# Patient Record
Sex: Female | Born: 1995 | Race: White | Hispanic: No | Marital: Married | Smoking: Never smoker
Health system: Southern US, Community
[De-identification: ages and names within clinical notes are randomized; demographics above are authoritative.]

## PROBLEM LIST (undated history)

## (undated) DIAGNOSIS — N809 Endometriosis, unspecified: Secondary | ICD-10-CM

## (undated) DIAGNOSIS — M1990 Unspecified osteoarthritis, unspecified site: Secondary | ICD-10-CM

## (undated) DIAGNOSIS — F32A Depression, unspecified: Secondary | ICD-10-CM

## (undated) DIAGNOSIS — E039 Hypothyroidism, unspecified: Secondary | ICD-10-CM

## (undated) DIAGNOSIS — F419 Anxiety disorder, unspecified: Secondary | ICD-10-CM

## (undated) DIAGNOSIS — N390 Urinary tract infection, site not specified: Secondary | ICD-10-CM

## (undated) DIAGNOSIS — K449 Diaphragmatic hernia without obstruction or gangrene: Secondary | ICD-10-CM

## (undated) DIAGNOSIS — D649 Anemia, unspecified: Secondary | ICD-10-CM

## (undated) DIAGNOSIS — G932 Benign intracranial hypertension: Secondary | ICD-10-CM

## (undated) DIAGNOSIS — F4310 Post-traumatic stress disorder, unspecified: Secondary | ICD-10-CM

## (undated) DIAGNOSIS — K219 Gastro-esophageal reflux disease without esophagitis: Secondary | ICD-10-CM

## (undated) DIAGNOSIS — J45909 Unspecified asthma, uncomplicated: Secondary | ICD-10-CM

## (undated) DIAGNOSIS — I10 Essential (primary) hypertension: Secondary | ICD-10-CM

## (undated) DIAGNOSIS — F909 Attention-deficit hyperactivity disorder, unspecified type: Secondary | ICD-10-CM

## (undated) DIAGNOSIS — T7840XA Allergy, unspecified, initial encounter: Secondary | ICD-10-CM

## (undated) DIAGNOSIS — Z8659 Personal history of other mental and behavioral disorders: Secondary | ICD-10-CM

## (undated) DIAGNOSIS — O139 Gestational [pregnancy-induced] hypertension without significant proteinuria, unspecified trimester: Secondary | ICD-10-CM

## (undated) DIAGNOSIS — L983 Eosinophilic cellulitis [Wells]: Secondary | ICD-10-CM

## (undated) HISTORY — PX: ESOPHAGOGASTRODUODENOSCOPY: SHX1529

## (undated) HISTORY — PX: FINGER SURGERY: SHX640

## (undated) HISTORY — DX: Allergy, unspecified, initial encounter: T78.40XA

## (undated) HISTORY — DX: Depression, unspecified: F32.A

## (undated) HISTORY — DX: Gestational (pregnancy-induced) hypertension without significant proteinuria, unspecified trimester: O13.9

## (undated) HISTORY — PX: DILATION AND CURETTAGE OF UTERUS: SHX78

## (undated) HISTORY — DX: Unspecified osteoarthritis, unspecified site: M19.90

## (undated) HISTORY — PX: COLONOSCOPY: SHX174

## (undated) HISTORY — DX: Post-traumatic stress disorder, unspecified: F43.10

## (undated) HISTORY — DX: Attention-deficit hyperactivity disorder, unspecified type: F90.9

## (undated) HISTORY — DX: Anemia, unspecified: D64.9

## (undated) HISTORY — DX: Personal history of other mental and behavioral disorders: Z86.59

## (undated) HISTORY — DX: Endometriosis, unspecified: N80.9

## (undated) HISTORY — DX: Anxiety disorder, unspecified: F41.9

## (undated) HISTORY — DX: Gastro-esophageal reflux disease without esophagitis: K21.9

## (undated) HISTORY — PX: LUMBAR PUNCTURE: SHX1985

## (undated) HISTORY — PX: LAPAROTOMY: SHX154

## (undated) HISTORY — DX: Unspecified asthma, uncomplicated: J45.909

---
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
## 2019-10-27 DIAGNOSIS — Z1151 Encounter for screening for human papillomavirus (HPV): Secondary | ICD-10-CM | POA: Diagnosis not present

## 2019-10-27 DIAGNOSIS — Z309 Encounter for contraceptive management, unspecified: Secondary | ICD-10-CM | POA: Diagnosis not present

## 2019-10-27 DIAGNOSIS — N898 Other specified noninflammatory disorders of vagina: Secondary | ICD-10-CM | POA: Diagnosis not present

## 2019-10-27 DIAGNOSIS — Z01419 Encounter for gynecological examination (general) (routine) without abnormal findings: Secondary | ICD-10-CM | POA: Diagnosis not present

## 2019-10-30 DIAGNOSIS — N898 Other specified noninflammatory disorders of vagina: Secondary | ICD-10-CM | POA: Diagnosis not present

## 2019-12-13 DIAGNOSIS — J111 Influenza due to unidentified influenza virus with other respiratory manifestations: Secondary | ICD-10-CM | POA: Diagnosis not present

## 2019-12-13 DIAGNOSIS — R5383 Other fatigue: Secondary | ICD-10-CM | POA: Diagnosis not present

## 2019-12-13 DIAGNOSIS — J029 Acute pharyngitis, unspecified: Secondary | ICD-10-CM | POA: Diagnosis not present

## 2019-12-13 DIAGNOSIS — B349 Viral infection, unspecified: Secondary | ICD-10-CM | POA: Diagnosis not present

## 2019-12-13 DIAGNOSIS — Z20828 Contact with and (suspected) exposure to other viral communicable diseases: Secondary | ICD-10-CM | POA: Diagnosis not present

## 2019-12-23 DIAGNOSIS — J9811 Atelectasis: Secondary | ICD-10-CM | POA: Diagnosis not present

## 2019-12-23 DIAGNOSIS — R05 Cough: Secondary | ICD-10-CM | POA: Diagnosis not present

## 2019-12-23 DIAGNOSIS — R918 Other nonspecific abnormal finding of lung field: Secondary | ICD-10-CM | POA: Diagnosis not present

## 2019-12-23 DIAGNOSIS — R509 Fever, unspecified: Secondary | ICD-10-CM | POA: Diagnosis not present

## 2019-12-23 DIAGNOSIS — M5489 Other dorsalgia: Secondary | ICD-10-CM | POA: Diagnosis not present

## 2019-12-27 DIAGNOSIS — N949 Unspecified condition associated with female genital organs and menstrual cycle: Secondary | ICD-10-CM | POA: Diagnosis not present

## 2019-12-27 DIAGNOSIS — R8271 Bacteriuria: Secondary | ICD-10-CM | POA: Diagnosis not present

## 2019-12-27 DIAGNOSIS — R918 Other nonspecific abnormal finding of lung field: Secondary | ICD-10-CM | POA: Diagnosis not present

## 2020-01-30 DIAGNOSIS — R05 Cough: Secondary | ICD-10-CM | POA: Diagnosis not present

## 2020-01-30 DIAGNOSIS — M549 Dorsalgia, unspecified: Secondary | ICD-10-CM | POA: Diagnosis not present

## 2020-01-30 DIAGNOSIS — Z131 Encounter for screening for diabetes mellitus: Secondary | ICD-10-CM | POA: Diagnosis not present

## 2020-01-30 DIAGNOSIS — R002 Palpitations: Secondary | ICD-10-CM | POA: Diagnosis not present

## 2020-01-30 DIAGNOSIS — R0602 Shortness of breath: Secondary | ICD-10-CM | POA: Diagnosis not present

## 2020-02-16 DIAGNOSIS — N898 Other specified noninflammatory disorders of vagina: Secondary | ICD-10-CM | POA: Diagnosis not present

## 2020-02-16 DIAGNOSIS — R309 Painful micturition, unspecified: Secondary | ICD-10-CM | POA: Diagnosis not present

## 2020-08-01 DIAGNOSIS — R509 Fever, unspecified: Secondary | ICD-10-CM | POA: Diagnosis not present

## 2020-08-15 DIAGNOSIS — N925 Other specified irregular menstruation: Secondary | ICD-10-CM | POA: Diagnosis not present

## 2020-08-15 DIAGNOSIS — E039 Hypothyroidism, unspecified: Secondary | ICD-10-CM | POA: Diagnosis not present

## 2020-08-15 DIAGNOSIS — R110 Nausea: Secondary | ICD-10-CM | POA: Diagnosis not present

## 2020-08-15 DIAGNOSIS — Z331 Pregnant state, incidental: Secondary | ICD-10-CM | POA: Diagnosis not present

## 2020-09-03 DIAGNOSIS — O99211 Obesity complicating pregnancy, first trimester: Secondary | ICD-10-CM | POA: Diagnosis not present

## 2020-09-04 DIAGNOSIS — Z3401 Encounter for supervision of normal first pregnancy, first trimester: Secondary | ICD-10-CM | POA: Diagnosis not present

## 2020-09-04 DIAGNOSIS — Z3687 Encounter for antenatal screening for uncertain dates: Secondary | ICD-10-CM | POA: Diagnosis not present

## 2020-09-04 DIAGNOSIS — O99211 Obesity complicating pregnancy, first trimester: Secondary | ICD-10-CM | POA: Diagnosis not present

## 2020-09-04 DIAGNOSIS — Z3A01 Less than 8 weeks gestation of pregnancy: Secondary | ICD-10-CM | POA: Diagnosis not present

## 2020-09-04 DIAGNOSIS — O200 Threatened abortion: Secondary | ICD-10-CM | POA: Diagnosis not present

## 2020-09-06 DIAGNOSIS — O200 Threatened abortion: Secondary | ICD-10-CM | POA: Diagnosis not present

## 2020-09-11 DIAGNOSIS — O200 Threatened abortion: Secondary | ICD-10-CM | POA: Diagnosis not present

## 2020-09-11 DIAGNOSIS — Z3687 Encounter for antenatal screening for uncertain dates: Secondary | ICD-10-CM | POA: Diagnosis not present

## 2020-09-11 DIAGNOSIS — O020 Blighted ovum and nonhydatidiform mole: Secondary | ICD-10-CM | POA: Diagnosis not present

## 2020-09-11 DIAGNOSIS — O021 Missed abortion: Secondary | ICD-10-CM | POA: Diagnosis not present

## 2020-09-12 DIAGNOSIS — Z01818 Encounter for other preprocedural examination: Secondary | ICD-10-CM | POA: Diagnosis not present

## 2020-09-12 DIAGNOSIS — Z20822 Contact with and (suspected) exposure to covid-19: Secondary | ICD-10-CM | POA: Diagnosis not present

## 2020-09-12 DIAGNOSIS — O020 Blighted ovum and nonhydatidiform mole: Secondary | ICD-10-CM | POA: Diagnosis not present

## 2020-09-12 DIAGNOSIS — O021 Missed abortion: Secondary | ICD-10-CM | POA: Diagnosis not present

## 2020-09-12 DIAGNOSIS — Z3A01 Less than 8 weeks gestation of pregnancy: Secondary | ICD-10-CM | POA: Diagnosis not present

## 2020-09-13 DIAGNOSIS — Z01818 Encounter for other preprocedural examination: Secondary | ICD-10-CM | POA: Diagnosis not present

## 2020-09-13 DIAGNOSIS — Z20822 Contact with and (suspected) exposure to covid-19: Secondary | ICD-10-CM | POA: Diagnosis not present

## 2020-09-13 DIAGNOSIS — I10 Essential (primary) hypertension: Secondary | ICD-10-CM | POA: Diagnosis not present

## 2020-09-13 DIAGNOSIS — O029 Abnormal product of conception, unspecified: Secondary | ICD-10-CM | POA: Diagnosis not present

## 2020-09-13 DIAGNOSIS — E669 Obesity, unspecified: Secondary | ICD-10-CM | POA: Diagnosis not present

## 2020-09-13 DIAGNOSIS — O021 Missed abortion: Secondary | ICD-10-CM | POA: Diagnosis not present

## 2020-09-13 DIAGNOSIS — Z3A01 Less than 8 weeks gestation of pregnancy: Secondary | ICD-10-CM | POA: Diagnosis not present

## 2020-09-17 DIAGNOSIS — Y92410 Unspecified street and highway as the place of occurrence of the external cause: Secondary | ICD-10-CM | POA: Diagnosis not present

## 2020-09-17 DIAGNOSIS — R52 Pain, unspecified: Secondary | ICD-10-CM | POA: Diagnosis not present

## 2020-09-17 DIAGNOSIS — R1084 Generalized abdominal pain: Secondary | ICD-10-CM | POA: Diagnosis not present

## 2020-09-17 DIAGNOSIS — M25522 Pain in left elbow: Secondary | ICD-10-CM | POA: Diagnosis not present

## 2020-09-17 DIAGNOSIS — M25561 Pain in right knee: Secondary | ICD-10-CM | POA: Diagnosis not present

## 2020-09-19 DIAGNOSIS — R1030 Lower abdominal pain, unspecified: Secondary | ICD-10-CM | POA: Diagnosis not present

## 2020-10-07 DIAGNOSIS — M25562 Pain in left knee: Secondary | ICD-10-CM | POA: Diagnosis not present

## 2020-10-07 DIAGNOSIS — M545 Low back pain, unspecified: Secondary | ICD-10-CM | POA: Diagnosis not present

## 2020-10-07 DIAGNOSIS — S3992XD Unspecified injury of lower back, subsequent encounter: Secondary | ICD-10-CM | POA: Diagnosis not present

## 2020-10-23 DIAGNOSIS — N946 Dysmenorrhea, unspecified: Secondary | ICD-10-CM | POA: Diagnosis not present

## 2020-10-23 DIAGNOSIS — N939 Abnormal uterine and vaginal bleeding, unspecified: Secondary | ICD-10-CM | POA: Diagnosis not present

## 2020-10-27 DIAGNOSIS — T63301A Toxic effect of unspecified spider venom, accidental (unintentional), initial encounter: Secondary | ICD-10-CM | POA: Diagnosis not present

## 2020-10-27 DIAGNOSIS — Z9181 History of falling: Secondary | ICD-10-CM | POA: Diagnosis not present

## 2020-11-07 DIAGNOSIS — Z136 Encounter for screening for cardiovascular disorders: Secondary | ICD-10-CM | POA: Diagnosis not present

## 2020-11-07 DIAGNOSIS — M545 Low back pain, unspecified: Secondary | ICD-10-CM | POA: Diagnosis not present

## 2020-11-07 DIAGNOSIS — M542 Cervicalgia: Secondary | ICD-10-CM | POA: Diagnosis not present

## 2020-11-07 DIAGNOSIS — F418 Other specified anxiety disorders: Secondary | ICD-10-CM | POA: Diagnosis not present

## 2020-11-07 DIAGNOSIS — Z131 Encounter for screening for diabetes mellitus: Secondary | ICD-10-CM | POA: Diagnosis not present

## 2020-11-07 DIAGNOSIS — M546 Pain in thoracic spine: Secondary | ICD-10-CM | POA: Diagnosis not present

## 2020-12-17 DIAGNOSIS — O99211 Obesity complicating pregnancy, first trimester: Secondary | ICD-10-CM | POA: Diagnosis not present

## 2020-12-17 DIAGNOSIS — N912 Amenorrhea, unspecified: Secondary | ICD-10-CM | POA: Diagnosis not present

## 2020-12-28 ENCOUNTER — Other Ambulatory Visit: Payer: Self-pay

## 2020-12-28 ENCOUNTER — Inpatient Hospital Stay (HOSPITAL_COMMUNITY)
Admission: AD | Admit: 2020-12-28 | Discharge: 2020-12-28 | Disposition: A | Discharge status: Home / Self Care | Payer: BC Managed Care – PPO | Attending: Obstetrics and Gynecology | Admitting: Obstetrics and Gynecology

## 2020-12-28 ENCOUNTER — Encounter (HOSPITAL_COMMUNITY): Payer: Self-pay | Admitting: Obstetrics and Gynecology

## 2020-12-28 ENCOUNTER — Inpatient Hospital Stay (HOSPITAL_COMMUNITY): Payer: BC Managed Care – PPO

## 2020-12-28 DIAGNOSIS — O99891 Other specified diseases and conditions complicating pregnancy: Secondary | ICD-10-CM | POA: Diagnosis not present

## 2020-12-28 DIAGNOSIS — R109 Unspecified abdominal pain: Secondary | ICD-10-CM | POA: Insufficient documentation

## 2020-12-28 DIAGNOSIS — O99281 Endocrine, nutritional and metabolic diseases complicating pregnancy, first trimester: Secondary | ICD-10-CM | POA: Insufficient documentation

## 2020-12-28 DIAGNOSIS — O3680X0 Pregnancy with inconclusive fetal viability, not applicable or unspecified: Secondary | ICD-10-CM

## 2020-12-28 DIAGNOSIS — N854 Malposition of uterus: Secondary | ICD-10-CM | POA: Diagnosis not present

## 2020-12-28 DIAGNOSIS — O26851 Spotting complicating pregnancy, first trimester: Secondary | ICD-10-CM | POA: Insufficient documentation

## 2020-12-28 DIAGNOSIS — Z79899 Other long term (current) drug therapy: Secondary | ICD-10-CM | POA: Insufficient documentation

## 2020-12-28 DIAGNOSIS — Z3A01 Less than 8 weeks gestation of pregnancy: Secondary | ICD-10-CM | POA: Diagnosis not present

## 2020-12-28 DIAGNOSIS — O209 Hemorrhage in early pregnancy, unspecified: Secondary | ICD-10-CM | POA: Diagnosis not present

## 2020-12-28 DIAGNOSIS — E039 Hypothyroidism, unspecified: Secondary | ICD-10-CM | POA: Diagnosis not present

## 2020-12-28 HISTORY — DX: Eosinophilic cellulitis (wells): L98.3

## 2020-12-28 HISTORY — DX: Benign intracranial hypertension: G93.2

## 2020-12-28 HISTORY — DX: Hypothyroidism, unspecified: E03.9

## 2020-12-28 LAB — URINALYSIS, ROUTINE W REFLEX MICROSCOPIC
Bilirubin Urine: NEGATIVE
Glucose, UA: NEGATIVE mg/dL
Ketones, ur: NEGATIVE mg/dL
Leukocytes,Ua: NEGATIVE
Nitrite: NEGATIVE
Protein, ur: NEGATIVE mg/dL
Specific Gravity, Urine: >1.03 — ABNORMAL HIGH (ref 1.005–1.030)
pH: 5.5 (ref 5.0–8.0)

## 2020-12-28 LAB — CBC
HCT: 39.1 % (ref 36.0–46.0)
Hemoglobin: 13.2 g/dL (ref 12.0–15.0)
MCH: 30.1 pg (ref 26.0–34.0)
MCHC: 33.8 g/dL (ref 30.0–36.0)
MCV: 89.1 fL (ref 80.0–100.0)
Platelets: 346 10*3/uL (ref 150–400)
RBC: 4.39 MIL/uL (ref 3.87–5.11)
RDW: 12.8 % (ref 11.5–15.5)
WBC: 7.8 10*3/uL (ref 4.0–10.5)
nRBC: 0 % (ref 0.0–0.2)

## 2020-12-28 LAB — WET PREP, GENITAL
Clue Cells Wet Prep HPF POC: NONE SEEN
Sperm: NONE SEEN
Trich, Wet Prep: NONE SEEN
Yeast Wet Prep HPF POC: NONE SEEN

## 2020-12-28 LAB — URINALYSIS, MICROSCOPIC (REFLEX)

## 2020-12-28 LAB — POCT PREGNANCY, URINE: Preg Test, Ur: POSITIVE — AB

## 2020-12-28 LAB — HCG, QUANTITATIVE, PREGNANCY: hCG, Beta Chain, Quant, S: 13453 m[IU]/mL — ABNORMAL HIGH (ref <5)

## 2020-12-28 MED ORDER — ACETAMINOPHEN 500 MG PO TABS
1000.0000 mg | ORAL_TABLET | Freq: Once | ORAL | Status: AC
  Administered 2020-12-28: 1000 mg via ORAL
  Filled 2020-12-28: qty 2

## 2020-12-28 NOTE — MAU Provider Note (Signed)
History     CSN: [id number]  Arrival date and time: 12/28/20 0200   Event Date/Time   First Provider Initiated Contact with Patient 12/28/20 0249      Chief Complaint  Patient presents with   Vaginal Bleeding   Abdominal Pain   Marcia Gonzalez is a 25 y.o. G2P0010 at [redacted]w[redacted]d by Unsure LMP of April 27th, 2022 who receives care at [facility] in [place].  She presents today for Vaginal Bleeding and Abdominal Pain.  She states she has been cramping for about one week and states the pain is intermittent and located in the lower abdominal area-bilaterally.  Patient rates the pain at 3 out of 10.  She states she started having bleeding yesterday initially was spotting and only noted with wiping, but has since increased and is now her underwear.  She denies sexual activity in the past 3 days.  She denies issues with urination or vaginal discharge prior to the bleeding.  She does endorse diarrhea in the form of loose stools, however she has not had one since Thursday.   OB History     Gravida  2   Para      Term      Preterm      AB  1   Living         SAB  1   IAB      Ectopic      Multiple      Live Births              Past Medical History:  Diagnosis Date   Hypothyroidism    IIH (idiopathic intracranial hypertension)    Wells' syndrome     Past Surgical History:  Procedure Laterality Date   DILATION AND CURETTAGE OF UTERUS     FINGER SURGERY      No family history on file.     Allergies:  Allergies  Allergen Reactions   Codeine Nausea Only   Augmentin [Amoxicillin-Pot Clavulanate] Other (See Comments)    Hallucinations    Medications Prior to Admission  Medication Sig Dispense Refill Last Dose   buPROPion (WELLBUTRIN) 75 MG tablet Take 75 mg by mouth daily.      escitalopram (LEXAPRO) 10 MG tablet Take 10 mg by mouth daily.      ferrous sulfate 325 (65 FE) MG tablet Take 325 mg by mouth daily with breakfast.      folic acid (FOLVITE) 1 MG  tablet Take 1 mg by mouth daily.      levothyroxine (SYNTHROID) 88 MCG tablet Take 88 mcg by mouth daily before breakfast.   12/27/2020   liothyronine (CYTOMEL) 5 MCG tablet Take 5 mcg by mouth daily.      Prenatal Vit-Fe Fumarate-FA (PRENATAL MULTIVITAMIN) TABS tablet Take 1 tablet by mouth daily at 12 noon.       Review of Systems  Gastrointestinal:  Positive for abdominal pain (Cramping) and diarrhea (Loose-None currently). Negative for constipation, nausea and vomiting.  Genitourinary:  Positive for vaginal bleeding (Spotting). Negative for difficulty urinating, dysuria and vaginal discharge.  Musculoskeletal:  Positive for back pain.  Neurological:  Negative for dizziness, light-headedness and headaches.  Physical Exam   Blood pressure 116/68, pulse 92, temperature 97.6 F (36.4 C), resp. rate 18, height 5\' 6"  (1.676 m), weight 106.6 kg, last menstrual period 11/13/2020.  Physical Exam Vitals reviewed.  Constitutional:      General: She is not in acute distress.    Appearance: Normal appearance.  She is well-developed. She is obese. She is not ill-appearing.  HENT:     Head: Normocephalic and atraumatic.  Eyes:     Conjunctiva/sclera: Conjunctivae normal.  Cardiovascular:     Rate and Rhythm: Normal rate.  Pulmonary:     Effort: Pulmonary effort is normal. No respiratory distress.  Genitourinary:    Comments: Patient self swab for collection of cultures.  Musculoskeletal:     Cervical back: Normal range of motion.  Skin:    General: Skin is warm and dry.  Neurological:     Mental Status: She is alert and oriented to person, place, and time.  Psychiatric:        Mood and Affect: Mood normal.        Behavior: Behavior normal.        Thought Content: Thought content normal.    MAU Course  Procedures Results for orders placed or performed during the hospital encounter of 12/28/20 (from the past 24 hour(s))  Urinalysis, Routine w reflex microscopic Urine, Clean Catch      Status: Abnormal   Collection Time: 12/28/20  2:22 AM  Result Value Ref Range   Color, Urine YELLOW YELLOW   APPearance HAZY (A) CLEAR   Specific Gravity, Urine >1.030 (H) 1.005 - 1.030   pH 5.5 5.0 - 8.0   Glucose, UA NEGATIVE NEGATIVE mg/dL   Hgb urine dipstick MODERATE (A) NEGATIVE   Bilirubin Urine NEGATIVE NEGATIVE   Ketones, ur NEGATIVE NEGATIVE mg/dL   Protein, ur NEGATIVE NEGATIVE mg/dL   Nitrite NEGATIVE NEGATIVE   Leukocytes,Ua NEGATIVE NEGATIVE  Urinalysis, Microscopic (reflex)     Status: Abnormal   Collection Time: 12/28/20  2:22 AM  Result Value Ref Range   RBC / HPF 0-5 0 - 5 RBC/hpf   WBC, UA 0-5 0 - 5 WBC/hpf   Bacteria, UA FEW (A) NONE SEEN   Squamous Epithelial / LPF 6-10 0 - 5   Mucus PRESENT    Ca Oxalate Crys, UA PRESENT   Pregnancy, urine POC     Status: Abnormal   Collection Time: 12/28/20  2:29 AM  Result Value Ref Range   Preg Test, Ur POSITIVE (A) NEGATIVE  CBC     Status: None   Collection Time: 12/28/20  3:14 AM  Result Value Ref Range   WBC 7.8 4.0 - 10.5 K/uL   RBC 4.39 3.87 - 5.11 MIL/uL   Hemoglobin 13.2 12.0 - 15.0 g/dL   HCT 27.7 82.4 - 23.5 %   MCV 89.1 80.0 - 100.0 fL   MCH 30.1 26.0 - 34.0 pg   MCHC 33.8 30.0 - 36.0 g/dL   RDW 36.1 44.3 - 15.4 %   Platelets 346 150 - 400 K/uL   nRBC 0.0 0.0 - 0.2 %  hCG, quantitative, pregnancy     Status: Abnormal   Collection Time: 12/28/20  3:14 AM  Result Value Ref Range   hCG, Beta Chain, Quant, S 13,453 (H) <5 mIU/mL  ABO/Rh     Status: None   Collection Time: 12/28/20  3:14 AM  Result Value Ref Range   ABO/RH(D)      O POS Performed at [facility], [address] [place], [place] [address]   Wet prep, genital     Status: Abnormal   Collection Time: 12/28/20  3:32 AM   Specimen: PATH Cytology Cervicovaginal Ancillary Only  Result Value Ref Range   Yeast Wet Prep HPF POC NONE SEEN NONE SEEN   Trich,  Wet Prep NONE SEEN NONE SEEN   Clue Cells Wet Prep HPF POC NONE SEEN NONE SEEN    WBC, Wet Prep HPF POC MANY (A) NONE SEEN   Sperm NONE SEEN    US OB LESS THAN 14 WEEKS WITH OB TRANSVAGINAL  Result Date: 12/28/2020 CLINICAL DATA:  Cramps for 1 week, spotting EXAM: OBSTETRIC <14 WK [place] AND TRANSVAGINAL OB US TECHNIQUE: Both transabdominal and transvaginal ultrasound examinations were performed for complete evaluation of the gestation as well as the maternal uterus, adnexal regions, and pelvic cul-de-sac. Transvaginal technique was performed to assess early pregnancy. COMPARISON:  None. FINDINGS: Intrauterine gestational sac: Single Yolk sac:  Not Visualized. Embryo:  Not Visualized. Cardiac Activity: Not Visualized. MSD: 13 mm   6 w   1 d Subchorionic hemorrhage:  None visualized. Maternal uterus/adnexae: Focal fluid collection within the endometrium likely reflecting early gestational sac. Retroflexed uterus. Challenging visualization of the pelvic contents due to extensive bowel gas with nonvisualization of the ovaries. No discernible pelvic free fluid. IMPRESSION: Probable early intrauterine gestational sac, but no yolk sac, fetal pole, or cardiac activity yet visualized. Recommend follow-up quantitative B-HCG levels and follow-up US in 14 days to assess viability. This recommendation follows SRU consensus guidelines: Diagnostic Criteria for Nonviable Pregnancy Early in the First Trimester. Malva Limes Med 2013; 643:3295-18. Nonvisualization of the ovaries. Retroflexed uterus. Electronically Signed   By: Kreg Shropshire M.D.   On: 12/28/2020 03:45    MDM Pelvic Exam; Wet Prep and GC/CT Labs: UA, UPT, CBC, hCG, ABO Ultrasound Analgesic Assessment and Plan  25 year old G2P0010 at 6.3 weeks Spotting Abdominal Pain  -Reviewed POC with patient. -Patient to self swab for cultures. -Patient offered and declined pain medication. -Labs ordered and collected. -We will send for ultrasound results.   Cherre Robins 12/28/2020, 2:49 AM   Reassessment (4:47 AM) IUGS    -Ultrasound  findings as above. -Quant [place]. -Provider to bedside to discuss results. -Patient informed of need for follow-up appointment in 48 hours. -Patient expressed desire to follow-up with primary OB at [facility]. -Patient and significant other questions and concerns addressed. -Bleeding precautions given -Encouraged to call or return to MAU if symptoms worsen or with the onset of new symptoms. -Discharged to home in stable condition.  Cherre Robins .cre

## 2020-12-28 NOTE — MAU Note (Signed)
I had a miscarriage in the past with D&C. Have had back pain and abd cramping for a wk. Diarrhea for 3wks since finding out was pregnant. Spotting off and on this wk which has gotten alittle heavier. Occ n/v. Just want to be sure everything is ok.

## 2020-12-30 DIAGNOSIS — O200 Threatened abortion: Secondary | ICD-10-CM | POA: Diagnosis not present

## 2020-12-30 DIAGNOSIS — R112 Nausea with vomiting, unspecified: Secondary | ICD-10-CM | POA: Diagnosis not present

## 2020-12-30 LAB — GC/CHLAMYDIA PROBE AMP (~~LOC~~) NOT AT ARMC
Chlamydia: NEGATIVE
Comment: NEGATIVE
Comment: NORMAL
Neisseria Gonorrhea: NEGATIVE

## 2020-12-30 LAB — ABO/RH: ABO/RH(D): O POS

## 2021-01-01 DIAGNOSIS — O200 Threatened abortion: Secondary | ICD-10-CM | POA: Diagnosis not present

## 2021-01-05 DIAGNOSIS — Z8759 Personal history of other complications of pregnancy, childbirth and the puerperium: Secondary | ICD-10-CM | POA: Diagnosis not present

## 2021-01-05 DIAGNOSIS — N939 Abnormal uterine and vaginal bleeding, unspecified: Secondary | ICD-10-CM | POA: Diagnosis not present

## 2021-01-05 DIAGNOSIS — Z3A01 Less than 8 weeks gestation of pregnancy: Secondary | ICD-10-CM | POA: Diagnosis not present

## 2021-01-05 DIAGNOSIS — N938 Other specified abnormal uterine and vaginal bleeding: Secondary | ICD-10-CM | POA: Diagnosis not present

## 2021-01-05 DIAGNOSIS — R1030 Lower abdominal pain, unspecified: Secondary | ICD-10-CM | POA: Diagnosis not present

## 2021-01-05 DIAGNOSIS — O034 Incomplete spontaneous abortion without complication: Secondary | ICD-10-CM | POA: Diagnosis not present

## 2021-01-05 DIAGNOSIS — O209 Hemorrhage in early pregnancy, unspecified: Secondary | ICD-10-CM | POA: Diagnosis not present

## 2021-01-05 DIAGNOSIS — R102 Pelvic and perineal pain: Secondary | ICD-10-CM | POA: Diagnosis not present

## 2021-01-05 DIAGNOSIS — O26891 Other specified pregnancy related conditions, first trimester: Secondary | ICD-10-CM | POA: Diagnosis not present

## 2021-01-05 DIAGNOSIS — O99281 Endocrine, nutritional and metabolic diseases complicating pregnancy, first trimester: Secondary | ICD-10-CM | POA: Diagnosis not present

## 2021-01-05 DIAGNOSIS — R7989 Other specified abnormal findings of blood chemistry: Secondary | ICD-10-CM | POA: Diagnosis not present

## 2021-01-05 DIAGNOSIS — R55 Syncope and collapse: Secondary | ICD-10-CM | POA: Diagnosis not present

## 2021-01-05 DIAGNOSIS — R457 State of emotional shock and stress, unspecified: Secondary | ICD-10-CM | POA: Diagnosis not present

## 2021-01-05 DIAGNOSIS — M5459 Other low back pain: Secondary | ICD-10-CM | POA: Diagnosis not present

## 2021-01-05 DIAGNOSIS — O99891 Other specified diseases and conditions complicating pregnancy: Secondary | ICD-10-CM | POA: Diagnosis not present

## 2021-01-05 DIAGNOSIS — Z20822 Contact with and (suspected) exposure to covid-19: Secondary | ICD-10-CM | POA: Diagnosis not present

## 2021-01-05 DIAGNOSIS — O3680X0 Pregnancy with inconclusive fetal viability, not applicable or unspecified: Secondary | ICD-10-CM | POA: Diagnosis not present

## 2021-01-05 DIAGNOSIS — O039 Complete or unspecified spontaneous abortion without complication: Secondary | ICD-10-CM | POA: Diagnosis not present

## 2021-01-05 DIAGNOSIS — E876 Hypokalemia: Secondary | ICD-10-CM | POA: Diagnosis not present

## 2021-01-05 DIAGNOSIS — R0689 Other abnormalities of breathing: Secondary | ICD-10-CM | POA: Diagnosis not present

## 2021-01-05 DIAGNOSIS — Z3A00 Weeks of gestation of pregnancy not specified: Secondary | ICD-10-CM | POA: Diagnosis not present

## 2021-01-05 DIAGNOSIS — Z3201 Encounter for pregnancy test, result positive: Secondary | ICD-10-CM | POA: Diagnosis not present

## 2021-01-05 DIAGNOSIS — R000 Tachycardia, unspecified: Secondary | ICD-10-CM | POA: Diagnosis not present

## 2021-01-06 DIAGNOSIS — R0689 Other abnormalities of breathing: Secondary | ICD-10-CM | POA: Diagnosis not present

## 2021-01-06 DIAGNOSIS — O029 Abnormal product of conception, unspecified: Secondary | ICD-10-CM | POA: Diagnosis not present

## 2021-01-06 DIAGNOSIS — O034 Incomplete spontaneous abortion without complication: Secondary | ICD-10-CM | POA: Diagnosis not present

## 2021-01-06 DIAGNOSIS — O039 Complete or unspecified spontaneous abortion without complication: Secondary | ICD-10-CM | POA: Diagnosis not present

## 2021-01-06 DIAGNOSIS — F32A Depression, unspecified: Secondary | ICD-10-CM | POA: Diagnosis not present

## 2021-01-06 DIAGNOSIS — R102 Pelvic and perineal pain: Secondary | ICD-10-CM | POA: Diagnosis not present

## 2021-01-06 DIAGNOSIS — R000 Tachycardia, unspecified: Secondary | ICD-10-CM | POA: Diagnosis not present

## 2021-01-06 DIAGNOSIS — R109 Unspecified abdominal pain: Secondary | ICD-10-CM | POA: Diagnosis not present

## 2021-01-07 DIAGNOSIS — R102 Pelvic and perineal pain: Secondary | ICD-10-CM | POA: Diagnosis not present

## 2021-01-07 DIAGNOSIS — O034 Incomplete spontaneous abortion without complication: Secondary | ICD-10-CM | POA: Diagnosis not present

## 2021-01-17 DIAGNOSIS — R102 Pelvic and perineal pain: Secondary | ICD-10-CM | POA: Diagnosis not present

## 2021-01-17 DIAGNOSIS — N946 Dysmenorrhea, unspecified: Secondary | ICD-10-CM | POA: Diagnosis not present

## 2021-01-31 DIAGNOSIS — N96 Recurrent pregnancy loss: Secondary | ICD-10-CM | POA: Diagnosis not present

## 2021-01-31 DIAGNOSIS — R102 Pelvic and perineal pain: Secondary | ICD-10-CM | POA: Diagnosis not present

## 2021-01-31 DIAGNOSIS — N946 Dysmenorrhea, unspecified: Secondary | ICD-10-CM | POA: Diagnosis not present

## 2021-03-14 DIAGNOSIS — Z01818 Encounter for other preprocedural examination: Secondary | ICD-10-CM | POA: Diagnosis not present

## 2021-03-14 DIAGNOSIS — N96 Recurrent pregnancy loss: Secondary | ICD-10-CM | POA: Diagnosis not present

## 2021-03-14 DIAGNOSIS — R102 Pelvic and perineal pain: Secondary | ICD-10-CM | POA: Diagnosis not present

## 2021-03-14 DIAGNOSIS — N946 Dysmenorrhea, unspecified: Secondary | ICD-10-CM | POA: Diagnosis not present

## 2021-03-17 DIAGNOSIS — R102 Pelvic and perineal pain: Secondary | ICD-10-CM | POA: Diagnosis not present

## 2021-03-17 DIAGNOSIS — Z01812 Encounter for preprocedural laboratory examination: Secondary | ICD-10-CM | POA: Diagnosis not present

## 2021-03-18 DIAGNOSIS — R102 Pelvic and perineal pain: Secondary | ICD-10-CM | POA: Diagnosis not present

## 2021-03-18 DIAGNOSIS — Z8616 Personal history of COVID-19: Secondary | ICD-10-CM | POA: Diagnosis not present

## 2021-03-18 DIAGNOSIS — N736 Female pelvic peritoneal adhesions (postinfective): Secondary | ICD-10-CM | POA: Diagnosis not present

## 2021-03-18 DIAGNOSIS — I10 Essential (primary) hypertension: Secondary | ICD-10-CM | POA: Diagnosis not present

## 2021-03-18 DIAGNOSIS — G8929 Other chronic pain: Secondary | ICD-10-CM | POA: Diagnosis not present

## 2021-03-18 DIAGNOSIS — N946 Dysmenorrhea, unspecified: Secondary | ICD-10-CM | POA: Diagnosis not present

## 2021-03-18 DIAGNOSIS — K219 Gastro-esophageal reflux disease without esophagitis: Secondary | ICD-10-CM | POA: Diagnosis not present

## 2021-03-18 DIAGNOSIS — E039 Hypothyroidism, unspecified: Secondary | ICD-10-CM | POA: Diagnosis not present

## 2021-03-18 DIAGNOSIS — N803 Endometriosis of pelvic peritoneum: Secondary | ICD-10-CM | POA: Diagnosis not present

## 2021-03-18 DIAGNOSIS — F418 Other specified anxiety disorders: Secondary | ICD-10-CM | POA: Diagnosis not present

## 2021-03-18 DIAGNOSIS — N809 Endometriosis, unspecified: Secondary | ICD-10-CM | POA: Diagnosis not present

## 2021-04-19 HISTORY — PX: ABDOMINAL EXPLORATION SURGERY: SHX538

## 2021-04-23 DIAGNOSIS — N96 Recurrent pregnancy loss: Secondary | ICD-10-CM | POA: Diagnosis not present

## 2021-04-23 DIAGNOSIS — N809 Endometriosis, unspecified: Secondary | ICD-10-CM | POA: Diagnosis not present

## 2021-04-23 DIAGNOSIS — Z1371 Encounter for nonprocreative screening for genetic disease carrier status: Secondary | ICD-10-CM | POA: Diagnosis not present

## 2021-04-23 DIAGNOSIS — Z3169 Encounter for other general counseling and advice on procreation: Secondary | ICD-10-CM | POA: Diagnosis not present

## 2021-04-28 DIAGNOSIS — Z3169 Encounter for other general counseling and advice on procreation: Secondary | ICD-10-CM | POA: Diagnosis not present

## 2021-04-28 DIAGNOSIS — O3680X0 Pregnancy with inconclusive fetal viability, not applicable or unspecified: Secondary | ICD-10-CM | POA: Diagnosis not present

## 2021-04-28 DIAGNOSIS — Z31430 Encounter of female for testing for genetic disease carrier status for procreative management: Secondary | ICD-10-CM | POA: Diagnosis not present

## 2021-04-28 DIAGNOSIS — Z1371 Encounter for nonprocreative screening for genetic disease carrier status: Secondary | ICD-10-CM | POA: Diagnosis not present

## 2021-04-28 DIAGNOSIS — N96 Recurrent pregnancy loss: Secondary | ICD-10-CM | POA: Diagnosis not present

## 2021-05-02 DIAGNOSIS — R399 Unspecified symptoms and signs involving the genitourinary system: Secondary | ICD-10-CM | POA: Diagnosis not present

## 2021-05-02 DIAGNOSIS — E039 Hypothyroidism, unspecified: Secondary | ICD-10-CM | POA: Diagnosis not present

## 2021-05-02 DIAGNOSIS — N390 Urinary tract infection, site not specified: Secondary | ICD-10-CM | POA: Diagnosis not present

## 2021-05-02 DIAGNOSIS — Z23 Encounter for immunization: Secondary | ICD-10-CM | POA: Diagnosis not present

## 2021-05-27 DIAGNOSIS — E039 Hypothyroidism, unspecified: Secondary | ICD-10-CM | POA: Diagnosis not present

## 2021-06-04 ENCOUNTER — Ambulatory Visit: Payer: BC Managed Care – PPO | Admitting: Family Medicine

## 2021-06-18 ENCOUNTER — Ambulatory Visit: Payer: BC Managed Care – PPO | Admitting: Family Medicine

## 2021-06-30 ENCOUNTER — Other Ambulatory Visit: Payer: Self-pay

## 2021-06-30 ENCOUNTER — Ambulatory Visit (INDEPENDENT_AMBULATORY_CARE_PROVIDER_SITE_OTHER): Payer: BC Managed Care – PPO | Admitting: Family Medicine

## 2021-06-30 ENCOUNTER — Encounter: Payer: Self-pay | Admitting: Family Medicine

## 2021-06-30 VITALS — BP 109/69 | Temp 97.9°F | Ht 65.0 in | Wt 237.4 lb

## 2021-06-30 DIAGNOSIS — Z6839 Body mass index (BMI) 39.0-39.9, adult: Secondary | ICD-10-CM | POA: Insufficient documentation

## 2021-06-30 DIAGNOSIS — F331 Major depressive disorder, recurrent, moderate: Secondary | ICD-10-CM | POA: Diagnosis not present

## 2021-06-30 DIAGNOSIS — F411 Generalized anxiety disorder: Secondary | ICD-10-CM | POA: Insufficient documentation

## 2021-06-30 DIAGNOSIS — R6889 Other general symptoms and signs: Secondary | ICD-10-CM | POA: Diagnosis not present

## 2021-06-30 DIAGNOSIS — E039 Hypothyroidism, unspecified: Secondary | ICD-10-CM | POA: Diagnosis not present

## 2021-06-30 DIAGNOSIS — D649 Anemia, unspecified: Secondary | ICD-10-CM | POA: Diagnosis not present

## 2021-06-30 DIAGNOSIS — Z7689 Persons encountering health services in other specified circumstances: Secondary | ICD-10-CM

## 2021-06-30 DIAGNOSIS — F410 Panic disorder [episodic paroxysmal anxiety] without agoraphobia: Secondary | ICD-10-CM | POA: Insufficient documentation

## 2021-06-30 MED ORDER — ESCITALOPRAM OXALATE 20 MG PO TABS: 20.0000 mg | ORAL_TABLET | Freq: Every day | ORAL | 1 refills | Status: DC

## 2021-06-30 NOTE — Patient Instructions (Signed)

## 2021-06-30 NOTE — Progress Notes (Signed)
Established Patient Office Visit  Subjective:  Patient ID: Marcia Gonzalez, female    DOB: June 18, 1996  Age: 25 y.o. MRN: [id number]  CC:  Chief Complaint  Patient presents with   New Patient (Initial Visit)    HPI Marcia Gonzalez presents to establish care. She recently moved here from a few hours away. She has a history of anemia. She takes a daily iron supplement for this. She also take levothyroxine for hypothyroidism. She reports that her dosage was adjusted about 4 weeks ago. She takes wellbutrin and lexapro. She does feel like this is helpful but continues to have symptoms of anxiety and depression. She denies SI.   Depression screen PHQ 2/9 06/30/2021  Decreased Interest 1  Down, Depressed, Hopeless 2  PHQ - 2 Score 3  Altered sleeping 2  Tired, decreased energy 2  Change in appetite 1  Feeling bad or failure about yourself  1  Trouble concentrating 0  Moving slowly or fidgety/restless 1  Suicidal thoughts 0  PHQ-9 Score 10  Difficult doing work/chores Very difficult   GAD 7 : Generalized Anxiety Score 06/30/2021  Nervous, Anxious, on Edge 1  Control/stop worrying 1  Worry too much - different things 1  Trouble relaxing 1  Restless 1  Easily annoyed or irritable 1  Afraid - awful might happen 1  Total GAD 7 Score 7  Anxiety Difficulty Not difficult at all     Past Medical History:  Diagnosis Date   Allergy    Anemia    Anxiety    Arthritis    Depression    Endometriosis    GERD (gastroesophageal reflux disease)    Hypothyroidism    IIH (idiopathic intracranial hypertension)    Wells' syndrome     Past Surgical History:  Procedure Laterality Date   ABDOMINAL EXPLORATION SURGERY  04/2021   DILATION AND CURETTAGE OF UTERUS     FINGER SURGERY      Family History  Problem Relation Age of Onset   Kidney disease Mother    Hypertension Mother    Hyperlipidemia Mother    Depression Mother    Cancer Mother        cervical   Arthritis Mother     Anxiety disorder Mother    Depression Father    Anxiety disorder Father    Asthma Brother    COPD Maternal Grandmother    Arthritis Maternal Grandmother     Social History   Socioeconomic History   Marital status: Married    Spouse name: Marcia Gonzalez   Number of children: 0   Years of education: 12   Highest education level: High school graduate  Occupational History   Not on file  Tobacco Use   Smoking status: Never    Passive exposure: Never   Smokeless tobacco: Never  Vaping Use   Vaping Use: Never used  Substance and Sexual Activity   Alcohol use: Not Currently   Drug use: Never   Sexual activity: Yes    Birth control/protection: None  Other Topics Concern   Not on file  Social History Narrative   Not on file   Social Determinants of Health   Financial Resource Strain: Not on file  Food Insecurity: Not on file  Transportation Needs: Not on file  Physical Activity: Not on file  Stress: Not on file  Social Connections: Not on file  Intimate Partner Violence: Not on file    Outpatient Medications Prior to Visit  Medication Sig Dispense Refill  buPROPion (WELLBUTRIN XL) 150 MG 24 hr tablet Take 150 mg by mouth daily.     escitalopram (LEXAPRO) 10 MG tablet Take 10 mg by mouth daily.     ferrous sulfate 325 (65 FE) MG tablet Take 325 mg by mouth daily with breakfast.     folic acid (FOLVITE) 1 MG tablet Take 1 mg by mouth daily.     liothyronine (CYTOMEL) 5 MCG tablet Take 5 mcg by mouth daily.     Prenatal Vit-Fe Fumarate-FA (PRENATAL MULTIVITAMIN) TABS tablet Take 1 tablet by mouth daily at 12 noon.     pyridOXINE (VITAMIN B-6) 100 MG tablet Take 100 mg by mouth daily.     SYNTHROID 100 MCG tablet Take 100 mcg by mouth daily before breakfast.     buPROPion (WELLBUTRIN) 75 MG tablet Take 75 mg by mouth daily.     levothyroxine (SYNTHROID) 88 MCG tablet Take 88 mcg by mouth daily before breakfast.     No facility-administered medications prior to visit.     Allergies  Allergen Reactions   Codeine Nausea Only   Augmentin [Amoxicillin-Pot Clavulanate] Other (See Comments)    Hallucinations    ROS Review of Systems As per HPI.    Objective:    Physical Exam Vitals and nursing note reviewed.  Constitutional:      General: She is not in acute distress.    Appearance: She is not ill-appearing, toxic-appearing or diaphoretic.  Neck:     Thyroid: No thyroid mass, thyromegaly or thyroid tenderness.  Cardiovascular:     Rate and Rhythm: Normal rate and regular rhythm.     Heart sounds: Normal heart sounds. No murmur heard. Pulmonary:     Effort: Pulmonary effort is normal. No respiratory distress.     Breath sounds: Normal breath sounds.  Abdominal:     General: Bowel sounds are normal. There is no distension.     Palpations: Abdomen is soft.     Tenderness: There is no abdominal tenderness. There is no guarding or rebound.  Musculoskeletal:     Right lower leg: No edema.     Left lower leg: No edema.  Skin:    General: Skin is warm and dry.  Neurological:     General: No focal deficit present.     Mental Status: She is alert and oriented to person, place, and time.  Psychiatric:        Mood and Affect: Mood normal.        Behavior: Behavior normal.    Temp 97.9 F (36.6 C) (Temporal)   Ht _0  (1.651 m)   Wt 237 lb 6 oz (107.7 kg)   LMP 11/13/2020   BMI 39.50 kg/m  Wt Readings from Last 3 Encounters:  06/30/21 237 lb 6 oz (107.7 kg)  12/28/20 235 lb (106.6 kg)     There are no preventive care reminders to display for this patient.  There are no preventive care reminders to display for this patient.  No results found for: TSH Lab Results  Component Value Date   WBC 7.8 12/28/2020   HGB 13.2 12/28/2020   HCT 39.1 12/28/2020   MCV 89.1 12/28/2020   PLT 346 12/28/2020   No results found for: NA, K, CHLORIDE, CO2, GLUCOSE, BUN, CREATININE, BILITOT, ALKPHOS, AST, ALT, PROT, ALBUMIN, CALCIUM, ANIONGAP, EGFR,  GFR No results found for: CHOL No results found for: HDL No results found for: LDLCALC No results found for: TRIG No results found for: CHOLHDL No results  found for: HGBA1C    Assessment & Plan:   [name] was seen today for new patient (initial visit).  Diagnoses and all orders for this visit:  Hypothyroidism, unspecified type On synthroid. Labs pending.  -     TSH  Anemia, unspecified type On iron supplement. Labs pending.  -     Anemia Profile B -     CMP14+EGFR  Class 2 severe obesity due to excess calories with serious comorbidity and body mass index (BMI) of 39.0 to 39.9 in adult [facility]) Labs pending. Diet and exercise.  -     Anemia Profile B -     CMP14+EGFR -     TSH -     Lipid panel  Depression, major, recurrent, moderate (HCC) Not well controlled. Denies SI. Increase lexapro to 20 mg.  -     escitalopram (LEXAPRO) 20 MG tablet; Take 1 tablet (20 mg total) by mouth daily.  Generalized anxiety disorder Not well controlled. Increase lexapro.  -     escitalopram (LEXAPRO) 20 MG tablet; Take 1 tablet (20 mg total) by mouth daily.  Encounter to establish care Awaiting records.    Follow-up: Return in about 6 weeks (around 08/11/2021) for medication follow up.  The patient indicates understanding of these issues and agrees with the plan.    Gwenlyn Perking, FNP

## 2021-07-01 LAB — TSH: TSH: 0.304 u[IU]/mL — ABNORMAL LOW (ref 0.450–4.500)

## 2021-07-01 LAB — CMP14+EGFR
ALT: 17 IU/L (ref 0–32)
AST: 18 IU/L (ref 0–40)
Albumin/Globulin Ratio: 2.1 (ref 1.2–2.2)
Albumin: 4.7 g/dL (ref 3.9–5.0)
Alkaline Phosphatase: 60 IU/L (ref 44–121)
BUN/Creatinine Ratio: 13 (ref 9–23)
BUN: 10 mg/dL (ref 6–20)
Bilirubin Total: 0.3 mg/dL (ref 0.0–1.2)
CO2: 23 mmol/L (ref 20–29)
Calcium: 9.8 mg/dL (ref 8.7–10.2)
Chloride: 99 mmol/L (ref 96–106)
Creatinine, Ser: 0.77 mg/dL (ref 0.57–1.00)
Globulin, Total: 2.2 g/dL (ref 1.5–4.5)
Glucose: 82 mg/dL (ref 70–99)
Potassium: 4.6 mmol/L (ref 3.5–5.2)
Sodium: 136 mmol/L (ref 134–144)
Total Protein: 6.9 g/dL (ref 6.0–8.5)
eGFR: 111 mL/min/{1.73_m2} (ref >59)

## 2021-07-01 LAB — ANEMIA PROFILE B
Basophils Absolute: 0 10*3/uL (ref 0.0–0.2)
Basos: 1 %
EOS (ABSOLUTE): 0.2 10*3/uL (ref 0.0–0.4)
Eos: 3 %
Ferritin: 54 ng/mL (ref 15–150)
Folate: >20 ng/mL (ref >3.0)
Hematocrit: 41.9 % (ref 34.0–46.6)
Hemoglobin: 14 g/dL (ref 11.1–15.9)
Immature Grans (Abs): 0 10*3/uL (ref 0.0–0.1)
Immature Granulocytes: 0 %
Iron Saturation: 28 % (ref 15–55)
Iron: 98 ug/dL (ref 27–159)
Lymphocytes Absolute: 2.2 10*3/uL (ref 0.7–3.1)
Lymphs: 40 %
MCH: 30 pg (ref 26.6–33.0)
MCHC: 33.4 g/dL (ref 31.5–35.7)
MCV: 90 fL (ref 79–97)
Monocytes Absolute: 0.5 10*3/uL (ref 0.1–0.9)
Monocytes: 9 %
Neutrophils Absolute: 2.6 10*3/uL (ref 1.4–7.0)
Neutrophils: 47 %
Platelets: 337 10*3/uL (ref 150–450)
RBC: 4.66 x10E6/uL (ref 3.77–5.28)
RDW: 12.2 % (ref 11.7–15.4)
Retic Ct Pct: 1.7 % (ref 0.6–2.6)
Total Iron Binding Capacity: 348 ug/dL (ref 250–450)
UIBC: 250 ug/dL (ref 131–425)
Vitamin B-12: 320 pg/mL (ref 232–1245)
WBC: 5.6 10*3/uL (ref 3.4–10.8)

## 2021-07-01 LAB — LIPID PANEL
Chol/HDL Ratio: 5 ratio — ABNORMAL HIGH (ref 0.0–4.4)
Cholesterol, Total: 220 mg/dL — ABNORMAL HIGH (ref 100–199)
HDL: 44 mg/dL (ref >39)
LDL Chol Calc (NIH): 147 mg/dL — ABNORMAL HIGH (ref 0–99)
Triglycerides: 160 mg/dL — ABNORMAL HIGH (ref 0–149)
VLDL Cholesterol Cal: 29 mg/dL (ref 5–40)

## 2021-07-02 ENCOUNTER — Other Ambulatory Visit: Payer: Self-pay | Admitting: Family Medicine

## 2021-07-02 DIAGNOSIS — E039 Hypothyroidism, unspecified: Secondary | ICD-10-CM

## 2021-07-02 MED ORDER — LEVOTHYROXINE SODIUM 88 MCG PO TABS: 88.0000 ug | ORAL_TABLET | Freq: Every day | ORAL | 3 refills | Status: DC

## 2021-07-20 NOTE — L&D Delivery Note (Cosign Needed Addendum)
OB/GYN Faculty Practice Delivery Note  Marcia Gonzalez is a 26 y.o. G3P0020 s/p VD at [redacted]w[redacted]d. She was admitted for IOL 2/2 elevated doppler ratio.   ROM: 16h 42m with Clear fluid GBS Status: neg Maximum Maternal Temperature: 98.2  Labor Progress: Progressed from 9.5, 100,-1 to complete on pitocin.  Delivery Date/Time: 9/16/230@1055  Delivery: Called to room and patient was complete and pushing. Head delivered ROA. No nuchal cord present. Shoulder and body delivered in usual fashion doing hand on had delivery with FOB. Infant with no spontaneous cry, placed on mother's abdomen, dried and stimulated. Cord clamped x 2 after 1-minute delay, and cut by FOB. Infant transferred to neonatal resuscitation. Cord blood drawn. Placenta delivered spontaneously, intact, with 3-vessel cord. Fundus firm with massage and Pitocin. Labia, perineum, vagina, and cervix inspected, first degree stellate laceration found and repaired with 3-0 vicryl.   Placenta: Intact Complications: PPH due to laceration, TXA and pitocin given Lacerations: first degree stellate perineal laceration, right periurethral laceration  EBL: 182 Analgesia: Epidural  Infant: boy  APGARs 9  pending weight  Gifford Shave, MD  11:43 AM 04/04/2022

## 2021-07-20 NOTE — L&D Delivery Note (Deleted)
OB/GYN Faculty Practice Delivery Note  Marcia Gonzalez is a 26 y.o. G3P0020 s/p SVD at [redacted]w[redacted]d. She was admitted for IOL for cHTN and abnormal UA doplers (98.5%).   ROM: 16h 1m with clear fluid GBS Status:  Negative/-- (09/07 1424) Maximum Maternal Temperature:  Temp (24hrs), Avg:97.5 F (36.4 C), Min:96.4 F (35.8 C), Max:98.3 F (36.8 C)    Labor Progress: Patient arrived at 1 cm dilation and was induced with foley balloon, cytotec, pitocin, AROM .   Delivery Date/Time: 04/04/2022 at 1055 Delivery: Called to room and patient was complete and pushing. Head delivered in ROA position. No nuchal cord present. Hands on hand delivery with FOB. Shoulder and body delivered in usual fashion. Infant with spontaneous cry, placed on mother's abdomen, dried and stimulated. Cord clamped x 2 after 1-minute delay, and cut by FOB. Cord blood drawn. Placenta delivered spontaneously with gentle cord traction. Fundus firm with massage and Pitocin. Labia, perineum, vagina, and cervix inspected with 1st degree stellate perineal laceration, right periurethral laceration.   Placenta: spontaneous, intact, 3 vessel cord  Complications: none Lacerations: 1st degree perineal laceration  EBL: 182 Analgesia: epidural    Infant: APGAR (1 MIN):   APGAR (5 MINS):   APGAR (10 MINS):  9  Weight: pending   Gifford Shave, MD  04/04/2022 11:40 AM

## 2021-07-25 DIAGNOSIS — J069 Acute upper respiratory infection, unspecified: Secondary | ICD-10-CM | POA: Diagnosis not present

## 2021-07-25 DIAGNOSIS — J029 Acute pharyngitis, unspecified: Secondary | ICD-10-CM | POA: Diagnosis not present

## 2021-07-25 DIAGNOSIS — Z03818 Encounter for observation for suspected exposure to other biological agents ruled out: Secondary | ICD-10-CM | POA: Diagnosis not present

## 2021-07-29 ENCOUNTER — Ambulatory Visit: Payer: BC Managed Care – PPO | Admitting: Nurse Practitioner

## 2021-07-29 ENCOUNTER — Encounter: Payer: Self-pay | Admitting: Nurse Practitioner

## 2021-07-29 DIAGNOSIS — R110 Nausea: Secondary | ICD-10-CM

## 2021-07-29 DIAGNOSIS — R051 Acute cough: Secondary | ICD-10-CM

## 2021-07-29 MED ORDER — ONDANSETRON HCL 4 MG PO TABS: 4.0000 mg | ORAL_TABLET | Freq: Three times a day (TID) | ORAL | 0 refills | Status: DC | PRN

## 2021-07-29 MED ORDER — PREDNISONE 10 MG (21) PO TBPK: ORAL_TABLET | ORAL | 0 refills | Status: DC

## 2021-07-29 MED ORDER — GUAIFENESIN ER 600 MG PO TB12: 600.0000 mg | ORAL_TABLET | Freq: Two times a day (BID) | ORAL | 0 refills | Status: DC

## 2021-07-29 NOTE — Progress Notes (Signed)
° °  Virtual Visit  Note Due to COVID-19 pandemic this visit was conducted virtually. This visit type was conducted due to national recommendations for restrictions regarding the COVID-19 Pandemic (e.g. social distancing, sheltering in place) in an effort to limit this patient's exposure and mitigate transmission in our community. All issues noted in this document were discussed and addressed.  A physical exam was not performed with this format.  I connected with Marcia Gonzalez on 07/29/21 at 9:30 am  by telephone and verified that I am speaking with the correct person using two identifiers. Marcia Gonzalez is currently located at home during visit. The provider, Ivy Lynn, NP is located in their office at time of visit.  I discussed the limitations, risks, security and privacy concerns of performing an evaluation and management service by telephone and the availability of in person appointments. I also discussed with the patient that there may be a patient responsible charge related to this service. The patient expressed understanding and agreed to proceed.   History and Present Illness:   Cough This is a new problem. The current episode started in the past 7 days. The problem has been unchanged. The problem occurs constantly. The cough is Productive of sputum. Associated symptoms include headaches, nasal congestion, postnasal drip and a sore throat. Pertinent negatives include no chest pain or chills. Nothing aggravates the symptoms. Treatments tried: Antibiotics. The treatment provided mild relief.    Patient complains of nausea in the past 24 hours. No fever or chills/headache associated with symptoms. No changes to diet or sick contacts.    Observations/Objective: Tele visit patient not in distress  Assessment and Plan: Strep swab/COVID-19 swab negative per patient  Take meds as prescribed - Use a cool mist humidifier  -Use saline nose sprays frequently -Force fluids -For fever  or aches or pains- take Tylenol or ibuprofen. -Mucinex for cough -Prednisone taper congestion and sinus pressure. -Zofran 4 mg tablet by mouth as needed for nausea. -If symptoms do not improve, she may need to be COVID PCR tested to rule this out Follow up with worsening unresolved symptoms   Follow Up Instructions: Follow-up with unresolved or worsening symptoms    I discussed the assessment and treatment plan with the patient. The patient was provided an opportunity to ask questions and all were answered. The patient agreed with the plan and demonstrated an understanding of the instructions.   The patient was advised to call back or seek an in-person evaluation if the symptoms worsen or if the condition fails to improve as anticipated.  The above assessment and management plan was discussed with the patient. The patient verbalized understanding of and has agreed to the management plan. Patient is aware to call the clinic if symptoms persist or worsen. Patient is aware when to return to the clinic for a follow-up visit. Patient educated on when it is appropriate to go to the emergency department.   Time call ended: 9:41 AM  I provided 11 minutes of  non face-to-face time during this encounter.    Ivy Lynn, NP

## 2021-08-04 ENCOUNTER — Encounter: Payer: Self-pay | Admitting: Nurse Practitioner

## 2021-08-04 ENCOUNTER — Ambulatory Visit: Payer: BC Managed Care – PPO | Admitting: Nurse Practitioner

## 2021-08-04 DIAGNOSIS — R051 Acute cough: Secondary | ICD-10-CM | POA: Diagnosis not present

## 2021-08-04 DIAGNOSIS — J069 Acute upper respiratory infection, unspecified: Secondary | ICD-10-CM | POA: Insufficient documentation

## 2021-08-04 MED ORDER — GUAIFENESIN ER 600 MG PO TB12: 600.0000 mg | ORAL_TABLET | Freq: Two times a day (BID) | ORAL | 0 refills | Status: DC

## 2021-08-04 NOTE — Progress Notes (Signed)
° °  Virtual Visit  Note Due to COVID-19 pandemic this visit was conducted virtually. This visit type was conducted due to national recommendations for restrictions regarding the COVID-19 Pandemic (e.g. social distancing, sheltering in place) in an effort to limit this patient's exposure and mitigate transmission in our community. All issues noted in this document were discussed and addressed.  A physical exam was not performed with this format.  I connected with Marcia Gonzalez on 08/04/21 at 11:40 AM by telephone and verified that I am speaking with the correct person using two identifiers. Marcia Gonzalez is currently located at home during visit. The provider, Daryll Drown, NP is located in their office at time of visit.  I discussed the limitations, risks, security and privacy concerns of performing an evaluation and management service by telephone and the availability of in person appointments. I also discussed with the patient that there may be a patient responsible charge related to this service. The patient expressed understanding and agreed to proceed.   History and Present Illness:  Cough The current episode started in the past 7 days. The problem has been unchanged. The problem occurs constantly. The cough is Productive of sputum. Associated symptoms include nasal congestion. Pertinent negatives include no chest pain, chills, ear congestion, ear pain or headaches. She has tried oral steroids for the symptoms. The treatment provided moderate relief.     Review of Systems  Constitutional:  Negative for chills.  HENT:  Negative for ear pain.   Respiratory:  Positive for cough.   Cardiovascular:  Negative for chest pain.  Neurological:  Negative for headaches.    Observations/Objective: Televisit patient not in distress  Assessment and Plan: Patient currently on amoxicillin.  Advised patient to complete medication as prescribed - Use a cool mist humidifier  -Use saline nose  sprays frequently -Force fluids -For fever or aches or pains- take Tylenol or ibuprofen. -Guaifenesin for cough -If symptoms do not improve, she may need to be COVID tested to rule this out   Follow Up Instructions:     I discussed the assessment and treatment plan with the patient. The patient was provided an opportunity to ask questions and all were answered. The patient agreed with the plan and demonstrated an understanding of the instructions.   The patient was advised to call back or seek an in-person evaluation if the symptoms worsen or if the condition fails to improve as anticipated.  The above assessment and management plan was discussed with the patient. The patient verbalized understanding of and has agreed to the management plan. Patient is aware to call the clinic if symptoms persist or worsen. Patient is aware when to return to the clinic for a follow-up visit. Patient educated on when it is appropriate to go to the emergency department.   Time call ended: 11:48 AM.  IV fluid  I provided 8 minutes of  non face-to-face time during this encounter.    Daryll Drown, NP

## 2021-08-12 DIAGNOSIS — E038 Other specified hypothyroidism: Secondary | ICD-10-CM | POA: Diagnosis not present

## 2021-08-12 DIAGNOSIS — E063 Autoimmune thyroiditis: Secondary | ICD-10-CM | POA: Diagnosis not present

## 2021-08-13 ENCOUNTER — Encounter: Payer: Self-pay | Admitting: Family Medicine

## 2021-08-13 ENCOUNTER — Ambulatory Visit: Payer: BC Managed Care – PPO | Admitting: Family Medicine

## 2021-08-13 VITALS — BP 127/85 | HR 76 | Temp 98.6°F | Ht 65.0 in | Wt 240.0 lb

## 2021-08-13 DIAGNOSIS — K648 Other hemorrhoids: Secondary | ICD-10-CM | POA: Diagnosis not present

## 2021-08-13 DIAGNOSIS — F331 Major depressive disorder, recurrent, moderate: Secondary | ICD-10-CM | POA: Diagnosis not present

## 2021-08-13 DIAGNOSIS — F411 Generalized anxiety disorder: Secondary | ICD-10-CM

## 2021-08-13 MED ORDER — ESCITALOPRAM OXALATE 20 MG PO TABS: 20.0000 mg | ORAL_TABLET | Freq: Every day | ORAL | 1 refills | Status: DC

## 2021-08-13 MED ORDER — HYDROCORTISONE ACETATE 25 MG RE SUPP: 25.0000 mg | Freq: Two times a day (BID) | RECTAL | 0 refills | Status: AC

## 2021-08-13 NOTE — Progress Notes (Signed)
Subjective:  Patient ID: Marcia Gonzalez, female    DOB: 03-21-1996, 26 y.o.   MRN: [id number]  Patient Care Team: Gwenlyn Perking, FNP as PCP - General (Family Medicine)   Chief Complaint:  Rectal Bleeding (X 1 day, profuse,  heartburn)   HPI: Marcia Gonzalez is a 26 y.o. female presenting on 08/13/2021 for Rectal Bleeding (X 1 day, profuse,  heartburn)   Pt presents today for anxiety and depression follow up. Her Lexapro was increased to 20 mg by PCP 6 weeks ago. Pt states she has tolerated this dose adjustment well and feels her depression and anxiety has improved. No SI or HI.  She reports last night she went to the restroom and had bright red rectal bleeding. States this only occurred once and has since resolved. No other associated symptoms.   Rectal Bleeding  The current episode started yesterday. The onset was sudden. Episode frequency: Once. The problem has been resolved. The patient is experiencing no pain. The stool is described as soft. Pertinent negatives include no anorexia, no fever, no abdominal pain, no diarrhea, no hematemesis, no hemorrhoids, no nausea, no rectal pain, no vomiting, no hematuria, no vaginal bleeding, no vaginal discharge, no chest pain, no headaches, no coughing, no difficulty breathing and no rash.   GAD 7 : Generalized Anxiety Score 08/13/2021 06/30/2021  Nervous, Anxious, on Edge 1 1  Control/stop worrying 0 1  Worry too much - different things 0 1  Trouble relaxing 1 1  Restless 0 1  Easily annoyed or irritable 1 1  Afraid - awful might happen 1 1  Total GAD 7 Score 4 7  Anxiety Difficulty Somewhat difficult Not difficult at all    Depression screen [facility] 2/9 08/13/2021 06/30/2021  Decreased Interest 1 1  Down, Depressed, Hopeless 1 2  PHQ - 2 Score 2 3  Altered sleeping 1 2  Tired, decreased energy 1 2  Change in appetite 1 1  Feeling bad or failure about yourself  1 1  Trouble concentrating 1 0  Moving slowly or fidgety/restless 1 1   Suicidal thoughts 0 0  PHQ-9 Score 8 10  Difficult doing work/chores Somewhat difficult Very difficult     Relevant past medical, surgical, family, and social history reviewed and updated as indicated.  Allergies and medications reviewed and updated. Data reviewed: Chart in Epic.   Past Medical History:  Diagnosis Date   Allergy    Anemia    Anxiety    Arthritis    Depression    Endometriosis    GERD (gastroesophageal reflux disease)    Hypothyroidism    IIH (idiopathic intracranial hypertension)    Wells' syndrome     Past Surgical History:  Procedure Laterality Date   ABDOMINAL EXPLORATION SURGERY  04/2021   DILATION AND CURETTAGE OF UTERUS     FINGER SURGERY      Social History   Socioeconomic History   Marital status: Married    Spouse name: Darlyn Chamber   Number of children: 0   Years of education: 12   Highest education level: High school graduate  Occupational History   Not on file  Tobacco Use   Smoking status: Never    Passive exposure: Never   Smokeless tobacco: Never  Vaping Use   Vaping Use: Never used  Substance and Sexual Activity   Alcohol use: Not Currently   Drug use: Never   Sexual activity: Yes    Birth control/protection: None  Other Topics  Not on file  °Social History Narrative  ° Not on file  ° °Social Determinants of Health  ° °Financial Resource Strain: Not on file  °Food Insecurity: Not on file  °Transportation Needs: Not on file  °Physical Activity: Not on file  °Stress: Not on file  °Social Connections: Not on file  °Intimate Partner Violence: Not on file  ° ° °Outpatient Encounter Medications as of 08/13/2021  °Medication Sig  ° buPROPion (WELLBUTRIN XL) 150 MG 24 hr tablet Take 150 mg by mouth daily.  ° ferrous sulfate 325 (65 FE) MG tablet Take 325 mg by mouth daily with breakfast.  ° folic acid (FOLVITE) 1 MG tablet Take 1 mg by mouth daily.  ° guaiFENesin (MUCINEX) 600 MG 12 hr tablet Take 1 tablet (600 mg total) by mouth 2  (two) times daily.  ° hydrocortisone (ANUSOL-HC) 25 MG suppository Place 1 suppository (25 mg total) rectally 2 (two) times daily for 5 days.  ° levothyroxine (SYNTHROID) 88 MCG tablet Take 1 tablet (88 mcg total) by mouth daily before breakfast.  ° liothyronine (CYTOMEL) 5 MCG tablet Take 5 mcg by mouth daily.  ° ondansetron (ZOFRAN) 4 MG tablet Take 1 tablet (4 mg total) by mouth every 8 (eight) hours as needed for nausea or vomiting.  ° Prenatal Vit-Fe Fumarate-FA (PRENATAL MULTIVITAMIN) TABS tablet Take 1 tablet by mouth daily at 12 noon.  ° pyridOXINE (VITAMIN B-6) 100 MG tablet Take 100 mg by mouth daily.  ° [DISCONTINUED] escitalopram (LEXAPRO) 20 MG tablet Take 1 tablet (20 mg total) by mouth daily.  ° [DISCONTINUED] predniSONE (STERAPRED UNI-PAK 21 TAB) 10 MG (21) TBPK tablet 6 tablet day 1, 5 tablet day 2, 4 tablet day 3, 3 tablet day 4, 2 tablet day 5, 1 tablet day 6  ° escitalopram (LEXAPRO) 20 MG tablet Take 1 tablet (20 mg total) by mouth daily.  ° °No facility-administered encounter medications on file as of 08/13/2021.  ° ° °Allergies  °Allergen Reactions  ° Codeine Nausea Only  ° Augmentin [Amoxicillin-Pot Clavulanate] Other (See Comments)  °  Hallucinations  ° ° °Review of Systems  °Constitutional:  Negative for activity change, appetite change, chills, fatigue and fever.  °HENT: Negative.    °Eyes: Negative.   °Respiratory:  Negative for cough, chest tightness and shortness of breath.   °Cardiovascular:  Negative for chest pain, palpitations and leg swelling.  °Gastrointestinal:  Positive for anal bleeding and hematochezia. Negative for abdominal distention, abdominal pain, anorexia, blood in stool, constipation, diarrhea, hematemesis, hemorrhoids, nausea, rectal pain and vomiting.  °Endocrine: Negative.   °Genitourinary:  Negative for dysuria, frequency, hematuria, urgency, vaginal bleeding and vaginal discharge.  °Musculoskeletal:  Negative for arthralgias and myalgias.  °Skin: Negative.   Negative for rash.  °Allergic/Immunologic: Positive for immunocompromised state.  °Neurological:  Negative for dizziness and headaches.  °Hematological: Negative.   °Psychiatric/Behavioral:  Negative for confusion, hallucinations, sleep disturbance and suicidal ideas.   °All other systems reviewed and are negative. ° °   ° °Objective:  °BP 127/85    Pulse 76    Temp 98.6 °F (37 °C)    Ht 5' 5" (1.651 m)    Wt 240 lb (108.9 kg)    LMP 07/22/2021 (Exact Date)    SpO2 97%    Breastfeeding Unknown    BMI 39.94 kg/m²   ° °Wt Readings from Last 3 Encounters:  °08/13/21 240 lb (108.9 kg)  °06/30/21 237 lb 6 oz (107.7 kg)  °12/28/20 235 lb (106.6 kg)  ° ° °  lb (106.6 kg)    Physical Exam Vitals and nursing note reviewed. Exam conducted with a chaperone present.  Constitutional:      General: She is not in acute distress.    Appearance: Normal appearance. She is well-developed and well-groomed. She is obese. She is not ill-appearing, toxic-appearing or diaphoretic.  HENT:     Head: Normocephalic and atraumatic.     Jaw: There is normal jaw occlusion.     Right Ear: Hearing normal.     Left Ear: Hearing normal.     Nose: Nose normal.     Mouth/Throat:     Lips: Pink.     Mouth: Mucous membranes are moist.     Pharynx: Oropharynx is clear. Uvula midline.  Eyes:     General: Lids are normal.     Extraocular Movements: Extraocular movements intact.     Conjunctiva/sclera: Conjunctivae normal.     Pupils: Pupils are equal, round, and reactive to light.  Neck:     Thyroid: No thyroid mass, thyromegaly or thyroid tenderness.     Vascular: No carotid bruit or JVD.     Trachea: Trachea and phonation normal.  Cardiovascular:     Rate and Rhythm: Normal rate and regular rhythm.     Chest Wall: PMI is not displaced.     Pulses: Normal pulses.     Heart sounds: Normal heart sounds. No murmur heard.   No friction rub. No gallop.  Pulmonary:     Effort: Pulmonary effort is normal. No respiratory distress.     Breath sounds:  Normal breath sounds. No wheezing.  Abdominal:     General: Bowel sounds are normal. There is no distension or abdominal bruit.     Palpations: Abdomen is soft. There is no hepatomegaly or splenomegaly.     Tenderness: There is no abdominal tenderness. There is no right CVA tenderness or left CVA tenderness.     Hernia: No hernia is present.  Genitourinary:    Exam position: Knee-chest position.     Rectum: Guaiac result negative. Internal hemorrhoid (at 6 o'clock) present. No mass, tenderness, anal fissure or external hemorrhoid. Normal anal tone.  Musculoskeletal:        General: Normal range of motion.     Cervical back: Normal range of motion and neck supple.     Right lower leg: No edema.     Left lower leg: No edema.  Lymphadenopathy:     Cervical: No cervical adenopathy.  Skin:    General: Skin is warm and dry.     Capillary Refill: Capillary refill takes less than 2 seconds.     Coloration: Skin is not cyanotic, jaundiced or pale.     Findings: No rash.  Neurological:     General: No focal deficit present.     Mental Status: She is alert and oriented to person, place, and time.     Sensory: Sensation is intact.     Motor: Motor function is intact.     Coordination: Coordination is intact.     Gait: Gait is intact.     Deep Tendon Reflexes: Reflexes are normal and symmetric.  Psychiatric:        Attention and Perception: Attention and perception normal.        Mood and Affect: Mood and affect normal.        Speech: Speech normal.        Behavior: Behavior normal. Behavior is cooperative.  Cognition and Memory: Cognition and memory normal.     °   Judgment: Judgment normal.  ° ° °Results for orders placed or performed in visit on 06/30/21  °Anemia Profile B  °Result Value Ref Range  ° Total Iron Binding Capacity 348 250 - 450 ug/dL  ° UIBC 250 131 - 425 ug/dL  ° Iron 98 27 - 159 ug/dL  ° Iron Saturation 28 15 - 55 %  °  Ferritin 54 15 - 150 ng/mL  ° Vitamin B-12 320 232 - 1,245 pg/mL  ° Folate >20.0 >3.0 ng/mL  ° WBC 5.6 3.4 - 10.8 x10E3/uL  ° RBC 4.66 3.77 - 5.28 x10E6/uL  ° Hemoglobin 14.0 11.1 - 15.9 g/dL  ° Hematocrit 41.9 34.0 - 46.6 %  ° MCV 90 79 - 97 fL  ° MCH 30.0 26.6 - 33.0 pg  ° MCHC 33.4 31.5 - 35.7 g/dL  ° RDW 12.2 11.7 - 15.4 %  ° Platelets 337 150 - 450 x10E3/uL  ° Neutrophils 47 Not Estab. %  ° Lymphs 40 Not Estab. %  ° Monocytes 9 Not Estab. %  ° Eos 3 Not Estab. %  ° Basos 1 Not Estab. %  ° Neutrophils Absolute 2.6 1.4 - 7.0 x10E3/uL  ° Lymphocytes Absolute 2.2 0.7 - 3.1 x10E3/uL  ° Monocytes Absolute 0.5 0.1 - 0.9 x10E3/uL  ° EOS (ABSOLUTE) 0.2 0.0 - 0.4 x10E3/uL  ° Basophils Absolute 0.0 0.0 - 0.2 x10E3/uL  ° Immature Granulocytes 0 Not Estab. %  ° Immature Grans (Abs) 0.0 0.0 - 0.1 x10E3/uL  ° Retic Ct Pct 1.7 0.6 - 2.6 %  °CMP14+EGFR  °Result Value Ref Range  ° Glucose 82 70 - 99 mg/dL  ° BUN 10 6 - 20 mg/dL  ° Creatinine, Ser 0.77 0.57 - 1.00 mg/dL  ° eGFR 111 >59 mL/min/1.73  ° BUN/Creatinine Ratio 13 9 - 23  ° Sodium 136 134 - 144 mmol/L  ° Potassium 4.6 3.5 - 5.2 mmol/L  ° Chloride 99 96 - 106 mmol/L  ° CO2 23 20 - 29 mmol/L  ° Calcium 9.8 8.7 - 10.2 mg/dL  ° Total Protein 6.9 6.0 - 8.5 g/dL  ° Albumin 4.7 3.9 - 5.0 g/dL  ° Globulin, Total 2.2 1.5 - 4.5 g/dL  ° Albumin/Globulin Ratio 2.1 1.2 - 2.2  ° Bilirubin Total 0.3 0.0 - 1.2 mg/dL  ° Alkaline Phosphatase 60 44 - 121 IU/L  ° AST 18 0 - 40 IU/L  ° ALT 17 0 - 32 IU/L  °TSH  °Result Value Ref Range  ° TSH 0.304 (L) 0.450 - 4.500 uIU/mL  °Lipid panel  °Result Value Ref Range  ° Cholesterol, Total 220 (H) 100 - 199 mg/dL  ° Triglycerides 160 (H) 0 - 149 mg/dL  ° HDL 44 >39 mg/dL  ° VLDL Cholesterol Cal 29 5 - 40 mg/dL  ° LDL Chol Calc (NIH) 147 (H) 0 - 99 mg/dL  ° Chol/HDL Ratio 5.0 (H) 0.0 - 4.4 ratio  ° °   ° °Pertinent labs & imaging results that were available during my care of the patient were reviewed by me and considered in my medical decision  making. ° °Assessment & Plan:  °Marcia Gonzalez was seen today for rectal bleeding. ° °Diagnoses and all orders for this visit: ° °Internal hemorrhoid °Symptomatic care discussed in detail. Anusol for 5 days as prescribed. Avoid straining and heavy lifting. Report any new, recurrent, or worsening symptoms. If symptoms continue will refer to GI.  °-       recurrent, or worsening symptoms. If symptoms continue will refer to GI.  -     hydrocortisone (ANUSOL-HC) 25 MG suppository; Place 1 suppository (25 mg total) rectally 2 (two) times daily for 5 days.  Depression, major, recurrent, moderate (HCC) Generalized anxiety disorder Has tolerated dose increase well and feels symptoms are better controlled. Will continue.  -     escitalopram (LEXAPRO) 20 MG tablet; Take 1 tablet (20 mg total) by mouth daily.     Continue all other maintenance medications.  Follow up plan: Return in 3 months (on 11/11/2021), or if symptoms worsen or fail to improve, for PCP depression.   Continue healthy lifestyle choices, including diet (rich in fruits, vegetables, and lean proteins, and low in salt and simple carbohydrates) and exercise (at least 30 minutes of moderate physical activity daily).  Educational handout given for hemorrhoids  The above assessment and management plan was discussed with the patient. The patient verbalized understanding of and has agreed to the management plan. Patient is aware to call the clinic if they develop any new symptoms or if symptoms persist or worsen. Patient is aware when to return to the clinic for a follow-up visit. Patient educated on when it is appropriate to go to the emergency department.   Monia Pouch, FNP-C [facility] [phone]

## 2021-08-21 ENCOUNTER — Telehealth: Payer: Self-pay | Admitting: Family Medicine

## 2021-08-21 NOTE — Telephone Encounter (Signed)
Pt scheduled with Marcelino Duster tomorrow for referral to [facility] and also c/o nausea. Pt states she was able to get in touch with her old OB and they sent in a month supply of progesterone for her to get her through till she can get in with OB here.

## 2021-08-22 ENCOUNTER — Other Ambulatory Visit: Payer: Self-pay | Admitting: Family Medicine

## 2021-08-22 ENCOUNTER — Encounter: Payer: Self-pay | Admitting: Family Medicine

## 2021-08-22 ENCOUNTER — Ambulatory Visit: Payer: BC Managed Care – PPO | Admitting: Family Medicine

## 2021-08-22 VITALS — BP 117/67 | HR 93 | Temp 98.3°F | Ht 65.0 in | Wt 241.4 lb

## 2021-08-22 DIAGNOSIS — Z3201 Encounter for pregnancy test, result positive: Secondary | ICD-10-CM | POA: Diagnosis not present

## 2021-08-22 DIAGNOSIS — O219 Vomiting of pregnancy, unspecified: Secondary | ICD-10-CM

## 2021-08-22 DIAGNOSIS — R7989 Other specified abnormal findings of blood chemistry: Secondary | ICD-10-CM | POA: Diagnosis not present

## 2021-08-22 DIAGNOSIS — R110 Nausea: Secondary | ICD-10-CM

## 2021-08-22 DIAGNOSIS — Z3A01 Less than 8 weeks gestation of pregnancy: Secondary | ICD-10-CM

## 2021-08-22 DIAGNOSIS — O09291 Supervision of pregnancy with other poor reproductive or obstetric history, first trimester: Secondary | ICD-10-CM

## 2021-08-22 NOTE — Progress Notes (Signed)
Subjective:  Patient ID: Marcia Gonzalez, female    DOB: 1996/04/21, 26 y.o.   MRN: [id number]  Patient Care Team: Gwenlyn Perking, FNP as PCP - General (Family Medicine)   Chief Complaint:  Nausea and positive urine pregnancy test   HPI: Marcia Gonzalez is a 26 y.o. female presenting on 08/22/2021 for Nausea and positive urine pregnancy test   Patient presents today for referral to OB as she recently tested positive for pregnancy. She took 2 home pregnancy tests which were positive. She has had 2 failed pregnancies in the past, 4 D&C procedures.  She is on progesterone oral repletion therapy. LMP 07/20/2021, EDD 41w5dby LMP. She states she has nausea, fatigue, and breast tenderness.     Relevant past medical, surgical, family, and social history reviewed and updated as indicated.  Allergies and medications reviewed and updated. Data reviewed: Chart in Epic.   Past Medical History:  Diagnosis Date   Allergy    Anemia    Anxiety    Arthritis    Depression    Endometriosis    GERD (gastroesophageal reflux disease)    Hypothyroidism    IIH (idiopathic intracranial hypertension)    Wells' syndrome     Past Surgical History:  Procedure Laterality Date   ABDOMINAL EXPLORATION SURGERY  04/2021   DILATION AND CURETTAGE OF UTERUS     FINGER SURGERY      Social History   Socioeconomic History   Marital status: Married    Spouse name: JDarlyn Chamber  Number of children: 0   Years of education: 12   Highest education level: High school graduate  Occupational History   Not on file  Tobacco Use   Smoking status: Never    Passive exposure: Never   Smokeless tobacco: Never  Vaping Use   Vaping Use: Never used  Substance and Sexual Activity   Alcohol use: Not Currently   Drug use: Never   Sexual activity: Yes    Birth control/protection: None  Other Topics Concern   Not on file  Social History Narrative   Not on file   Social Determinants of Health   Financial  Resource Strain: Not on file  Food Insecurity: Not on file  Transportation Needs: Not on file  Physical Activity: Not on file  Stress: Not on file  Social Connections: Not on file  Intimate Partner Violence: Not on file    Outpatient Encounter Medications as of 08/22/2021  Medication Sig   buPROPion (WELLBUTRIN XL) 150 MG 24 hr tablet Take 150 mg by mouth daily.   escitalopram (LEXAPRO) 20 MG tablet Take 1 tablet (20 mg total) by mouth daily.   ferrous sulfate 325 (65 FE) MG tablet Take 325 mg by mouth daily with breakfast.   folic acid (FOLVITE) 1 MG tablet Take 1 mg by mouth daily.   levothyroxine (SYNTHROID) 125 MCG tablet Take 125 mcg by mouth daily before breakfast.   ondansetron (ZOFRAN) 4 MG tablet Take 1 tablet (4 mg total) by mouth every 8 (eight) hours as needed for nausea or vomiting.   Prenatal Vit-Fe Fumarate-FA (PRENATAL MULTIVITAMIN) TABS tablet Take 1 tablet by mouth daily at 12 noon.   progesterone (PROMETRIUM) 200 MG capsule Take 400 mg by mouth daily.   pyridOXINE (VITAMIN B-6) 100 MG tablet Take 100 mg by mouth daily.   [DISCONTINUED] guaiFENesin (MUCINEX) 600 MG 12 hr tablet Take 1 tablet (600 mg total) by mouth 2 (two) times daily.   [DISCONTINUED] levothyroxine (  SYNTHROID) 88 MCG tablet Take 1 tablet (88 mcg total) by mouth daily before breakfast.   [DISCONTINUED] liothyronine (CYTOMEL) 5 MCG tablet Take 5 mcg by mouth daily.   No facility-administered encounter medications on file as of 08/22/2021.    Allergies  Allergen Reactions   Codeine Nausea Only   Augmentin [Amoxicillin-Pot Clavulanate] Other (See Comments)    Hallucinations    Review of Systems  Constitutional:  Positive for activity change, appetite change and fatigue. Negative for chills, diaphoresis, fever and unexpected weight change.  Respiratory:  Negative for cough and shortness of breath.   Cardiovascular:  Negative for chest pain, palpitations and leg swelling.  Gastrointestinal:  Positive  for nausea. Negative for abdominal pain and vomiting.  Genitourinary:  Positive for menstrual problem. Negative for decreased urine volume, difficulty urinating, dyspareunia, dysuria, enuresis, flank pain, frequency, genital sores, hematuria, pelvic pain, urgency, vaginal bleeding, vaginal discharge and vaginal pain.  Neurological:  Negative for dizziness, weakness and headaches.  Psychiatric/Behavioral:  Negative for confusion.   All other systems reviewed and are negative.      Objective:  BP 117/67    Pulse 93    Temp 98.3 F (36.8 C) (Temporal)    Ht '5\' 5"'  (0.017 m)    Wt 241 lb 6 oz (109.5 kg)    LMP 11/13/2020    BMI 40.17 kg/m    Wt Readings from Last 3 Encounters:  08/22/21 241 lb 6 oz (109.5 kg)  08/13/21 240 lb (108.9 kg)  06/30/21 237 lb 6 oz (107.7 kg)    Physical Exam Vitals and nursing note reviewed.  Constitutional:      General: She is not in acute distress.    Appearance: Normal appearance. She is obese. She is not ill-appearing, toxic-appearing or diaphoretic.  HENT:     Head: Normocephalic and atraumatic.     Mouth/Throat:     Mouth: Mucous membranes are moist.  Eyes:     Conjunctiva/sclera: Conjunctivae normal.     Pupils: Pupils are equal, round, and reactive to light.  Cardiovascular:     Rate and Rhythm: Normal rate and regular rhythm.     Heart sounds: Normal heart sounds. No murmur heard.   No gallop.  Pulmonary:     Effort: Pulmonary effort is normal.     Breath sounds: Normal breath sounds.  Abdominal:     General: Bowel sounds are normal.     Palpations: Abdomen is soft.     Tenderness: There is no abdominal tenderness.  Musculoskeletal:     Right lower leg: No edema.     Left lower leg: No edema.  Skin:    General: Skin is warm and dry.     Capillary Refill: Capillary refill takes less than 2 seconds.  Neurological:     General: No focal deficit present.     Mental Status: She is alert and oriented to person, place, and time.   Psychiatric:        Mood and Affect: Mood normal.        Behavior: Behavior normal.        Thought Content: Thought content normal.        Judgment: Judgment normal.    Results for orders placed or performed in visit on 06/30/21  Anemia Profile B  Result Value Ref Range   Total Iron Binding Capacity 348 250 - 450 ug/dL   UIBC 250 131 - 425 ug/dL   Iron 98 27 - 159 ug/dL   Iron  Saturation 28 15 - 55 %   Ferritin 54 15 - 150 ng/mL   Vitamin B-12 320 232 - 1,245 pg/mL   Folate >20.0 >3.0 ng/mL   WBC 5.6 3.4 - 10.8 x10E3/uL   RBC 4.66 3.77 - 5.28 x10E6/uL   Hemoglobin 14.0 11.1 - 15.9 g/dL   Hematocrit 41.9 34.0 - 46.6 %   MCV 90 79 - 97 fL   MCH 30.0 26.6 - 33.0 pg   MCHC 33.4 31.5 - 35.7 g/dL   RDW 12.2 11.7 - 15.4 %   Platelets 337 150 - 450 x10E3/uL   Neutrophils 47 Not Estab. %   Lymphs 40 Not Estab. %   Monocytes 9 Not Estab. %   Eos 3 Not Estab. %   Basos 1 Not Estab. %   Neutrophils Absolute 2.6 1.4 - 7.0 x10E3/uL   Lymphocytes Absolute 2.2 0.7 - 3.1 x10E3/uL   Monocytes Absolute 0.5 0.1 - 0.9 x10E3/uL   EOS (ABSOLUTE) 0.2 0.0 - 0.4 x10E3/uL   Basophils Absolute 0.0 0.0 - 0.2 x10E3/uL   Immature Granulocytes 0 Not Estab. %   Immature Grans (Abs) 0.0 0.0 - 0.1 x10E3/uL   Retic Ct Pct 1.7 0.6 - 2.6 %  CMP14+EGFR  Result Value Ref Range   Glucose 82 70 - 99 mg/dL   BUN 10 6 - 20 mg/dL   Creatinine, Ser 0.77 0.57 - 1.00 mg/dL   eGFR 111 >59 mL/min/1.73   BUN/Creatinine Ratio 13 9 - 23   Sodium 136 134 - 144 mmol/L   Potassium 4.6 3.5 - 5.2 mmol/L   Chloride 99 96 - 106 mmol/L   CO2 23 20 - 29 mmol/L   Calcium 9.8 8.7 - 10.2 mg/dL   Total Protein 6.9 6.0 - 8.5 g/dL   Albumin 4.7 3.9 - 5.0 g/dL   Globulin, Total 2.2 1.5 - 4.5 g/dL   Albumin/Globulin Ratio 2.1 1.2 - 2.2   Bilirubin Total 0.3 0.0 - 1.2 mg/dL   Alkaline Phosphatase 60 44 - 121 IU/L   AST 18 0 - 40 IU/L   ALT 17 0 - 32 IU/L  TSH  Result Value Ref Range   TSH 0.304 (L) 0.450 - 4.500 uIU/mL   Lipid panel  Result Value Ref Range   Cholesterol, Total 220 (H) 100 - 199 mg/dL   Triglycerides 160 (H) 0 - 149 mg/dL   HDL 44 >39 mg/dL   VLDL Cholesterol Cal 29 5 - 40 mg/dL   LDL Chol Calc (NIH) 147 (H) 0 - 99 mg/dL   Chol/HDL Ratio 5.0 (H) 0.0 - 4.4 ratio       Pertinent labs & imaging results that were available during my care of the patient were reviewed by me and considered in my medical decision making.  Assessment & Plan:  Arnecia was seen today for nausea and positive urine pregnancy test.  Diagnoses and all orders for this visit:  Less than [redacted] weeks gestation of pregnancy History of miscarriage, currently pregnant, first trimester Low serum progesterone Urgent referral to OB as pt is high risk due to prior history of miscarriages. Will obtain baseline labs along with progesterone level and hCG quant.  -     Ambulatory referral to Obstetrics / Gynecology -     CBC with Differential/Platelet -     BMP8+EGFR -     Human Chorionic Gonadotropin (hCG),Quantitative (Serial Monitor) -     Progesterone  Nausea and vomiting in pregnancy Pt has zofran at home which was  recently prescribed. Symptomatic care discussed in detail.     Continue all other maintenance medications.  Follow up plan: Return if symptoms worsen or fail to improve.   Continue healthy lifestyle choices, including diet (rich in fruits, vegetables, and lean proteins, and low in salt and simple carbohydrates) and exercise (at least 30 minutes of moderate physical activity daily).  Educational handout given for first trimester pregnancy  The above assessment and management plan was discussed with the patient. The patient verbalized understanding of and has agreed to the management plan. Patient is aware to call the clinic if they develop any new symptoms or if symptoms persist or worsen. Patient is aware when to return to the clinic for a follow-up visit. Patient educated on when it is appropriate to go to  the emergency department.   Monia Pouch, FNP-C [facility] [phone]

## 2021-08-23 LAB — CBC WITH DIFFERENTIAL/PLATELET
Basophils Absolute: 0 10*3/uL (ref 0.0–0.2)
Basos: 1 %
EOS (ABSOLUTE): 0.1 10*3/uL (ref 0.0–0.4)
Eos: 2 %
Hematocrit: 36 % (ref 34.0–46.6)
Hemoglobin: 12.3 g/dL (ref 11.1–15.9)
Immature Grans (Abs): 0 10*3/uL (ref 0.0–0.1)
Immature Granulocytes: 0 %
Lymphocytes Absolute: 1.7 10*3/uL (ref 0.7–3.1)
Lymphs: 32 %
MCH: 30.4 pg (ref 26.6–33.0)
MCHC: 34.2 g/dL (ref 31.5–35.7)
MCV: 89 fL (ref 79–97)
Monocytes Absolute: 0.5 10*3/uL (ref 0.1–0.9)
Monocytes: 9 %
Neutrophils Absolute: 3 10*3/uL (ref 1.4–7.0)
Neutrophils: 56 %
Platelets: 348 10*3/uL (ref 150–450)
RBC: 4.05 x10E6/uL (ref 3.77–5.28)
RDW: 11.8 % (ref 11.7–15.4)
WBC: 5.3 10*3/uL (ref 3.4–10.8)

## 2021-08-23 LAB — PROGESTERONE: Progesterone: 33.7 ng/mL

## 2021-08-23 LAB — BMP8+EGFR
BUN/Creatinine Ratio: 7 — ABNORMAL LOW (ref 9–23)
BUN: 5 mg/dL — ABNORMAL LOW (ref 6–20)
CO2: 21 mmol/L (ref 20–29)
Calcium: 9.1 mg/dL (ref 8.7–10.2)
Chloride: 102 mmol/L (ref 96–106)
Creatinine, Ser: 0.69 mg/dL (ref 0.57–1.00)
Glucose: 94 mg/dL (ref 70–99)
Potassium: 4.1 mmol/L (ref 3.5–5.2)
Sodium: 136 mmol/L (ref 134–144)
eGFR: 125 mL/min/{1.73_m2} (ref >59)

## 2021-08-23 LAB — HUMAN CHORIONIC GONADOTROPIN(HCG),B-SUBUNIT,QUANTITATIVE): HCG, Beta Chain, Quant, S: 3768 m[IU]/mL

## 2021-08-25 ENCOUNTER — Telehealth: Payer: Self-pay | Admitting: Family Medicine

## 2021-08-29 ENCOUNTER — Ambulatory Visit (INDEPENDENT_AMBULATORY_CARE_PROVIDER_SITE_OTHER): Payer: BC Managed Care – PPO | Admitting: Family Medicine

## 2021-08-29 ENCOUNTER — Encounter: Payer: Self-pay | Admitting: Family Medicine

## 2021-08-29 DIAGNOSIS — O219 Vomiting of pregnancy, unspecified: Secondary | ICD-10-CM | POA: Diagnosis not present

## 2021-08-29 DIAGNOSIS — K219 Gastro-esophageal reflux disease without esophagitis: Secondary | ICD-10-CM | POA: Diagnosis not present

## 2021-08-29 MED ORDER — ONDANSETRON 4 MG PO TBDP: 4.0000 mg | ORAL_TABLET | Freq: Three times a day (TID) | ORAL | 2 refills | Status: DC | PRN

## 2021-08-29 MED ORDER — FAMOTIDINE 20 MG PO TABS: 20.0000 mg | ORAL_TABLET | Freq: Two times a day (BID) | ORAL | 2 refills | Status: DC

## 2021-08-29 NOTE — Progress Notes (Signed)
Virtual Visit via telephone Note  I connected with Marcia Gonzalez on 08/29/21 at 1552 by telephone and verified that I am speaking with the correct person using two identifiers. Marcia Gonzalez is currently located at home and patient are currently with her during visit. The provider, Fransisca Kaufmann Tytianna Greenley, MD is located in their office at time of visit.  Call ended at 1601  I discussed the limitations, risks, security and privacy concerns of performing an evaluation and management service by telephone and the availability of in person appointments. I also discussed with the patient that there may be a patient responsible charge related to this service. The patient expressed understanding and agreed to proceed.   History and Present Illness: Patient is pregnant about 1 week ago found out.  Her last period was Jan 1st-6th which makes her about [redacted] weeks pregnant.  This is 3rd pregnancy and she has 2 miscarriages.  She saw OB before but just moved here. She had nausea with other pregnancies but not this bad.  She has heartburn.  She is using tums. She has some zofran.   1. Nausea and vomiting in pregnancy   2. Gastroesophageal reflux disease without esophagitis     Outpatient Encounter Medications as of 08/29/2021  Medication Sig   famotidine (PEPCID) 20 MG tablet Take 1 tablet (20 mg total) by mouth 2 (two) times daily.   ondansetron (ZOFRAN-ODT) 4 MG disintegrating tablet Take 1 tablet (4 mg total) by mouth every 8 (eight) hours as needed for nausea or vomiting.   buPROPion (WELLBUTRIN XL) 150 MG 24 hr tablet Take 150 mg by mouth daily.   escitalopram (LEXAPRO) 20 MG tablet Take 1 tablet (20 mg total) by mouth daily.   ferrous sulfate 325 (65 FE) MG tablet Take 325 mg by mouth daily with breakfast.   folic acid (FOLVITE) 1 MG tablet Take 1 mg by mouth daily.   levothyroxine (SYNTHROID) 125 MCG tablet Take 125 mcg by mouth daily before breakfast.   Prenatal Vit-Fe Fumarate-FA (PRENATAL  MULTIVITAMIN) TABS tablet Take 1 tablet by mouth daily at 12 noon.   progesterone (PROMETRIUM) 200 MG capsule Take 400 mg by mouth daily.   pyridOXINE (VITAMIN B-6) 100 MG tablet Take 100 mg by mouth daily.   [DISCONTINUED] ondansetron (ZOFRAN) 4 MG tablet Take 1 tablet (4 mg total) by mouth every 8 (eight) hours as needed for nausea or vomiting.   No facility-administered encounter medications on file as of 08/29/2021.    Review of Systems  Constitutional:  Negative for chills and fever.  Eyes:  Negative for visual disturbance.  Respiratory:  Negative for chest tightness and shortness of breath.   Cardiovascular:  Negative for chest pain and leg swelling.  Musculoskeletal:  Negative for back pain and gait problem.  Skin:  Negative for rash.  Neurological:  Negative for dizziness, light-headedness and headaches.  Psychiatric/Behavioral:  Negative for agitation and behavioral problems.   All other systems reviewed and are negative.  Observations/Objective: Patient sounds comfortable and in no acute distress  Assessment and Plan: Problem List Items Addressed This Visit   None Visit Diagnoses     Nausea and vomiting in pregnancy    -  Primary   Relevant Medications   famotidine (PEPCID) 20 MG tablet   ondansetron (ZOFRAN-ODT) 4 MG disintegrating tablet   Other Relevant Orders   Ambulatory referral to Obstetrics / Gynecology   Gastroesophageal reflux disease without esophagitis       Relevant Medications   famotidine (  PEPCID) 20 MG tablet   ondansetron (ZOFRAN-ODT) 4 MG disintegrating tablet   Other Relevant Orders   Ambulatory referral to Obstetrics / Gynecology       Sent dissolvable zofran and pepcid and  Follow up plan: Return if symptoms worsen or fail to improve.     I discussed the assessment and treatment plan with the patient. The patient was provided an opportunity to ask questions and all were answered. The patient agreed with the plan and demonstrated an  understanding of the instructions.   The patient was advised to call back or seek an in-person evaluation if the symptoms worsen or if the condition fails to improve as anticipated.  The above assessment and management plan was discussed with the patient. The patient verbalized understanding of and has agreed to the management plan. Patient is aware to call the clinic if symptoms persist or worsen. Patient is aware when to return to the clinic for a follow-up visit. Patient educated on when it is appropriate to go to the emergency department.    I provided 9 minutes of non-face-to-face time during this encounter.    Worthy Rancher, MD

## 2021-08-31 ENCOUNTER — Inpatient Hospital Stay (HOSPITAL_COMMUNITY): Payer: BC Managed Care – PPO

## 2021-08-31 ENCOUNTER — Encounter (HOSPITAL_COMMUNITY): Payer: Self-pay | Admitting: Obstetrics and Gynecology

## 2021-08-31 ENCOUNTER — Other Ambulatory Visit: Payer: Self-pay

## 2021-08-31 ENCOUNTER — Inpatient Hospital Stay (HOSPITAL_COMMUNITY)
Admission: AD | Admit: 2021-08-31 | Discharge: 2021-08-31 | Disposition: A | Discharge status: Home / Self Care | Payer: BC Managed Care – PPO | Attending: Obstetrics and Gynecology | Admitting: Obstetrics and Gynecology

## 2021-08-31 DIAGNOSIS — O418X10 Other specified disorders of amniotic fluid and membranes, first trimester, not applicable or unspecified: Secondary | ICD-10-CM | POA: Diagnosis not present

## 2021-08-31 DIAGNOSIS — O208 Other hemorrhage in early pregnancy: Secondary | ICD-10-CM | POA: Diagnosis not present

## 2021-08-31 DIAGNOSIS — Z3A01 Less than 8 weeks gestation of pregnancy: Secondary | ICD-10-CM | POA: Diagnosis not present

## 2021-08-31 DIAGNOSIS — O26891 Other specified pregnancy related conditions, first trimester: Secondary | ICD-10-CM | POA: Diagnosis not present

## 2021-08-31 DIAGNOSIS — Z3491 Encounter for supervision of normal pregnancy, unspecified, first trimester: Secondary | ICD-10-CM | POA: Diagnosis not present

## 2021-08-31 DIAGNOSIS — N939 Abnormal uterine and vaginal bleeding, unspecified: Secondary | ICD-10-CM | POA: Diagnosis not present

## 2021-08-31 DIAGNOSIS — O468X1 Other antepartum hemorrhage, first trimester: Secondary | ICD-10-CM

## 2021-08-31 DIAGNOSIS — O209 Hemorrhage in early pregnancy, unspecified: Secondary | ICD-10-CM | POA: Diagnosis not present

## 2021-08-31 LAB — COMPREHENSIVE METABOLIC PANEL
ALT: 16 U/L (ref 0–44)
AST: 35 U/L (ref 15–41)
Albumin: 3.8 g/dL (ref 3.5–5.0)
Alkaline Phosphatase: 45 U/L (ref 38–126)
Anion gap: 9 (ref 5–15)
BUN: 7 mg/dL (ref 6–20)
CO2: 20 mmol/L — ABNORMAL LOW (ref 22–32)
Calcium: 9 mg/dL (ref 8.9–10.3)
Chloride: 103 mmol/L (ref 98–111)
Creatinine, Ser: 0.75 mg/dL (ref 0.44–1.00)
GFR, Estimated: >60 mL/min (ref >60)
Glucose, Bld: 86 mg/dL (ref 70–99)
Potassium: 4.3 mmol/L (ref 3.5–5.1)
Sodium: 132 mmol/L — ABNORMAL LOW (ref 135–145)
Total Bilirubin: 1.1 mg/dL (ref 0.3–1.2)
Total Protein: 6.9 g/dL (ref 6.5–8.1)

## 2021-08-31 LAB — CBC
HCT: 38.5 % (ref 36.0–46.0)
Hemoglobin: 12.9 g/dL (ref 12.0–15.0)
MCH: 30.6 pg (ref 26.0–34.0)
MCHC: 33.5 g/dL (ref 30.0–36.0)
MCV: 91.4 fL (ref 80.0–100.0)
Platelets: 394 10*3/uL (ref 150–400)
RBC: 4.21 MIL/uL (ref 3.87–5.11)
RDW: 12 % (ref 11.5–15.5)
WBC: 9.2 10*3/uL (ref 4.0–10.5)
nRBC: 0 % (ref 0.0–0.2)

## 2021-08-31 LAB — TYPE AND SCREEN
ABO/RH(D): O POS
Antibody Screen: NEGATIVE

## 2021-08-31 LAB — HCG, QUANTITATIVE, PREGNANCY: hCG, Beta Chain, Quant, S: 63111 m[IU]/mL — ABNORMAL HIGH (ref <5)

## 2021-08-31 MED ORDER — LACTATED RINGERS IV BOLUS
1000.0000 mL | Freq: Once | INTRAVENOUS | Status: AC
  Administered 2021-08-31: 1000 mL via INTRAVENOUS

## 2021-08-31 MED ORDER — HYDROMORPHONE HCL 1 MG/ML IJ SOLN
1.0000 mg | INTRAMUSCULAR | Status: DC | PRN
  Administered 2021-08-31: 1 mg via INTRAMUSCULAR
  Filled 2021-08-31: qty 1

## 2021-08-31 NOTE — MAU Provider Note (Signed)
History     CSN: [id number]  Arrival date and time: 08/31/21 2059   Event Date/Time   First Provider Initiated Contact with Patient 08/31/21 2122      Chief Complaint  Patient presents with   Pelvic Pain   HPI Marcia Gonzalez is a 26 y.o. G3P0020 at 31w0dwho presents to MAU with chief complaints of abdominal pressure, vaginal bleeding and concern for miscarriage in progress. Patient reports new onset vaginal spotting earlier this week. Her bleeding became heavier with multiple small clots around 8pm tonight. She also experienced new onset "stabbing" abdominal pain and urge to push at that time. Pain score on arrival to MAU is 9/10. She denies aggravating or alleviating factors. She has not taken medication. She denies abdominal tenderness, dysuria, dizziness, weakness, fever. She is remote from sexual intercourse.  OB hx significant for miscarriage x 2.   OB History     Gravida  3   Para  0   Term  0   Preterm  0   AB  2   Living  0      SAB  2   IAB  0   Ectopic  0   Multiple  0   Live Births  0           Past Medical History:  Diagnosis Date   Allergy    Anemia    Anxiety    Arthritis    Depression    Endometriosis    GERD (gastroesophageal reflux disease)    Hypothyroidism    IIH (idiopathic intracranial hypertension)    Wells' syndrome     Past Surgical History:  Procedure Laterality Date   ABDOMINAL EXPLORATION SURGERY  04/2021   DILATION AND CURETTAGE OF UTERUS     FINGER SURGERY      Family History  Problem Relation Age of Onset   Kidney disease Mother    Hypertension Mother    Hyperlipidemia Mother    Depression Mother    Cancer Mother        cervical   Arthritis Mother    Anxiety disorder Mother    Depression Father    Anxiety disorder Father    Asthma Brother    COPD Maternal Grandmother    Arthritis Maternal Grandmother     Social History   Tobacco Use   Smoking status: Never    Passive exposure: Never   Smokeless  tobacco: Never  Vaping Use   Vaping Use: Never used  Substance Use Topics   Alcohol use: Not Currently   Drug use: Never    Allergies:  Allergies  Allergen Reactions   Codeine Nausea Only   Augmentin [Amoxicillin-Pot Clavulanate] Other (See Comments)    Hallucinations   Other Rash    Latex tape, causes reaction and blisters.    Medications Prior to Admission  Medication Sig Dispense Refill Last Dose   buPROPion (WELLBUTRIN XL) 150 MG 24 hr tablet Take 150 mg by mouth daily.   08/31/2021   escitalopram (LEXAPRO) 20 MG tablet Take 1 tablet (20 mg total) by mouth daily. 90 tablet 1 08/31/2021   famotidine (PEPCID) 20 MG tablet Take 1 tablet (20 mg total) by mouth 2 (two) times daily. 60 tablet 2 08/31/2021   ferrous sulfate 325 (65 FE) MG tablet Take 325 mg by mouth daily with breakfast.   20/01/6225  folic acid (FOLVITE) 1 MG tablet Take 1 mg by mouth daily.   08/31/2021   levothyroxine (SYNTHROID) 125 MCG  tablet Take 125 mcg by mouth daily before breakfast.   08/31/2021   ondansetron (ZOFRAN-ODT) 4 MG disintegrating tablet Take 1 tablet (4 mg total) by mouth every 8 (eight) hours as needed for nausea or vomiting. 30 tablet 2 08/31/2021   Prenatal Vit-Fe Fumarate-FA (PRENATAL MULTIVITAMIN) TABS tablet Take 1 tablet by mouth daily at 12 noon.   08/31/2021   progesterone (PROMETRIUM) 200 MG capsule Take 400 mg by mouth daily.   08/31/2021   pyridOXINE (VITAMIN B-6) 100 MG tablet Take 100 mg by mouth daily.   08/31/2021    Review of Systems  Gastrointestinal:  Positive for abdominal pain.  Genitourinary:  Positive for vaginal bleeding.  All other systems reviewed and are negative. Physical Exam   Blood pressure (!) 155/87, pulse 99, temperature 98.3 F (36.8 C), temperature source Oral, resp. rate 19, last menstrual period 07/22/2021, SpO2 99 %, unknown if currently breastfeeding.  Physical Exam Vitals and nursing note reviewed. Exam conducted with a chaperone present.  Constitutional:       General: She is not in acute distress.    Appearance: Normal appearance. She is obese. She is not ill-appearing or diaphoretic.  Cardiovascular:     Rate and Rhythm: Normal rate and regular rhythm.     Pulses: Normal pulses.     Heart sounds: Normal heart sounds.  Pulmonary:     Effort: Pulmonary effort is normal.     Breath sounds: Normal breath sounds.  Abdominal:     Tenderness: There is no abdominal tenderness.  Genitourinary:    Comments: Pelvic exam: External genitalia normal, vaginal walls pink and well rugated, cervix appears slightly open.  Scant dark red bleeding noted on exam. No large clot of over heavy bleeding on arrival to MAU   Skin:    Capillary Refill: Capillary refill takes less than 2 seconds.  Neurological:     Mental Status: She is alert and oriented to person, place, and time.  Psychiatric:        Mood and Affect: Mood normal.        Behavior: Behavior normal.        Thought Content: Thought content normal.        Judgment: Judgment normal.    MAU Course/MDM  Procedures: sterile speculum exam, TVUS  --Patient met by CNM shortly after arrival due to ob history, report of urge to push.  Orders Placed This Encounter  Procedures   US OB LESS THAN 14 WEEKS WITH OB TRANSVAGINAL   CBC   Comprehensive metabolic panel   hCG, quantitative, pregnancy   Type and screen [place] peripheral IV   Patient Vitals for the past 24 hrs:  BP Temp Temp src Pulse Resp SpO2  08/31/21 2246 120/72 -- -- 65 -- 99 %  08/31/21 2243 136/74 -- -- 68 -- --  08/31/21 2150 131/71 -- -- 78 -- --  08/31/21 2116 (!) 155/87 98.3 F (36.8 C) Oral 99 19 99 %   Results for orders placed or performed during the hospital encounter of 08/31/21 (from the past 24 hour(s))  CBC     Status: None   Collection Time: 08/31/21  9:50 PM  Result Value Ref Range   WBC 9.2 4.0 - 10.5 K/uL   RBC 4.21 3.87 - 5.11 MIL/uL   Hemoglobin 12.9 12.0 - 15.0 g/dL   HCT  38.5 36.0 - 46.0 %   MCV 91.4 80.0 - 100.0 fL   MCH 30.6 26.0 -  34.0 pg   MCHC 33.5 30.0 - 36.0 g/dL   RDW 12.0 11.5 - 15.5 %   Platelets 394 150 - 400 K/uL   nRBC 0.0 0.0 - 0.2 %  Comprehensive metabolic panel     Status: Abnormal   Collection Time: 08/31/21  9:50 PM  Result Value Ref Range   Sodium 132 (L) 135 - 145 mmol/L   Potassium 4.3 3.5 - 5.1 mmol/L   Chloride 103 98 - 111 mmol/L   CO2 20 (L) 22 - 32 mmol/L   Glucose, Bld 86 70 - 99 mg/dL   BUN 7 6 - 20 mg/dL   Creatinine, Ser 0.75 0.44 - 1.00 mg/dL   Calcium 9.0 8.9 - 10.3 mg/dL   Total Protein 6.9 6.5 - 8.1 g/dL   Albumin 3.8 3.5 - 5.0 g/dL   AST 35 15 - 41 U/L   ALT 16 0 - 44 U/L   Alkaline Phosphatase 45 38 - 126 U/L   Total Bilirubin 1.1 0.3 - 1.2 mg/dL   GFR, Estimated >60 >60 mL/min   Anion gap 9 5 - 15  hCG, quantitative, pregnancy     Status: Abnormal   Collection Time: 08/31/21  9:50 PM  Result Value Ref Range   hCG, Beta Chain, Quant, S 63,111 (H) <5 mIU/mL  Type and screen [place]     Status: None   Collection Time: 08/31/21  9:58 PM  Result Value Ref Range   ABO/RH(D) O POS    Antibody Screen NEG    Sample Expiration      09/03/2021,2359 Performed at [facility], [address], [place], [place] [address]    US OB LESS THAN 14 WEEKS WITH OB TRANSVAGINAL  Result Date: 08/31/2021 CLINICAL DATA:  Heavy vaginal bleeding EXAM: OBSTETRIC <14 WK [place] AND TRANSVAGINAL OB US TECHNIQUE: Both transabdominal and transvaginal ultrasound examinations were performed for complete evaluation of the gestation as well as the maternal uterus, adnexal regions, and pelvic cul-de-sac. Transvaginal technique was performed to assess early pregnancy. COMPARISON:  None. FINDINGS: Intrauterine gestational sac: Single Yolk sac:  Visualized. Embryo:  Visualized. Cardiac Activity: Visualized. Heart Rate: 113 bpm MSD:   mm    w     d CRL:  7.3 mm   6 w   4 d                  [place] EDC: 04/22/2022 Subchorionic  hemorrhage:  Large subchorionic hemorrhage. Maternal uterus/adnexae: No adnexal mass or free fluid. IMPRESSION: Six week 4 day intrauterine pregnancy. Fetal heart rate 113 beats per minute. Large subchorionic hemorrhage. Electronically Signed   By: Rolm Baptise M.D.   On: 08/31/2021 22:47    Assessment and Plan  --26 y.o. G3P0020 with live IUP at [redacted]w[redacted]d --Large subchorionic hematoma, pelvic rest advised --Hgb 12.9 --Blood type O POS --Bleeding precautions reviewed with patient and husband JDarlyn Chamber--Discharge home in stable condition  SDarlina Rumpf MSN, [facility] [place] CNM 08/31/2021, 11:55 PM

## 2021-08-31 NOTE — MAU Note (Incomplete)
Pt and husband very excited with the news of US-pt crying-no longer feels vaginal pressure

## 2021-08-31 NOTE — Discharge Instructions (Signed)
Prenatal Care Providers           [facility]  [address] [phone]  [facility]   [address]  [phone]  [facility] @ [place]       [address] [phone]            [facility] @ [place]     [address] [phone]          [facility] @ [place]   [address] [phone]  [facility]  [address] [phone]     [facility] ()  [address]   [phone]     [facility]  Phone: [phone]  [facility]  Phone: [phone]  [facility] Phone: [phone]  [facility] Phone: [phone]  [facility] Phone: [phone]  [facility] Phone: [phone]  [facility]  Phone: [phone]  

## 2021-08-31 NOTE — MAU Note (Addendum)
Marcia Gonzalez is a 26 y.o. at [redacted]w[redacted]d here in MAU reporting stabbing lower abdominal and vaginal  bleeding and clots that began at 8pm this evening. Pt reports she is 5-[redacted] weeks pregnant Pt is currently rating her pain at 8-9 and feels urge to push. Pain score: 8-9 Vitals:   08/31/21 2116  BP: (!) 155/87  Pulse: 99  Resp: 19  Temp: 98.3 F (36.8 C)  SpO2: 99%

## 2021-09-09 ENCOUNTER — Inpatient Hospital Stay (HOSPITAL_COMMUNITY)
Admission: AD | Admit: 2021-09-09 | Discharge: 2021-09-09 | Disposition: A | Discharge status: Home / Self Care | Payer: BC Managed Care – PPO | Attending: Obstetrics & Gynecology | Admitting: Obstetrics & Gynecology

## 2021-09-09 ENCOUNTER — Other Ambulatory Visit: Payer: Self-pay

## 2021-09-09 DIAGNOSIS — Z5321 Procedure and treatment not carried out due to patient leaving prior to being seen by health care provider: Secondary | ICD-10-CM | POA: Diagnosis not present

## 2021-09-09 LAB — URINALYSIS, ROUTINE W REFLEX MICROSCOPIC
Bilirubin Urine: NEGATIVE
Glucose, UA: NEGATIVE mg/dL
Hgb urine dipstick: NEGATIVE
Ketones, ur: NEGATIVE mg/dL
Leukocytes,Ua: NEGATIVE
Nitrite: NEGATIVE
Protein, ur: NEGATIVE mg/dL
Specific Gravity, Urine: 1.023 (ref 1.005–1.030)
pH: 6 (ref 5.0–8.0)

## 2021-09-09 MED ORDER — ONDANSETRON 4 MG PO TBDP: 8.0000 mg | ORAL_TABLET | Freq: Once | ORAL | Status: DC

## 2021-09-09 NOTE — MAU Note (Signed)
Went to call pt in lobby and offer Zofran while she waited for a room and pt had left per admission personnel

## 2021-09-09 NOTE — MAU Note (Signed)
Presents with c/o N/V, reports unable to keep anything down.  States hasn't eaten in 2-3 days. Denies VB, has brown discharge.  Has subchorionic hemorrhage.

## 2021-09-12 ENCOUNTER — Encounter: Payer: Self-pay | Admitting: *Deleted

## 2021-09-16 ENCOUNTER — Telehealth: Payer: Self-pay | Admitting: Family Medicine

## 2021-09-16 ENCOUNTER — Other Ambulatory Visit: Payer: BC Managed Care – PPO

## 2021-09-16 ENCOUNTER — Telehealth: Payer: Self-pay | Admitting: General Practice

## 2021-09-16 DIAGNOSIS — Z3490 Encounter for supervision of normal pregnancy, unspecified, unspecified trimester: Secondary | ICD-10-CM | POA: Diagnosis not present

## 2021-09-16 DIAGNOSIS — E038 Other specified hypothyroidism: Secondary | ICD-10-CM | POA: Diagnosis not present

## 2021-09-16 DIAGNOSIS — E063 Autoimmune thyroiditis: Secondary | ICD-10-CM | POA: Diagnosis not present

## 2021-09-16 NOTE — Telephone Encounter (Signed)
Patient called and left message on nurse voicemail line stating she hasn't started care with [place] yet but has her first appt with [place] in March. She states her last Ob/Gyn told her to start progesterone as soon as she got pregnant next. Patient states she has some progesterone 200mg  capsules now but doesn't have enough to make it until her appt with [place] and is requesting a refill. Discussed with Dr Dione Plover who recommends patient seek refill from previous provider.   Called patient back and discussed with her since she is not established with [place] yet and we usually do not prescribe progesterone for early pregnancy loss prevention, to seek refill from previous OB. Patient verbalized understanding & asked about intake appt next week. Discussed appt with patient. Patient verbalized understanding & had no other questions at this time.

## 2021-09-16 NOTE — Telephone Encounter (Signed)
Patient aware we do not do that here.

## 2021-09-19 ENCOUNTER — Telehealth: Payer: BC Managed Care – PPO | Admitting: Nurse Practitioner

## 2021-09-19 ENCOUNTER — Ambulatory Visit (INDEPENDENT_AMBULATORY_CARE_PROVIDER_SITE_OTHER): Payer: BC Managed Care – PPO | Admitting: Nurse Practitioner

## 2021-09-19 ENCOUNTER — Telehealth: Payer: BC Managed Care – PPO | Admitting: Family Medicine

## 2021-09-19 ENCOUNTER — Encounter: Payer: Self-pay | Admitting: Nurse Practitioner

## 2021-09-19 VITALS — BP 128/80 | HR 81 | Ht 65.0 in | Wt 237.0 lb

## 2021-09-19 DIAGNOSIS — J069 Acute upper respiratory infection, unspecified: Secondary | ICD-10-CM | POA: Diagnosis not present

## 2021-09-19 NOTE — Progress Notes (Signed)
? ? ? ?Acute Office Visit ? ?Subjective:  ? ? Patient ID: Marcia Gonzalez, female    DOB: 04-Feb-1996, 25 y.o.   MRN: [id number] ? ?Chief Complaint  ?Patient presents with  ? cold/flu like symptoms  ? flu/cold like symptoms  ?  Sinus X 2 days ?Fever started today ? ?Patient is pregnant (9 weeks)  ? ? ?URI  ?This is a new problem. The current episode started yesterday. The problem has been unchanged. There has been no fever. Associated symptoms include congestion, coughing and nausea. Pertinent negatives include no abdominal pain, headaches, neck pain or rash. She has tried nothing for the symptoms.  ?Patient is [redacted] weeks pregnant.  Also has concerns for the health of her baby.  Education provided to patient that at 8 to 9 weeks it is a little bit too early to hear baby's with Doppler. ? ?Past Medical History:  ?Diagnosis Date  ? Allergy   ? Anemia   ? Anxiety   ? Arthritis   ? Depression   ? Endometriosis   ? GERD (gastroesophageal reflux disease)   ? Hypothyroidism   ? IIH (idiopathic intracranial hypertension)   ? Wells' syndrome   ? ? ?Past Surgical History:  ?Procedure Laterality Date  ? ABDOMINAL EXPLORATION SURGERY  04/2021  ? DILATION AND CURETTAGE OF UTERUS    ? FINGER SURGERY    ? ? ?Family History  ?Problem Relation Age of Onset  ? Kidney disease Mother   ? Hypertension Mother   ? Hyperlipidemia Mother   ? Depression Mother   ? Cancer Mother   ?     cervical  ? Arthritis Mother   ? Anxiety disorder Mother   ? Depression Father   ? Anxiety disorder Father   ? Asthma Brother   ? COPD Maternal Grandmother   ? Arthritis Maternal Grandmother   ? ? ?Social History  ? ?Socioeconomic History  ? Marital status: Married  ?  Spouse name: Darlyn Chamber  ? Number of children: 0  ? Years of education: 60  ? Highest education level: High school graduate  ?Occupational History  ? Not on file  ?Tobacco Use  ? Smoking status: Never  ?  Passive exposure: Never  ? Smokeless tobacco: Never  ?Vaping Use  ? Vaping Use: Never used   ?Substance and Sexual Activity  ? Alcohol use: Not Currently  ? Drug use: Never  ? Sexual activity: Yes  ?  Birth control/protection: None  ?Other Topics Concern  ? Not on file  ?Social History Narrative  ? Not on file  ? ?Social Determinants of Health  ? ?Financial Resource Strain: Not on file  ?Food Insecurity: Not on file  ?Transportation Needs: Not on file  ?Physical Activity: Not on file  ?Stress: Not on file  ?Social Connections: Not on file  ?Intimate Partner Violence: Not on file  ? ? ?Outpatient Medications Prior to Visit  ?Medication Sig Dispense Refill  ? buPROPion (WELLBUTRIN XL) 150 MG 24 hr tablet Take 150 mg by mouth daily.    ? escitalopram (LEXAPRO) 20 MG tablet Take 1 tablet (20 mg total) by mouth daily. 90 tablet 1  ? famotidine (PEPCID) 20 MG tablet Take 1 tablet (20 mg total) by mouth 2 (two) times daily. 60 tablet 2  ? ferrous sulfate 325 (65 FE) MG tablet Take 325 mg by mouth daily with breakfast.    ? folic acid (FOLVITE) 1 MG tablet Take 1 mg by mouth daily.    ? levothyroxine (  SYNTHROID) 125 MCG tablet Take 125 mcg by mouth daily before breakfast.    ? ondansetron (ZOFRAN-ODT) 4 MG disintegrating tablet Take 1 tablet (4 mg total) by mouth every 8 (eight) hours as needed for nausea or vomiting. 30 tablet 2  ? Prenatal Vit-Fe Fumarate-FA (PRENATAL MULTIVITAMIN) TABS tablet Take 1 tablet by mouth daily at 12 noon.    ? progesterone (PROMETRIUM) 200 MG capsule Take 400 mg by mouth daily.    ? pyridOXINE (VITAMIN B-6) 100 MG tablet Take 100 mg by mouth daily.    ? ?No facility-administered medications prior to visit.  ? ? ?Allergies  ?Allergen Reactions  ? Codeine Nausea Only  ? Oxycodone-Acetaminophen Hives and Itching  ? Augmentin [Amoxicillin-Pot Clavulanate] Other (See Comments)  ?  Hallucinations  ? Other Rash  ?  Latex tape, causes reaction and blisters.  ? ? ?Review of Systems  ?HENT:  Positive for congestion.   ?Eyes: Negative.   ?Respiratory:  Positive for cough.   ?Gastrointestinal:   Positive for nausea. Negative for abdominal pain.  ?Musculoskeletal:  Negative for neck pain.  ?Skin:  Negative for rash.  ?Neurological:  Negative for headaches.  ?All other systems reviewed and are negative. ? ?   ?Objective:  ?  ?Physical Exam ?Vitals and nursing note reviewed.  ?Constitutional:   ?   Appearance: She is obese.  ?HENT:  ?   Head: Normocephalic.  ?   Nose: Congestion present.  ?   Mouth/Throat:  ?   Mouth: Mucous membranes are moist.  ?   Pharynx: Oropharynx is clear.  ?Eyes:  ?   Conjunctiva/sclera: Conjunctivae normal.  ?Cardiovascular:  ?   Rate and Rhythm: Normal rate and regular rhythm.  ?   Pulses: Normal pulses.  ?   Heart sounds: Normal heart sounds.  ?Pulmonary:  ?   Effort: Pulmonary effort is normal.  ?   Breath sounds: Normal breath sounds.  ?Abdominal:  ?   General: Bowel sounds are normal.  ?Musculoskeletal:     ?   General: Normal range of motion.  ?Skin: ?   General: Skin is warm.  ?   Findings: No rash.  ?Neurological:  ?   General: No focal deficit present.  ?   Mental Status: She is alert and oriented to person, place, and time.  ? ? ?BP 128/80   Pulse 81   Ht _0  (1.651 m)   Wt 237 lb (107.5 kg)   LMP 07/22/2021 (Exact Date)   SpO2 94%   BMI 39.44 kg/m?  ?Wt Readings from Last 3 Encounters:  ?09/19/21 237 lb (107.5 kg)  ?09/09/21 238 lb 6.4 oz (108.1 kg)  ?08/22/21 241 lb 6 oz (109.5 kg)  ? ? ?There are no preventive care reminders to display for this patient. ? ?There are no preventive care reminders to display for this patient. ? ? ?Lab Results  ?Component Value Date  ? TSH 0.304 (L) 06/30/2021  ? ?Lab Results  ?Component Value Date  ? WBC 9.2 08/31/2021  ? HGB 12.9 08/31/2021  ? HCT 38.5 08/31/2021  ? MCV 91.4 08/31/2021  ? PLT 394 08/31/2021  ? ?Lab Results  ?Component Value Date  ? NA 132 (L) 08/31/2021  ? K 4.3 08/31/2021  ? CO2 20 (L) 08/31/2021  ? GLUCOSE 86 08/31/2021  ? BUN 7 08/31/2021  ? CREATININE 0.75 08/31/2021  ? BILITOT 1.1 08/31/2021  ? ALKPHOS 45  08/31/2021  ? AST 35 08/31/2021  ? ALT 16 08/31/2021  ?  PROT 6.9 08/31/2021  ? ALBUMIN 3.8 08/31/2021  ? CALCIUM 9.0 08/31/2021  ? ANIONGAP 9 08/31/2021  ? EGFR 125 08/22/2021  ? ?Lab Results  ?Component Value Date  ? CHOL 220 (H) 06/30/2021  ? ?Lab Results  ?Component Value Date  ? HDL 44 06/30/2021  ? ?Lab Results  ?Component Value Date  ? LDLCALC 147 (H) 06/30/2021  ? ?Lab Results  ?Component Value Date  ? TRIG 160 (H) 06/30/2021  ? ?Lab Results  ?Component Value Date  ? CHOLHDL 5.0 (H) 06/30/2021  ? ?No results found for: HGBA1C ? ?   ?Assessment & Plan:  ?Patient presents with viral URI ?- Use a cool mist humidifier  ?-Use saline nose sprays frequently ?-Force fluids ?-For fever or aches or pains- take Tylenol  ?-Patient did not want a COVID test today. ?-If symptoms do not improve, she may need to be COVID tested to rule this out ?Follow up with worsening unresolved symptoms  ?Problem List Items Addressed This Visit   ?None ?Visit Diagnoses   ? ? Upper respiratory tract infection, unspecified type    -  Primary  ? ?  ? ? ? ?No orders of the defined types were placed in this encounter. ? ? ? ?Ivy Lynn, NP ? ?

## 2021-09-19 NOTE — Patient Instructions (Signed)

## 2021-09-23 ENCOUNTER — Telehealth (INDEPENDENT_AMBULATORY_CARE_PROVIDER_SITE_OTHER): Payer: Medicaid Other

## 2021-09-23 DIAGNOSIS — Z3490 Encounter for supervision of normal pregnancy, unspecified, unspecified trimester: Secondary | ICD-10-CM

## 2021-09-23 DIAGNOSIS — O99280 Endocrine, nutritional and metabolic diseases complicating pregnancy, unspecified trimester: Secondary | ICD-10-CM

## 2021-09-23 DIAGNOSIS — O09299 Supervision of pregnancy with other poor reproductive or obstetric history, unspecified trimester: Secondary | ICD-10-CM

## 2021-09-23 DIAGNOSIS — O99281 Endocrine, nutritional and metabolic diseases complicating pregnancy, first trimester: Secondary | ICD-10-CM

## 2021-09-23 DIAGNOSIS — Z3A00 Weeks of gestation of pregnancy not specified: Secondary | ICD-10-CM

## 2021-09-23 DIAGNOSIS — E039 Hypothyroidism, unspecified: Secondary | ICD-10-CM

## 2021-09-23 NOTE — Progress Notes (Signed)
New OB Intake ? ?I connected with  Marcia Gonzalez on 09/23/21 at  2:15 PM EST by MyChart Video Visit and verified that I am speaking with the correct person using two identifiers. Nurse is located at [facility] and pt is located at home. ? ?I discussed the limitations, risks, security and privacy concerns of performing an evaluation and management service by telephone and the availability of in person appointments. I also discussed with the patient that there may be a patient responsible charge related to this service. The patient expressed understanding and agreed to proceed. ? ?I explained I am completing New OB Intake today. We discussed her EDD of 04/22/2022 that is based on her first trimester ultrasound on 08/31/21. Pt is G3/P0. I reviewed her allergies, medications, Medical/Surgical/OB history, and appropriate screenings. I informed her of [facility] services. Based on history, this is a/an  pregnancy complicated by hypothyroidism  .  ? ?Patient Active Problem List  ? Diagnosis Date Noted  ? Supervision of low-risk pregnancy 09/23/2021  ? Hypothyroidism 06/30/2021  ? Anemia 06/30/2021  ? Depression, major, recurrent, moderate (HCC) 06/30/2021  ? Generalized anxiety disorder 06/30/2021  ? Class 2 severe obesity due to excess calories with serious comorbidity and body mass index (BMI) of 39.0 to 39.9 in adult [facility]) 06/30/2021  ? ? ?Concerns addressed today ?US OB Transvaginal scheduled for 10/01/21 @ 1530 arrival for history of miscarriage x 2.  Patient will begin prenatal care at [facility] as her travel distance is shorter.  Appt scheduled with [facility] on 10/21/21.   ? ?Delivery Plans:  ?Plans to deliver at [facility].  ? ?MyChart/Babyscripts ?MyChart access verified. I explained pt will have some visits in office and some virtually. Babyscripts instructions given and order placed. Patient verifies receipt of registration text/e-mail. Account successfully created and app downloaded. ? ?Blood Pressure Cuff  ?Patient  has private insurance; instructed to purchase blood pressure cuff and bring to first prenatal appt. Explained after first prenatal appt pt will check weekly and document in Babyscripts. ? ?Weight scale: Patient does have weight scale. Weight scale ordered for patient to pick up from [employer].  ? ?Anatomy US ?Explained first scheduled [place] will be around 19 weeks. Anatomy US scheduled for 11/26/21 at 1415.  ? ?Labs ?Discussed Avelina Laine genetic screening with patient. Would like both Panorama and Horizon drawn at new OB visit.Also if interested in genetic testing, tell patient she will need AFP 15-21 weeks to complete genetic testing .Routine prenatal labs needed. ? ?Covid Vaccine ?Patient has not covid vaccine.  ? ?Is patient a CenteringPregnancy candidate?  Patient will start care at [facility].     ? ?Is patient a Mom+Baby Combined Care candidate?  Patient will be starting care at [facility].      ? ?Informed patient of [facility] website  and placed link in her AVS.  ? ?Social Determinants of Health ?Food Insecurity: Patient denies food insecurity. ?WIC Referral: Patient is interested in referral to [facility].  ?Transportation: Patient denies transportation needs. ?Childcare: Discussed no children allowed at ultrasound appointments. Offered childcare services; patient declines childcare services at this time. ? ? ?Placed OB Box on problem list and updated ? ?First visit review ?I reviewed new OB appt with pt. I explained she will have a pelvic exam, ob bloodwork with genetic screening, and PAP smear. Explained pt will be seen by at [facility] at first visit; encounter routed to appropriate provider. Explained that patient will be seen by pregnancy  navigator following visit with provider. [facility] information placed in AVS.  ? ?Ralene Bathe, RN ?09/23/2021  2:53 PM  ?

## 2021-10-01 ENCOUNTER — Ambulatory Visit (INDEPENDENT_AMBULATORY_CARE_PROVIDER_SITE_OTHER): Payer: Medicaid Other | Admitting: General Practice

## 2021-10-01 ENCOUNTER — Ambulatory Visit
Admission: RE | Admit: 2021-10-01 | Discharge: 2021-10-01 | Disposition: A | Discharge status: Home / Self Care | Payer: BC Managed Care – PPO | Source: Ambulatory Visit | Attending: Family Medicine | Admitting: Family Medicine

## 2021-10-01 ENCOUNTER — Other Ambulatory Visit: Payer: Self-pay

## 2021-10-01 VITALS — BP 118/71 | HR 83

## 2021-10-01 DIAGNOSIS — O09299 Supervision of pregnancy with other poor reproductive or obstetric history, unspecified trimester: Secondary | ICD-10-CM | POA: Diagnosis not present

## 2021-10-01 DIAGNOSIS — R309 Painful micturition, unspecified: Secondary | ICD-10-CM

## 2021-10-01 DIAGNOSIS — Z3491 Encounter for supervision of normal pregnancy, unspecified, first trimester: Secondary | ICD-10-CM

## 2021-10-01 LAB — POCT URINALYSIS DIP (DEVICE)
Bilirubin Urine: NEGATIVE
Glucose, UA: NEGATIVE mg/dL
Ketones, ur: NEGATIVE mg/dL
Leukocytes,Ua: NEGATIVE
Nitrite: NEGATIVE
Protein, ur: NEGATIVE mg/dL
Specific Gravity, Urine: >=1.03 (ref 1.005–1.030)
Urobilinogen, UA: 0.2 mg/dL (ref 0.0–1.0)
pH: 5.5 (ref 5.0–8.0)

## 2021-10-01 NOTE — Progress Notes (Signed)
Patient presents to office today for follow up ultrasound results. Reviewed results with Dr Crissie Reese who finds single living IUP with persistent large [facility]. Reviewed results with patient at length including dating. Patient has concerns about increased risk of pre-e or other dangers to the baby. Discussed with patient the biggest risk is miscarriage but it is reassuring she has made it to 11 weeks. She denies bleeding in the last 2 weeks. Patient reports lower abdominal/pelvic pain with urination and bowel movements. She does report constipation as well. Recommended increasing hydration, walking, high fiber foods, miralax prn, or smooth move tea. UA is unremarkable for UTI. Patient will follow up at [facility] office on 4/4 and is aware of bleeding precautions. ? ?Chase Caller RN BSN ?10/01/21 ? ?

## 2021-10-02 ENCOUNTER — Encounter (HOSPITAL_COMMUNITY): Payer: Self-pay | Admitting: Obstetrics and Gynecology

## 2021-10-02 ENCOUNTER — Inpatient Hospital Stay (HOSPITAL_COMMUNITY)
Admission: AD | Admit: 2021-10-02 | Discharge: 2021-10-03 | Disposition: A | Discharge status: Home / Self Care | Payer: BC Managed Care – PPO | Attending: Obstetrics and Gynecology | Admitting: Obstetrics and Gynecology

## 2021-10-02 ENCOUNTER — Other Ambulatory Visit: Payer: Self-pay

## 2021-10-02 DIAGNOSIS — K5903 Drug induced constipation: Secondary | ICD-10-CM | POA: Insufficient documentation

## 2021-10-02 DIAGNOSIS — K5900 Constipation, unspecified: Secondary | ICD-10-CM

## 2021-10-02 DIAGNOSIS — T50905A Adverse effect of unspecified drugs, medicaments and biological substances, initial encounter: Secondary | ICD-10-CM | POA: Insufficient documentation

## 2021-10-02 DIAGNOSIS — O26891 Other specified pregnancy related conditions, first trimester: Secondary | ICD-10-CM | POA: Insufficient documentation

## 2021-10-02 DIAGNOSIS — Z3A11 11 weeks gestation of pregnancy: Secondary | ICD-10-CM | POA: Insufficient documentation

## 2021-10-02 LAB — URINALYSIS, ROUTINE W REFLEX MICROSCOPIC
Bilirubin Urine: NEGATIVE
Glucose, UA: 50 mg/dL — AB
Ketones, ur: NEGATIVE mg/dL
Leukocytes,Ua: NEGATIVE
Nitrite: NEGATIVE
Protein, ur: NEGATIVE mg/dL
Specific Gravity, Urine: 1.029 (ref 1.005–1.030)
pH: 5 (ref 5.0–8.0)

## 2021-10-02 NOTE — MAU Note (Signed)
I am having some lower abd pain and having trouble having a bowel movement. My stomach is hard and feels like "I am carrying a rock around". I only go once or twice a wk. Last BM was today but was only a small amt after a fleets enema. Denies VB. Some yellow vag d/c ?

## 2021-10-02 NOTE — MAU Provider Note (Addendum)
?History  ?  ? ?CSN: [id number] ? ?Arrival date and time: 10/02/21 2304 ? ? Event Date/Time  ? First Provider Initiated Contact with Patient 10/02/21 2347   ?  ? ?Chief Complaint  ?Patient presents with  ? Abdominal Pain  ? Constipation  ? ?HPI ?Marcia Gonzalez is a 26 y.o. G3P0020 at 747w2d who presents with constipation and abdominal pain. She reports she has been struggling with bowel movements and only getting small amounts out. She reports she is taking her prenatal vitamins, iron pills and zofran frequently. She reports some lower abdominal cramping that she rates a 4/10. She denies any bleeding or discharge. ? ?OB History   ? ? Gravida  ?3  ? Para  ?0  ? Term  ?0  ? Preterm  ?0  ? AB  ?2  ? Living  ?0  ?  ? ? SAB  ?2  ? IAB  ?0  ? Ectopic  ?0  ? Multiple  ?0  ? Live Births  ?0  ?   ?  ?  ? ? ?Past Medical History:  ?Diagnosis Date  ? Allergy   ? Anemia   ? Anxiety   ? Arthritis   ? Depression   ? Endometriosis   ? GERD (gastroesophageal reflux disease)   ? Hypothyroidism   ? IIH (idiopathic intracranial hypertension)   ? Wells' syndrome   ? ? ?Past Surgical History:  ?Procedure Laterality Date  ? ABDOMINAL EXPLORATION SURGERY  04/2021  ? DILATION AND CURETTAGE OF UTERUS    ? FINGER SURGERY    ? ? ?Family History  ?Problem Relation Age of Onset  ? Kidney disease Mother   ? Hypertension Mother   ? Hyperlipidemia Mother   ? Depression Mother   ? Cancer Mother   ?     cervical  ? Arthritis Mother   ? Anxiety disorder Mother   ? Depression Father   ? Anxiety disorder Father   ? Asthma Brother   ? COPD Maternal Grandmother   ? Arthritis Maternal Grandmother   ? ? ?Social History  ? ?Tobacco Use  ? Smoking status: Never  ?  Passive exposure: Never  ? Smokeless tobacco: Never  ?Vaping Use  ? Vaping Use: Never used  ?Substance Use Topics  ? Alcohol use: Not Currently  ? Drug use: Never  ? ? ?Allergies:  ?Allergies  ?Allergen Reactions  ? Codeine Nausea Only  ? Oxycodone-Acetaminophen Hives and Itching  ? Augmentin  [Amoxicillin-Pot Clavulanate] Other (See Comments)  ?  Hallucinations  ? Other Rash  ?  Latex tape, causes reaction and blisters.  ? ? ?Medications Prior to Admission  ?Medication Sig Dispense Refill Last Dose  ? buPROPion (WELLBUTRIN XL) 150 MG 24 hr tablet Take 150 mg by mouth daily.   10/02/2021  ? escitalopram (LEXAPRO) 20 MG tablet Take 1 tablet (20 mg total) by mouth daily. 90 tablet 1 10/02/2021  ? famotidine (PEPCID) 20 MG tablet Take 1 tablet (20 mg total) by mouth 2 (two) times daily. 60 tablet 2 10/02/2021  ? ferrous sulfate 325 (65 FE) MG tablet Take 325 mg by mouth daily with breakfast.   10/02/2021  ? levothyroxine (SYNTHROID) 125 MCG tablet Take 112 mcg by mouth daily before breakfast.   10/02/2021  ? ondansetron (ZOFRAN-ODT) 4 MG disintegrating tablet Take 1 tablet (4 mg total) by mouth every 8 (eight) hours as needed for nausea or vomiting. 30 tablet 2 10/02/2021  ? Prenatal Vit-Fe Fumarate-FA (PRENATAL MULTIVITAMIN) TABS tablet  Take 1 tablet by mouth daily at 12 noon.   10/02/2021  ? progesterone (PROMETRIUM) 200 MG capsule Take 400 mg by mouth daily.   10/01/2021  ? ? ?Review of Systems  ?Constitutional: Negative.  Negative for fatigue and fever.  ?HENT: Negative.    ?Respiratory: Negative.  Negative for shortness of breath.   ?Cardiovascular: Negative.  Negative for chest pain.  ?Gastrointestinal:  Positive for abdominal pain and constipation. Negative for diarrhea, nausea and vomiting.  ?Genitourinary: Negative.  Negative for dysuria, vaginal bleeding and vaginal discharge.  ?Neurological: Negative.  Negative for dizziness and headaches.  ?Physical Exam  ? ?Blood pressure 131/80, pulse 90, temperature (!) 97.3 ?F (36.3 ?C), resp. rate 17, height 5\' 5"  (1.651 m), weight 107 kg, last menstrual period 07/22/2021, SpO2 100 %, unknown if currently breastfeeding. ? ?Physical Exam ?Vitals and nursing note reviewed.  ?Constitutional:   ?   General: She is not in acute distress. ?   Appearance: She is  well-developed.  ?HENT:  ?   Head: Normocephalic.  ?Eyes:  ?   Pupils: Pupils are equal, round, and reactive to light.  ?Cardiovascular:  ?   Rate and Rhythm: Normal rate and regular rhythm.  ?   Heart sounds: Normal heart sounds.  ?Pulmonary:  ?   Effort: Pulmonary effort is normal. No respiratory distress.  ?   Breath sounds: Normal breath sounds.  ?Abdominal:  ?   General: Bowel sounds are normal. There is no distension.  ?   Palpations: Abdomen is soft.  ?   Tenderness: There is no abdominal tenderness.  ?Skin: ?   General: Skin is warm and dry.  ?Neurological:  ?   Mental Status: She is alert and oriented to person, place, and time.  ?Psychiatric:     ?   Mood and Affect: Mood normal.     ?   Behavior: Behavior normal.     ?   Thought Content: Thought content normal.     ?   Judgment: Judgment normal.  ? ? ?MAU Course  ?Procedures ?Results for orders placed or performed during the hospital encounter of 10/02/21 (from the past 24 hour(s))  ?Urinalysis, Routine w reflex microscopic Urine, Clean Catch     Status: Abnormal  ? Collection Time: 10/02/21 11:30 PM  ?Result Value Ref Range  ? Color, Urine YELLOW YELLOW  ? APPearance HAZY (A) CLEAR  ? Specific Gravity, Urine 1.029 1.005 - 1.030  ? pH 5.0 5.0 - 8.0  ? Glucose, UA 50 (A) NEGATIVE mg/dL  ? Hgb urine dipstick SMALL (A) NEGATIVE  ? Bilirubin Urine NEGATIVE NEGATIVE  ? Ketones, ur NEGATIVE NEGATIVE mg/dL  ? Protein, ur NEGATIVE NEGATIVE mg/dL  ? Nitrite NEGATIVE NEGATIVE  ? Leukocytes,Ua NEGATIVE NEGATIVE  ? RBC / HPF 0-5 0 - 5 RBC/hpf  ? WBC, UA 0-5 0 - 5 WBC/hpf  ? Bacteria, UA RARE (A) NONE SEEN  ? Squamous Epithelial / LPF 0-5 0 - 5  ? Mucus PRESENT   ? Ca Oxalate Crys, UA PRESENT   ?  ?MDM ?UA ?Soap Suds Enema ? ?Pt informed that the ultrasound is considered a limited OB ultrasound and is not intended to be a complete ultrasound exam.  Patient also informed that the ultrasound is not being completed with the intent of assessing for fetal or placental  anomalies or any pelvic abnormalities.  Explained that the purpose of today?s ultrasound is to assess for  viability.  Patient acknowledges the purpose of the exam  and the limitations of the study.   ? ?Active fetus with FHR 137 bpm  ? ?Care turned over to S. Jansel Vonstein at 0100.  ?Rolm Bookbinder, CNM ?10/03/21 ?1:03 AM ? ?--Additional bowel movement following administration of remainder of enema. Patient verbalizes feeling better, agreeable with discharge home. Requests Phenergan prior to d/c ? ?Patient Vitals for the past 24 hrs: ? BP Temp Pulse Resp SpO2 Height Weight  ?10/03/21 0126 133/73 -- 74 -- -- -- --  ?10/02/21 2323 131/80 -- -- -- -- -- --  ?10/02/21 2321 -- (!) 97.3 ?F (36.3 ?C) 90 17 100 % 5\' 5"  (1.651 m) 107 kg  ? ? ?Meds ordered this encounter  ?Medications  ? promethazine (PHENERGAN) 12.5 MG tablet  ?  Sig: Take 1 tablet (12.5 mg total) by mouth every 6 (six) hours as needed for nausea or vomiting.  ?  Dispense:  30 tablet  ?  Refill:  0  ?  Order Specific Question:   Supervising Provider  ?  Answer Rosalie Bing  ? docusate sodium (COLACE) 100 MG capsule  ?  Sig: Take 1 capsule (100 mg total) by mouth 2 (two) times daily as needed.  ?  Dispense:  30 capsule  ?  Refill:  2  ?  Order Specific Question:   Supervising Provider  ?  Answer[[id number]] Neligh Bing  ? polyethylene glycol (MIRALAX) 17 g packet  ?  Sig: Take 17 g by mouth daily.  ?  Dispense:  14 each  ?  Refill:  0  ?  Order Specific Question:   Supervising Provider  ?  Answer[[id number]] [place] Bing  ? promethazine (PHENERGAN) tablet 25 mg  ? ?Assessment and Plan  ?--26 y.o. G3P0020 at [redacted]w[redacted]d  ?--FHT 137 BSUS ?--Constipation exacerbated by Zofran and oral Fe use ?--S/p multiple bowel movements in MAU ?--New outpatient regimen, nutrition guidelines to mitigate constipation ?--Discharge home in stable condition ? ?[redacted]w[redacted]d, MSA, MSN, CNM ?Certified Nurse Midwife, Faculty Practice ?[facility], [facility] ? ?

## 2021-10-03 DIAGNOSIS — O99611 Diseases of the digestive system complicating pregnancy, first trimester: Secondary | ICD-10-CM

## 2021-10-03 DIAGNOSIS — K5900 Constipation, unspecified: Secondary | ICD-10-CM | POA: Diagnosis not present

## 2021-10-03 DIAGNOSIS — T50905A Adverse effect of unspecified drugs, medicaments and biological substances, initial encounter: Secondary | ICD-10-CM | POA: Diagnosis not present

## 2021-10-03 DIAGNOSIS — O26891 Other specified pregnancy related conditions, first trimester: Secondary | ICD-10-CM | POA: Diagnosis not present

## 2021-10-03 DIAGNOSIS — Z3A11 11 weeks gestation of pregnancy: Secondary | ICD-10-CM | POA: Diagnosis not present

## 2021-10-03 DIAGNOSIS — K5903 Drug induced constipation: Secondary | ICD-10-CM | POA: Diagnosis not present

## 2021-10-03 MED ORDER — PROMETHAZINE HCL 12.5 MG PO TABS: 12.5000 mg | ORAL_TABLET | Freq: Four times a day (QID) | ORAL | 0 refills | Status: DC | PRN

## 2021-10-03 MED ORDER — POLYETHYLENE GLYCOL 3350 17 G PO PACK: 17.0000 g | PACK | Freq: Every day | ORAL | 0 refills | Status: DC

## 2021-10-03 MED ORDER — DOCUSATE SODIUM 100 MG PO CAPS
100.0000 mg | ORAL_CAPSULE | Freq: Two times a day (BID) | ORAL | 2 refills | Status: DC | PRN
Start: 2021-10-03 — End: 2022-04-06

## 2021-10-03 MED ORDER — PROMETHAZINE HCL 25 MG PO TABS
25.0000 mg | ORAL_TABLET | Freq: Once | ORAL | Status: AC
  Administered 2021-10-03: 25 mg via ORAL
  Filled 2021-10-03: qty 1

## 2021-10-03 NOTE — MAU Note (Signed)
Soap suds enema given. Pt could only tolerate about 250cc ?

## 2021-10-03 NOTE — MAU Note (Signed)
Pt reports good results from remaining soap suds enema and states she feels much better.  ?

## 2021-10-03 NOTE — Discharge Instructions (Signed)

## 2021-10-03 NOTE — MAU Note (Signed)
Pt reports decent results from taking 250cc of enema. Feels like she can tolerate trying more now. Pt was able to tolerate the remainder of soap suds enema. ?

## 2021-10-03 NOTE — MAU Note (Signed)
Cleone Slim CNM in to see pt with bedside u/s. U/S reassuring to pt as normal FHTs noted and baby active. Plan of care discussed ?

## 2021-10-03 NOTE — Progress Notes (Addendum)
Written and verbal d/c instructions given and understanding voiced. Pt requested Phenergan before going home as she wants to avoid Zofran as much as possible due to constipation. Thalia Bloodgood CNM aware and Phenergan ordered before pt's d/c home.  ?

## 2021-10-06 ENCOUNTER — Encounter: Payer: BC Managed Care – PPO | Admitting: Family Medicine

## 2021-10-13 ENCOUNTER — Ambulatory Visit (INDEPENDENT_AMBULATORY_CARE_PROVIDER_SITE_OTHER): Payer: Medicaid Other | Admitting: Women's Health

## 2021-10-13 ENCOUNTER — Other Ambulatory Visit: Payer: Self-pay

## 2021-10-13 ENCOUNTER — Telehealth: Payer: Self-pay

## 2021-10-13 ENCOUNTER — Encounter: Payer: Self-pay | Admitting: Women's Health

## 2021-10-13 VITALS — BP 137/89 | HR 101 | Wt 232.0 lb

## 2021-10-13 DIAGNOSIS — J069 Acute upper respiratory infection, unspecified: Secondary | ICD-10-CM | POA: Diagnosis not present

## 2021-10-13 DIAGNOSIS — Z3A12 12 weeks gestation of pregnancy: Secondary | ICD-10-CM

## 2021-10-13 NOTE — Patient Instructions (Signed)
Safe Medications in Pregnancy  ? ?Colds/Coughs/Allergies: ?Benadryl (alcohol free) 25 mg every 6 hours as needed ?Breath right strips ?Claritin ?Cepacol throat lozenges ?Chloraseptic throat spray ?Cold-Eeze- up to three times per day ?Cough drops, alcohol free ?Flonase (by prescription only) ?Guaifenesin ?Mucinex ?Robitussin DM (plain only, alcohol free) ?Saline nasal spray/drops ?Sudafed (pseudoephedrine) & Actifed ** use only after [redacted] weeks gestation and if you do not have high blood pressure ?Tylenol ?Vicks Vaporub ?Zinc lozenges ?Zyrtec  ? ? ? ?**If taking multiple medications, please check labels to avoid duplicating the same active ingredients ?**take medication as directed on the label ?** Do not exceed 4000 mg of tylenol in 24 hours ?**Do not take medications that contain aspirin or ibuprofen ? ?  ?

## 2021-10-13 NOTE — Progress Notes (Signed)
? ?  Work-in GYN VISIT ?Patient name: Marcia Gonzalez MRN [id number]  Date of birth: October 30, 1995 ?Chief Complaint:   ?Shortness of Breath ? ?History of Present Illness:   ?Marcia Gonzalez is a 26 y.o. G28P0020 Caucasian female at [redacted]w[redacted]d being seen today as work-in for report of productive cough, chest tightness, congestion x 2d. Took some benadryl, but didn't help. No h/o dx, given inhalers when sick sometimes. Also taking otc acid reducers.    ?Patient's last menstrual period was 07/22/2021 (exact date). ? ? ?  09/19/2021  ?  2:39 PM 08/13/2021  ?  3:46 PM 06/30/2021  ?  3:19 PM  ?Depression screen PHQ 2/9  ?Decreased Interest 1 1 1   ?Down, Depressed, Hopeless 1 1 2   ?PHQ - 2 Score 2 2 3   ?Altered sleeping 0 1 2  ?Tired, decreased energy 0 1 2  ?Change in appetite 0 1 1  ?Feeling bad or failure about yourself  0 1 1  ?Trouble concentrating 0 1 0  ?Moving slowly or fidgety/restless 0 1 1  ?Suicidal thoughts 0 0 0  ?PHQ-9 Score 2 8 10   ?Difficult doing work/chores Not difficult at all Somewhat difficult Very difficult  ? ?  ? ?  09/19/2021  ?  2:40 PM 08/13/2021  ?  3:47 PM 06/30/2021  ?  3:23 PM  ?GAD 7 : Generalized Anxiety Score  ?Nervous, Anxious, on Edge 2 1 1   ?Control/stop worrying 3 0 1  ?Worry too much - different things 3 0 1  ?Trouble relaxing 3 1 1   ?Restless 2 0 1  ?Easily annoyed or irritable 3 1 1   ?Afraid - awful might happen 3 1 1   ?Total GAD 7 Score 19 4 7   ?Anxiety Difficulty Extremely difficult Somewhat difficult Not difficult at all  ? ? ? ?Review of Systems:   ?Pertinent items are noted in HPI ?Denies fever/chills, dizziness, headaches, visual disturbances, fatigue, shortness of breath, chest pain, abdominal pain, vomiting, abnormal vaginal discharge/itching/odor/irritation, problems with periods, bowel movements, urination, or intercourse unless otherwise stated above.  ?Pertinent History Reviewed:  ?Reviewed past medical,surgical, social, obstetrical and family history.  ?Reviewed problem list, medications  and allergies. ?Physical Assessment:  ? ?Vitals:  ? 10/13/21 1404  ?BP: 137/89  ?Pulse: (!) 101  ?Weight: 232 lb (105.2 kg)  ?Body mass index is 38.61 kg/m?. ?O2 sat 98% RA ? ?     Physical Examination:  ? General appearance: alert, well appearing, and in no distress ? Mental status: alert, oriented to person, place, and time ? Skin: warm & dry  ? Cardiovascular: normal heart rate noted ? Respiratory: normal respiratory effort, no distress, LCTAB ? Abdomen: soft, non-tender  ? Pelvic: examination not indicated ? Extremities: no edema  ? ?Informal TA u/s: +FCA, active fetus ? ?Chaperone: N/A   ? ?No results found for this or any previous visit (from the past 24 hour(s)).  ?Assessment & Plan:  ?1) [redacted]w[redacted]d pregnant> has new ob visit next week ? ?2) URI> otc relief measures, printed list given ? ?Meds: No orders of the defined types were placed in this encounter. ? ? ?No orders of the defined types were placed in this encounter. ? ? ?Return for As scheduled. ? ?08/15/2021 CNM, WHNP-BC ?10/13/2021 ?2:25 PM  ?

## 2021-10-13 NOTE — Progress Notes (Signed)
O2 sat 98%

## 2021-10-13 NOTE — Telephone Encounter (Signed)
Pt called with c/o cough starting 2 days ago, green mucous today, and chest tightness. Two identifiers used. Pt stated that she used her albuterol inhaler for asthma and it helped her SOB some, but chest tightness still remains and her voice sounded hoarse. Knute Neu advised that she see a provider. Pt's call sent to front to schedule a visit today at 1:50pm. ?

## 2021-10-20 ENCOUNTER — Encounter: Payer: Self-pay | Admitting: Women's Health

## 2021-10-20 ENCOUNTER — Other Ambulatory Visit: Payer: Self-pay | Admitting: Obstetrics & Gynecology

## 2021-10-20 DIAGNOSIS — Z3682 Encounter for antenatal screening for nuchal translucency: Secondary | ICD-10-CM

## 2021-10-21 ENCOUNTER — Encounter: Payer: Self-pay | Admitting: Women's Health

## 2021-10-21 ENCOUNTER — Ambulatory Visit: Payer: Medicaid Other | Admitting: *Deleted

## 2021-10-21 ENCOUNTER — Ambulatory Visit (INDEPENDENT_AMBULATORY_CARE_PROVIDER_SITE_OTHER): Payer: Medicaid Other | Admitting: Women's Health

## 2021-10-21 ENCOUNTER — Ambulatory Visit (INDEPENDENT_AMBULATORY_CARE_PROVIDER_SITE_OTHER): Payer: Medicaid Other

## 2021-10-21 VITALS — BP 127/78 | HR 86 | Wt 232.0 lb

## 2021-10-21 DIAGNOSIS — O0992 Supervision of high risk pregnancy, unspecified, second trimester: Secondary | ICD-10-CM | POA: Insufficient documentation

## 2021-10-21 DIAGNOSIS — Z3682 Encounter for antenatal screening for nuchal translucency: Secondary | ICD-10-CM

## 2021-10-21 DIAGNOSIS — Z3481 Encounter for supervision of other normal pregnancy, first trimester: Secondary | ICD-10-CM

## 2021-10-21 DIAGNOSIS — Z3480 Encounter for supervision of other normal pregnancy, unspecified trimester: Secondary | ICD-10-CM | POA: Diagnosis not present

## 2021-10-21 DIAGNOSIS — F418 Other specified anxiety disorders: Secondary | ICD-10-CM

## 2021-10-21 DIAGNOSIS — Z3A13 13 weeks gestation of pregnancy: Secondary | ICD-10-CM

## 2021-10-21 DIAGNOSIS — F331 Major depressive disorder, recurrent, moderate: Secondary | ICD-10-CM | POA: Insufficient documentation

## 2021-10-21 DIAGNOSIS — Z3490 Encounter for supervision of normal pregnancy, unspecified, unspecified trimester: Secondary | ICD-10-CM | POA: Insufficient documentation

## 2021-10-21 DIAGNOSIS — F330 Major depressive disorder, recurrent, mild: Secondary | ICD-10-CM | POA: Insufficient documentation

## 2021-10-21 DIAGNOSIS — Z3492 Encounter for supervision of normal pregnancy, unspecified, second trimester: Secondary | ICD-10-CM

## 2021-10-21 DIAGNOSIS — Z111 Encounter for screening for respiratory tuberculosis: Secondary | ICD-10-CM

## 2021-10-21 DIAGNOSIS — F332 Major depressive disorder, recurrent severe without psychotic features: Secondary | ICD-10-CM | POA: Insufficient documentation

## 2021-10-21 DIAGNOSIS — E063 Autoimmune thyroiditis: Secondary | ICD-10-CM

## 2021-10-21 DIAGNOSIS — F3341 Major depressive disorder, recurrent, in partial remission: Secondary | ICD-10-CM | POA: Insufficient documentation

## 2021-10-21 DIAGNOSIS — G932 Benign intracranial hypertension: Secondary | ICD-10-CM | POA: Insufficient documentation

## 2021-10-21 DIAGNOSIS — F3342 Major depressive disorder, recurrent, in full remission: Secondary | ICD-10-CM | POA: Insufficient documentation

## 2021-10-21 HISTORY — DX: Supervision of high risk pregnancy, unspecified, second trimester: O09.92

## 2021-10-21 LAB — POCT URINALYSIS DIPSTICK OB
Blood, UA: NEGATIVE
Glucose, UA: NEGATIVE
Ketones, UA: NEGATIVE
Leukocytes, UA: NEGATIVE
Nitrite, UA: NEGATIVE

## 2021-10-21 MED ORDER — BUSPIRONE HCL 5 MG PO TABS: 5.0000 mg | ORAL_TABLET | Freq: Three times a day (TID) | ORAL | 3 refills | Status: DC

## 2021-10-21 MED ORDER — ASPIRIN 81 MG PO TBEC: 81.0000 mg | DELAYED_RELEASE_TABLET | Freq: Every day | ORAL | 3 refills | Status: DC

## 2021-10-21 MED ORDER — BLOOD PRESSURE MONITOR MISC: 0 refills | Status: DC

## 2021-10-21 NOTE — Patient Instructions (Signed)
Naje, thank you for choosing our office today! We appreciate the opportunity to meet your healthcare needs. You may receive a short survey by mail, e-mail, or through MyChart. If you are happy with your care we would appreciate if you could take just a few minutes to complete the survey questions. We read all of your comments and take your feedback very seriously. Thank you again for choosing our office.  ?[facility] ? [facility] ?([address] [place], [place] [address]) ?Entrance C, located off of [address] ?Free 24/7 valet parking  ? Nausea & Vomiting ?Have saltine crackers or pretzels by your bed and eat a few bites before you raise your head out of bed in the morning ?Eat small frequent meals throughout the day instead of large meals ?Drink plenty of fluids throughout the day to stay hydrated, just don't drink a lot of fluids with your meals.  This can make your stomach fill up faster making you feel sick ?Do not brush your teeth right after you eat ?Products with real ginger are good for nausea, like ginger ale and ginger hard candy Make sure it says made with real ginger! ?Sucking on sour candy like lemon heads is also good for nausea ?If your prenatal vitamins make you nauseated, take them at night so you will sleep through the nausea ?Sea Bands ?If you feel like you need medicine for the nausea & vomiting please let us know ?If you are unable to keep any fluids or food down please let us know ? ? Constipation ?Drink plenty of fluid, preferably water, throughout the day ?Eat foods high in fiber such as fruits, vegetables, and grains ?Exercise, such as walking, is a good way to keep your bowels regular ?Drink warm fluids, especially warm prune juice, or decaf coffee ?Eat a 1/2 cup of real oatmeal (not instant), 1/2 cup applesauce, and 1/2-1 cup warm prune juice every day ?If needed, you may take Colace (docusate sodium) stool softener  once or twice a day to help keep the stool soft.  ?If you still are having problems with constipation, you may take Miralax once daily as needed to help keep your bowels regular.  ? ?Home Blood Pressure Monitoring for Patients  ? ?Your provider has recommended that you check your blood pressure (BP) at least once a week at home. If you do not have a blood pressure cuff at home, one will be provided for you. Contact your provider if you have not received your monitor within 1 week.  ? ?Helpful Tips for Accurate Home Blood Pressure Checks  ?Don't smoke, exercise, or drink caffeine 30 minutes before checking your BP ?Use the restroom before checking your BP (a full bladder can raise your pressure) ?Relax in a comfortable upright chair ?Feet on the ground ?Left arm resting comfortably on a flat surface at the level of your heart ?Legs uncrossed ?Back supported ?Sit quietly and don't talk ?Place the cuff on your bare arm ?Adjust snuggly, so that only two fingertips can fit between your skin and the top of the cuff ?Check 2 readings separated by at least one minute ?Keep a log of your BP readings ?For a visual, please reference this diagram: http://ccnc.care/bpdiagram ? ?Provider Name: [facility]     Phone: [phone] ? ?Zone 1: ALL CLEAR  ?Continue to monitor your symptoms:  ?BP reading is less than 140 (top number) or less than 90 (bottom   number)  ?No right upper stomach pain ?No headaches or seeing spots ?No feeling nauseated or throwing up ?No swelling in face and hands ? ?Zone 2: CAUTION ?Call your doctor's office for any of the following:  ?BP reading is greater than 140 (top number) or greater than 90 (bottom number)  ?Stomach pain under your ribs in the middle or right side ?Headaches or seeing spots ?Feeling nauseated or throwing up ?Swelling in face and hands ? ?Zone 3: EMERGENCY  ?Seek immediate medical care if you have any of the following:  ?BP reading is greater than160 (top number) or greater than  110 (bottom number) ?Severe headaches not improving with Tylenol ?Serious difficulty catching your breath ?Any worsening symptoms from Zone 2  ? ? First Trimester of Pregnancy ?The first trimester of pregnancy is from week 1 until the end of week 12 (months 1 through 3). A week after a sperm fertilizes an egg, the egg will implant on the wall of the uterus. This embryo will begin to develop into a baby. Genes from you and your partner are forming the baby. The female genes determine whether the baby is a boy or a girl. At 6-8 weeks, the eyes and face are formed, and the heartbeat can be seen on ultrasound. At the end of 12 weeks, all the baby's organs are formed.  ?Now that you are pregnant, you will want to do everything you can to have a healthy baby. Two of the most important things are to get good prenatal care and to follow your health care provider's instructions. Prenatal care is all the medical care you receive before the baby's birth. This care will help prevent, find, and treat any problems during the pregnancy and childbirth. ?BODY CHANGES ?Your body goes through many changes during pregnancy. The changes vary from woman to woman.  ?You may gain or lose a couple of pounds at first. ?You may feel sick to your stomach (nauseous) and throw up (vomit). If the vomiting is uncontrollable, call your health care provider. ?You may tire easily. ?You may develop headaches that can be relieved by medicines approved by your health care provider. ?You may urinate more often. Painful urination may mean you have a bladder infection. ?You may develop heartburn as a result of your pregnancy. ?You may develop constipation because certain hormones are causing the muscles that push waste through your intestines to slow down. ?You may develop hemorrhoids or swollen, bulging veins (varicose veins). ?Your breasts may begin to grow larger and become tender. Your nipples may stick out more, and the tissue that surrounds them  (areola) may become darker. ?Your gums may bleed and may be sensitive to brushing and flossing. ?Dark spots or blotches (chloasma, mask of pregnancy) may develop on your face. This will likely fade after the baby is born. ?Your menstrual periods will stop. ?You may have a loss of appetite. ?You may develop cravings for certain kinds of food. ?You may have changes in your emotions from day to day, such as being excited to be pregnant or being concerned that something may go wrong with the pregnancy and baby. ?You may have more vivid and strange dreams. ?You may have changes in your hair. These can include thickening of your hair, rapid growth, and changes in texture. Some women also have hair loss during or after pregnancy, or hair that feels dry or thin. Your hair will most likely return to normal after your baby is born. ?WHAT TO EXPECT AT YOUR PRENATAL  VISITS ?During a routine prenatal visit: ?You will be weighed to make sure you and the baby are growing normally. ?Your blood pressure will be taken. ?Your abdomen will be measured to track your baby's growth. ?The fetal heartbeat will be listened to starting around week 10 or 12 of your pregnancy. ?Test results from any previous visits will be discussed. ?Your health care provider may ask you: ?How you are feeling. ?If you are feeling the baby move. ?If you have had any abnormal symptoms, such as leaking fluid, bleeding, severe headaches, or abdominal cramping. ?If you have any questions. ?Other tests that may be performed during your first trimester include: ?Blood tests to find your blood type and to check for the presence of any previous infections. They will also be used to check for low iron levels (anemia) and Rh antibodies. Later in the pregnancy, blood tests for diabetes will be done along with other tests if problems develop. ?Urine tests to check for infections, diabetes, or protein in the urine. ?An ultrasound to confirm the proper growth and development  of the baby. ?An amniocentesis to check for possible genetic problems. ?Fetal screens for spina bifida and Down syndrome. ?You may need other tests to make sure you and the baby are doing well. ?HOME CAR

## 2021-10-21 NOTE — Progress Notes (Signed)
? ? ?INITIAL OBSTETRICAL VISIT ?Patient name: Marcia Gonzalez MRN [id number]  Date of birth: 05-Nov-1995 ?Chief Complaint:   ?Initial Prenatal Visit ? ?History of Present Illness:   ?Marcia Gonzalez is a 26 y.o. G67P0020 Caucasian female at 42w6dby [place] 6 weeks with an Estimated Date of Delivery: 04/22/22 being seen today for her initial obstetrical visit.   ?Patient's last menstrual period was 07/22/2021 (exact date). ?Her obstetrical history is significant for  SAB x 2, was on progesterone suppositories, has run out . Recent URI, still coughing up phlegm, had a little blood in it today.    ?Dep/anx, has been on lexapro 228mfor years, wellbutrin 15027mor months, doesn't feel they're helping, has had a lot of anxiety lately.  ?Hashimotos thyroiditis- on synthroid 112m93mtsh last checked in Jan and was normal ?Needs TB testing for school, doesn't want to do skin test, wants blood work ?Last pap 2022. Results were:  neg per pt at previous provider ? ? ?  10/21/2021  ?  2:55 PM 09/19/2021  ?  2:39 PM 08/13/2021  ?  3:46 PM 06/30/2021  ?  3:19 PM  ?Depression screen PHQ 2/9  ?Decreased Interest '1 1 1 1  ' ?Down, Depressed, Hopeless '1 1 1 2  ' ?PHQ - 2 Score '2 2 2 3  ' ?Altered sleeping 3 0 1 2  ?Tired, decreased energy 3 0 1 2  ?Change in appetite 3 0 1 1  ?Feeling bad or failure about yourself  0 0 1 1  ?Trouble concentrating 0 0 1 0  ?Moving slowly or fidgety/restless 0 0 1 1  ?Suicidal thoughts 0 0 0 0  ?PHQ-9 Score '11 2 8 10  ' ?Difficult doing work/chores  Not difficult at all Somewhat difficult Very difficult  ? ?  ? ?  10/21/2021  ?  3:26 PM 09/19/2021  ?  2:40 PM 08/13/2021  ?  3:47 PM 06/30/2021  ?  3:23 PM  ?GAD 7 : Generalized Anxiety Score  ?Nervous, Anxious, on Edge 0 '2 1 1  ' ?Control/stop worrying 1 3 0 1  ?Worry too much - different things 1 3 0 1  ?Trouble relaxing 0 '3 1 1  ' ?Restless 0 2 0 1  ?Easily annoyed or irritable '3 3 1 1  ' ?Afraid - awful might happen '3 3 1 1  ' ?Total GAD 7 Score '8 19 4 7  ' ?Anxiety Difficulty   Extremely difficult Somewhat difficult Not difficult at all  ? ? ? ?Review of Systems:   ?Pertinent items are noted in HPI ?Denies cramping/contractions, leakage of fluid, vaginal bleeding, abnormal vaginal discharge w/ itching/odor/irritation, headaches, visual changes, shortness of breath, chest pain, abdominal pain, severe nausea/vomiting, or problems with urination or bowel movements unless otherwise stated above.  ?Pertinent History Reviewed:  ?Reviewed past medical,surgical, social, obstetrical and family history.  ?Reviewed problem list, medications and allergies. ?OB History  ?Gravida Para Term Preterm AB Living  ?3 0 0 0 2 0  ?SAB IAB Ectopic Multiple Live Births  ?2 0 0 0 0  ?  ?# Outcome Date GA Lbr Len/2nd Weight Sex Delivery Anes PTL Lv  ?3 Current           ?2 SAB 12/2020          ?1 SAB 08/2020          ? ?Physical Assessment:  ? ?Vitals:  ? 10/21/21 1427  ?BP: 127/78  ?Pulse: 86  ?Weight: 232 lb (105.2 kg)  ?Body mass index is  38.61 kg/m?. ? ?     Physical Examination: ? General appearance - well appearing, and in no distress ? Mental status - alert, oriented to person, place, and time ? Psych:  She has a normal mood and affect ? Skin - warm and dry, normal color, no suspicious lesions noted ? Chest - effort normal, all lung fields clear to auscultation bilaterally ? Heart - normal rate and regular rhythm ? Abdomen - soft, nontender ? Extremities:  No swelling or varicosities noted ? Thin prep pap is not done  ? ?Chaperone: N/A   ? ?TODAY'S NT [place] 13+6 wks,measurements c/w dates,CRL 79.02 mm,FHR 152 bpm,normal ovaries,NB present,NT 2 mm,posterior placenta  ? ?Results for orders placed or performed in visit on 10/21/21 (from the past 24 hour(s))  ?POC Urinalysis Dipstick OB  ? Collection Time: 10/21/21  3:37 PM  ?Result Value Ref Range  ? Color, UA    ? Clarity, UA    ? Glucose, UA Negative Negative  ? Bilirubin, UA    ? Ketones, UA neg   ? Spec Grav, UA    ? Blood, UA neg   ? pH, UA    ?  POC,PROTEIN,UA Trace Negative, Trace, Small (1+), Moderate (2+), Large (3+), 4+  ? Urobilinogen, UA    ? Nitrite, UA neg   ? Leukocytes, UA Negative Negative  ? Appearance    ? Odor    ?  ?Assessment & Plan:  ?1) Low-Risk Pregnancy G3P0020 at 46w6dwith an Estimated Date of Delivery: 04/22/22  ? ?2) Initial OB visit ? ?3) Prev SAB x 2> was on progesterone suppositories, ran out. Discussed we usually stop at 14wks anway, no need to continue ? ?4) Previous SOlmitzon u/s>at 11wks, not seen today ? ?5) Dep/anx> on lexapro 239mand wellbutrin 15090mdoesn't feel they're working well-has increased anxiety, rx buspar, [facility] referral ordered ? ?6) Hashimoto's thyroiditis> on synthroid 112m63mTSH wnl in Jan, will repeat next visit ? ?7) TB screening for school> doesn't want skin test, wants bloodwork, discussed w/ [facility]- order Quantiferon ? ?Meds:  ?Meds ordered this encounter  ?Medications  ? Blood Pressure Monitor MISC  ?  Sig: For regular home bp monitoring during pregnancy  ?  Dispense:  1 each  ?  Refill:  0  ?  Z34.81 ?Please mail to patient  ? aspirin 81 MG EC tablet  ?  Sig: Take 1 tablet (81 mg total) by mouth daily. Swallow whole.  ?  Dispense:  90 tablet  ?  Refill:  3  ?  Order Specific Question:   Supervising Provider  ?  Answer:   EURETania [id number]]  ? busPIRone (BUSPAR) 5 MG tablet  ?  Sig: Take 1 tablet (5 mg total) by mouth 3 (three) times daily.  ?  Dispense:  90 tablet  ?  Refill:  3  ?  Order Specific Question:   Supervising Provider  ?  Answer:   EURETania [id number]]  ? ? 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 ?Does not have home bp cuff. Office bp cuff given: no. Rx sent: yes. Check bp weekly, let us know if consistently >140/90.  ? ?Indications for ASA therapy (per uptodate) ?OR Two or more of the following: ?Nulliparity Yes ?Obesity (BMI>30 kg/m2) Yes ? ?Follow-up: Return in about 3 weeks (around 11/11/2021) for LROB, 2nd IT, TSH, CNM, in person; then 6wks from now for [facility] w/ anatomy u/s .  ? ?Orders Placed This Encounter  ?Procedures  ? Urine Culture  ? GC/Chlamydia Probe Amp  ? Integrated 1  ? Genetic Screening  ? CBC/D/Plt+RPR+Rh+ABO+RubIgG...  ? QuantiFERON-TB Gold Plus  ? Amb ref to [facility]  ? POC Urinalysis Dipstick OB  ? ? ?Roma Schanz CNM, WHNP-BC ?10/21/2021 ?4:16 PM  ?

## 2021-10-21 NOTE — Addendum Note (Signed)
Addended by: Cheral Marker on: 10/21/2021 04:39 PM ? ? Modules accepted: Orders ? ?

## 2021-10-21 NOTE — Progress Notes (Signed)
[place] 13+6 wks,measurements c/w dates,CRL 79.02 mm,FHR 152 bpm,normal ovaries,NB present,NT 2 mm,posterior placenta  ?

## 2021-10-22 LAB — GC/CHLAMYDIA PROBE AMP
Chlamydia trachomatis, NAA: NEGATIVE
Neisseria Gonorrhoeae by PCR: NEGATIVE

## 2021-10-23 ENCOUNTER — Encounter: Payer: Self-pay | Admitting: Women's Health

## 2021-10-23 LAB — HCV INTERPRETATION

## 2021-10-23 LAB — CBC/D/PLT+RPR+RH+ABO+RUBIGG...
Antibody Screen: NEGATIVE
Basophils Absolute: 0 10*3/uL (ref 0.0–0.2)
Basos: 1 %
EOS (ABSOLUTE): 0.1 10*3/uL (ref 0.0–0.4)
Eos: 1 %
HCV Ab: NONREACTIVE
HIV Screen 4th Generation wRfx: NONREACTIVE
Hematocrit: 39.3 % (ref 34.0–46.6)
Hemoglobin: 13 g/dL (ref 11.1–15.9)
Hepatitis B Surface Ag: NEGATIVE
Immature Grans (Abs): 0 10*3/uL (ref 0.0–0.1)
Immature Granulocytes: 1 %
Lymphocytes Absolute: 1.9 10*3/uL (ref 0.7–3.1)
Lymphs: 24 %
MCH: 28.9 pg (ref 26.6–33.0)
MCHC: 33.1 g/dL (ref 31.5–35.7)
MCV: 87 fL (ref 79–97)
Monocytes Absolute: 0.5 10*3/uL (ref 0.1–0.9)
Monocytes: 6 %
Neutrophils Absolute: 5.3 10*3/uL (ref 1.4–7.0)
Neutrophils: 67 %
Platelets: 354 10*3/uL (ref 150–450)
RBC: 4.5 x10E6/uL (ref 3.77–5.28)
RDW: 12.7 % (ref 11.7–15.4)
RPR Ser Ql: NONREACTIVE
Rh Factor: POSITIVE
Rubella Antibodies, IGG: 1.8 index (ref >0.99)
WBC: 7.9 10*3/uL (ref 3.4–10.8)

## 2021-10-23 LAB — INTEGRATED 1
Crown Rump Length: 79 mm
Gest. Age on Collection Date: 13.6 weeks
Maternal Age at EDD: 24 yr
Nuchal Translucency (NT): 2 mm
Number of Fetuses: 1
PAPP-A Value: 1890.9 ng/mL
Weight: 232 [lb_av]

## 2021-10-23 LAB — URINE CULTURE

## 2021-10-24 LAB — QUANTIFERON-TB GOLD PLUS
QuantiFERON Mitogen Value: >10 [IU]/mL
QuantiFERON Nil Value: 0.06 [IU]/mL
QuantiFERON TB1 Ag Value: 0.07 [IU]/mL
QuantiFERON TB2 Ag Value: 0.1 [IU]/mL
QuantiFERON-TB Gold Plus: NEGATIVE

## 2021-11-04 ENCOUNTER — Encounter: Payer: Self-pay | Admitting: Women's Health

## 2021-11-07 NOTE — BH Specialist Note (Signed)
Pt did not arrive to video visit and did not answer the phone; Left HIPPA-compliant message to call back Tihanna Goodson from [facility] at  [phone] (Evella Kasal's office).  ?; left MyChart message for patient.  ? ?

## 2021-11-08 ENCOUNTER — Encounter: Payer: Self-pay | Admitting: Women's Health

## 2021-11-11 ENCOUNTER — Encounter: Payer: Self-pay | Admitting: Obstetrics & Gynecology

## 2021-11-11 ENCOUNTER — Encounter: Payer: Medicaid Other | Admitting: Women's Health

## 2021-11-11 ENCOUNTER — Ambulatory Visit (INDEPENDENT_AMBULATORY_CARE_PROVIDER_SITE_OTHER): Payer: Medicaid Other | Admitting: Obstetrics & Gynecology

## 2021-11-11 VITALS — BP 132/86 | HR 73 | Wt 232.0 lb

## 2021-11-11 DIAGNOSIS — E039 Hypothyroidism, unspecified: Secondary | ICD-10-CM

## 2021-11-11 DIAGNOSIS — O99281 Endocrine, nutritional and metabolic diseases complicating pregnancy, first trimester: Secondary | ICD-10-CM

## 2021-11-11 DIAGNOSIS — Z3480 Encounter for supervision of other normal pregnancy, unspecified trimester: Secondary | ICD-10-CM

## 2021-11-11 DIAGNOSIS — Z1379 Encounter for other screening for genetic and chromosomal anomalies: Secondary | ICD-10-CM

## 2021-11-11 NOTE — Progress Notes (Signed)
? ?  LOW-RISK PREGNANCY VISIT ?Patient name: Marcia Gonzalez MRN [id number]  Date of birth: Dec 04, 1995 ?Chief Complaint:   ?Routine Prenatal Visit (2nd IT, TSh and redraw panorama) ? ?History of Present Illness:   ?Marcia Gonzalez is a 26 y.o. G52P0020 female at [redacted]w[redacted]d with an Estimated Date of Delivery: 04/22/22 being seen today for ongoing management of a low-risk pregnancy.  ? ?  10/21/2021  ?  2:55 PM 09/19/2021  ?  2:39 PM 08/13/2021  ?  3:46 PM 06/30/2021  ?  3:19 PM  ?Depression screen PHQ 2/9  ?Decreased Interest 1 1 1 1   ?Down, Depressed, Hopeless 1 1 1 2   ?PHQ - 2 Score 2 2 2 3   ?Altered sleeping 3 0 1 2  ?Tired, decreased energy 3 0 1 2  ?Change in appetite 3 0 1 1  ?Feeling bad or failure about yourself  0 0 1 1  ?Trouble concentrating 0 0 1 0  ?Moving slowly or fidgety/restless 0 0 1 1  ?Suicidal thoughts 0 0 0 0  ?PHQ-9 Score 11 2 8 10   ?Difficult doing work/chores  Not difficult at all Somewhat difficult Very difficult  ? ? ?Today she reports no complaints. Contractions: Not present. Vag. Bleeding: None.   . denies leaking of fluid. ?Review of Systems:   ?Pertinent items are noted in HPI ?Denies abnormal vaginal discharge w/ itching/odor/irritation, headaches, visual changes, shortness of breath, chest pain, abdominal pain, severe nausea/vomiting, or problems with urination or bowel movements unless otherwise stated above. ?Pertinent History Reviewed:  ?Reviewed past medical,surgical, social, obstetrical and family history.  ?Reviewed problem list, medications and allergies. ?Physical Assessment:  ? ?Vitals:  ? 11/11/21 1556  ?BP: 132/86  ?Pulse: 73  ?Weight: 232 lb (105.2 kg)  ?Body mass index is 38.61 kg/m?. ?  ?     Physical Examination:  ? General appearance: Well appearing, and in no distress ? Mental status: Alert, oriented to person, place, and time ? Skin: Warm & dry ? Cardiovascular: Normal heart rate noted ? Respiratory: Normal respiratory effort, no distress ? Abdomen: Soft, gravid, nontender ? Pelvic:  Cervical exam deferred        ? Extremities: Edema: None ? ?Fetal Status:         ? ?Chaperone: n/a   ? ?No results found for this or any previous visit (from the past 24 hour(s)).  ?Assessment & Plan:  ?1) Low-risk pregnancy G3P0020 at [redacted]w[redacted]d with an Estimated Date of Delivery: 04/22/22  ? ? ?  ?Meds: No orders of the defined types were placed in this encounter. ? ?Labs/procedures today: 2nd IT ? ?Plan:  Continue routine obstetrical care  ?Next visit: prefers in person   ? ? ? ?Follow-up: Return in about 3 weeks (around 12/02/2021) for 20 week sono, LROB. ? ?Orders Placed This Encounter  ?Procedures  ? INTEGRATED 2  ? TSH  ? ? ?Florian Buff, MD ?11/11/2021 ?4:35 PM ? ?

## 2021-11-12 ENCOUNTER — Ambulatory Visit: Payer: BC Managed Care – PPO | Admitting: Family Medicine

## 2021-11-13 LAB — INTEGRATED 2
AFP MoM: 1.08
Alpha-Fetoprotein: 26.6 ng/mL
Crown Rump Length: 79 mm
DIA MoM: 1.05
DIA Value: 124.6 pg/mL
Estriol, Unconjugated: 1.01 ng/mL
Gest. Age on Collection Date: 13.6 weeks
Gestational Age: 16.6 weeks
Maternal Age at EDD: 24 yr
Nuchal Translucency (NT): 2 mm
Nuchal Translucency MoM: 1.08
Number of Fetuses: 1
PAPP-A MoM: 2.21
PAPP-A Value: 1890.9 ng/mL
Test Results:: NEGATIVE
Weight: 232 [lb_av]
Weight: 232 [lb_av]
hCG MoM: 0.91
hCG Value: 21.8 IU/mL
uE3 MoM: 1.11

## 2021-11-13 LAB — TSH: TSH: 1.38 u[IU]/mL (ref 0.450–4.500)

## 2021-11-14 ENCOUNTER — Telehealth: Payer: Self-pay | Admitting: Family Medicine

## 2021-11-14 NOTE — Telephone Encounter (Signed)
Patient called requesting copy of her immunization record. Didn't see one on file for her but she says we should have it. ? ?Please advise and call patient. ?She wants to come pick up today. ?

## 2021-11-14 NOTE — Telephone Encounter (Signed)
Disregard telephone message. Immunzation records were found and printed for pt to pick up. ?

## 2021-11-19 ENCOUNTER — Ambulatory Visit: Payer: Medicaid Other | Admitting: Clinical

## 2021-11-19 DIAGNOSIS — Z91199 Patient's noncompliance with other medical treatment and regimen due to unspecified reason: Secondary | ICD-10-CM

## 2021-11-20 ENCOUNTER — Encounter: Payer: Self-pay | Admitting: Women's Health

## 2021-11-20 ENCOUNTER — Telehealth: Payer: Self-pay

## 2021-11-20 MED ORDER — AZITHROMYCIN 250 MG PO TABS: ORAL_TABLET | ORAL | 0 refills | Status: DC

## 2021-11-20 NOTE — Telephone Encounter (Signed)
S 

## 2021-11-26 ENCOUNTER — Ambulatory Visit: Payer: Medicaid Other

## 2021-11-26 ENCOUNTER — Other Ambulatory Visit: Payer: Medicaid Other

## 2021-11-27 ENCOUNTER — Telehealth: Payer: Self-pay | Admitting: *Deleted

## 2021-11-27 NOTE — Telephone Encounter (Signed)
Pt called with pain in belly and low back. Feels like a stabbing pain in belly button, today it's like a menstral cramp. Tylenol not helping. Orange/yellow discharge. No irritation or itching. I spoke with Dr. Charlotta Newton. Pain at belly button can be stretching pain. Use ice or heat. Pt was at a field trip with her child and may have over done it. Continue Tylenol and take it easy. Keep scheduled appt. Pt voiced understanding. JSY ?

## 2021-11-28 ENCOUNTER — Other Ambulatory Visit: Payer: Self-pay | Admitting: Physician Assistant

## 2021-11-28 ENCOUNTER — Other Ambulatory Visit: Payer: Self-pay | Admitting: Obstetrics & Gynecology

## 2021-11-28 DIAGNOSIS — Z363 Encounter for antenatal screening for malformations: Secondary | ICD-10-CM

## 2021-12-02 ENCOUNTER — Encounter: Payer: Medicaid Other | Admitting: Women's Health

## 2021-12-02 ENCOUNTER — Other Ambulatory Visit: Payer: Medicaid Other

## 2021-12-03 ENCOUNTER — Ambulatory Visit (INDEPENDENT_AMBULATORY_CARE_PROVIDER_SITE_OTHER): Payer: Medicaid Other | Admitting: Advanced Practice Midwife

## 2021-12-03 ENCOUNTER — Other Ambulatory Visit (HOSPITAL_COMMUNITY)
Admission: RE | Admit: 2021-12-03 | Discharge: 2021-12-03 | Disposition: A | Discharge status: Home / Self Care | Payer: Medicaid Other | Source: Ambulatory Visit | Attending: Women's Health | Admitting: Women's Health

## 2021-12-03 ENCOUNTER — Encounter: Payer: Self-pay | Admitting: Obstetrics & Gynecology

## 2021-12-03 ENCOUNTER — Ambulatory Visit (INDEPENDENT_AMBULATORY_CARE_PROVIDER_SITE_OTHER): Payer: BC Managed Care – PPO

## 2021-12-03 VITALS — BP 129/84 | HR 85 | Wt 234.0 lb

## 2021-12-03 DIAGNOSIS — N898 Other specified noninflammatory disorders of vagina: Secondary | ICD-10-CM | POA: Diagnosis present

## 2021-12-03 DIAGNOSIS — Z3480 Encounter for supervision of other normal pregnancy, unspecified trimester: Secondary | ICD-10-CM

## 2021-12-03 DIAGNOSIS — E063 Autoimmune thyroiditis: Secondary | ICD-10-CM

## 2021-12-03 DIAGNOSIS — Z3A20 20 weeks gestation of pregnancy: Secondary | ICD-10-CM

## 2021-12-03 DIAGNOSIS — Z363 Encounter for antenatal screening for malformations: Secondary | ICD-10-CM | POA: Diagnosis not present

## 2021-12-03 DIAGNOSIS — F418 Other specified anxiety disorders: Secondary | ICD-10-CM

## 2021-12-03 DIAGNOSIS — O10919 Unspecified pre-existing hypertension complicating pregnancy, unspecified trimester: Secondary | ICD-10-CM

## 2021-12-03 DIAGNOSIS — O0992 Supervision of high risk pregnancy, unspecified, second trimester: Secondary | ICD-10-CM

## 2021-12-03 HISTORY — DX: Unspecified pre-existing hypertension complicating pregnancy, unspecified trimester: O10.919

## 2021-12-03 LAB — POCT URINALYSIS DIPSTICK OB
Blood, UA: NEGATIVE
Glucose, UA: NEGATIVE
Ketones, UA: NEGATIVE
Leukocytes, UA: NEGATIVE
Nitrite, UA: NEGATIVE
POC,PROTEIN,UA: NEGATIVE

## 2021-12-03 MED ORDER — HYDROCORTISONE ACE-PRAMOXINE 1-1 % EX CREA: 1.0000 "application " | TOPICAL_CREAM | Freq: Two times a day (BID) | CUTANEOUS | 3 refills | Status: DC

## 2021-12-03 MED ORDER — LABETALOL HCL 200 MG PO TABS: 200.0000 mg | ORAL_TABLET | Freq: Two times a day (BID) | ORAL | 3 refills | Status: DC

## 2021-12-03 NOTE — Patient Instructions (Signed)
Keryl, thank you for choosing our office today! We appreciate the opportunity to meet your healthcare needs. You may receive a short survey by mail, e-mail, or through [employer]. If you are happy with your care we would appreciate if you could take just a few minutes to complete the survey questions. We read all of your comments and take your feedback very seriously. Thank you again for choosing our office.  ?Center for [employer] Team at [facility] ?[facility] at [facility] ?([address] [place], [place] [address]) ?Entrance C, located off of [address] ?Free 24/7 valet parking  ?Go to [email or web address] to register for FREE online childbirth classes ? ?Call the office [phone]) or go to [facility] if: ?You begin to severe cramping ?Your water breaks.  Sometimes it is a big gush of fluid, sometimes it is just a trickle that keeps getting your panties wet or running down your legs ?You have vaginal bleeding.  It is normal to have a small amount of spotting if your cervix was checked.  ? ?[place] Pediatricians/Family Doctors ?[facility]): [address], [phone]           ?[facility]: [address], [phone]                ?[facility]): [address], [phone] (call to ask if accepting patients) ?[facility] Department: [address], [place], [phone]   ? ?[place] Pediatricians/Family Doctors ?[facility]): [address], [phone] ?[facility]: [address] [place], [phone] ?[facility]: [address], [phone] ? ?[employer] Family Doctors  ?[facility]): [phone] ?[facility]: [address], [phone]  ? ?[facility] Family Doctors ?[facility]: [address] [phone] ? ?[facility]  ?[facility]: [phone], [phone] ? ?Home Blood Pressure Monitoring for Patients  ? ?Your provider has recommended that you check your blood pressure (BP) at least once a week at home. If you do not have a blood pressure cuff at home, one will be provided for you. Contact your provider if you have not received your monitor within 1 week.  ? ?Helpful Tips for Accurate Home Blood Pressure Checks  ?Don't smoke, exercise, or drink caffeine 30 minutes before checking your BP ?Use the restroom before checking your BP (a full bladder can raise your pressure) ?Relax in a comfortable upright chair ?Feet on the ground ?Left arm resting comfortably on a flat surface at the level of your heart ?Legs uncrossed ?Back supported ?Sit quietly and don't talk ?Place the cuff on your bare arm ?Adjust snuggly, so that only two fingertips can fit between your skin and the top of the cuff ?Check 2 readings separated by at least one minute ?Keep a log of your BP readings ?For a visual, please reference this diagram: http://ccnc.care/bpdiagram ? ?Provider Name: [facility]     Phone: [phone] ? ?Zone 1: ALL CLEAR  ?Continue to monitor your symptoms:  ?BP reading is less than 140 (top number) or less than 90 (bottom number)  ?No right upper stomach pain ?No headaches or seeing spots ?No feeling nauseated or throwing up ?No swelling in face and hands ? ?Zone 2: CAUTION ?Call your doctor's office for any of the following:  ?BP reading is greater than 140 (top number) or greater than  90 (bottom number)  ?Stomach pain under your ribs in the middle or right side ?Headaches or seeing spots ?Feeling nauseated or throwing up ?Swelling in face and hands ? ?Zone 3: EMERGENCY  ?Seek immediate medical care if you have any of the following:  ?BP reading is greater than160 (top number) or greater than 110 (bottom number) ?Severe headaches not improving with Tylenol ?Serious difficulty catching your breath ?Any worsening symptoms from  Zone 2  ? ?  ?Second Trimester of Pregnancy ?The second trimester is from week 14 through week 27 (months 4 through 6). The second trimester is often a time when you feel your best. Your body has adjusted to being pregnant, and you begin to feel better physically. Usually, morning sickness has lessened or quit completely, you may have more energy, and you may have an increase in appetite. The second trimester is also a time when the fetus is growing rapidly. At the end of the sixth month, the fetus is about 9 inches long and weighs about 1? pounds. You will likely begin to feel the baby move (quickening) between 16 and 20 weeks of pregnancy. ?Body changes during your second trimester ?Your body continues to go through many changes during your second trimester. The changes vary from woman to woman. ?Your weight will continue to increase. You will notice your lower abdomen bulging out. ?You may begin to get stretch marks on your hips, abdomen, and breasts. ?You may develop headaches that can be relieved by medicines. The medicines should be approved by your health care provider. ?You may urinate more often because the fetus is pressing on your bladder. ?You may develop or continue to have heartburn as a result of your pregnancy. ?You may develop constipation because certain hormones are causing the muscles that push waste through your intestines to slow down. ?You may develop hemorrhoids or swollen, bulging veins (varicose veins). ?You may have back pain. This is caused by: ?Weight gain. ?Pregnancy hormones that are relaxing the joints in your pelvis. ?A shift in weight and the muscles that support your balance. ?Your breasts will continue to grow and they will continue to become tender. ?Your gums may bleed and may be sensitive to brushing and flossing. ?Dark spots or blotches (chloasma, mask of pregnancy) may develop on your face. This will likely fade after the baby is born. ?A dark line from your belly button to  the pubic area (linea nigra) may appear. This will likely fade after the baby is born. ?You may have changes in your hair. These can include thickening of your hair, rapid growth, and changes in texture. Some women also have hair loss during or after pregnancy, or hair that feels dry or thin. Your hair will most likely return to normal after your baby is born. ? ?What to expect at prenatal visits ?During a routine prenatal visit: ?You will be weighed to make sure you and the fetus are growing normally. ?Your blood pressure will be taken. ?Your abdomen will be measured to track your baby's growth. ?The fetal heartbeat will be listened to. ?Any test results from the previous visit will be discussed. ? ?Your health care provider may ask you: ?How you are feeling. ?If you are feeling the baby move. ?If you have had any abnormal symptoms, such as leaking fluid, bleeding, severe headaches, or abdominal cramping. ?If you are using any tobacco products, including cigarettes, chewing tobacco, and electronic cigarettes. ?If you have any questions. ? ?Other tests that may be performed during  your second trimester include: ?Blood tests that check for: ?Low iron levels (anemia). ?High blood sugar that affects pregnant women (gestational diabetes) between 59 and 28 weeks. ?Rh antibodies. This is to check for a protein on red blood cells (Rh factor). ?Urine tests to check for infections, diabetes, or protein in the urine. ?An ultrasound to confirm the proper growth and development of the baby. ?An amniocentesis to check for possible genetic problems. ?Fetal screens for spina bifida and Down syndrome. ?HIV (human immunodeficiency virus) testing. Routine prenatal testing includes screening for HIV, unless you choose not to have this test. ? ?Follow these instructions at home: ?Medicines ?Follow your health care provider's instructions regarding medicine use. Specific medicines may be either safe or unsafe to take during  pregnancy. ?Take a prenatal vitamin that contains at least 600 micrograms (mcg) of folic acid. ?If you develop constipation, try taking a stool softener if your health care provider approves. ?Eating and drinking ?Eat

## 2021-12-03 NOTE — Progress Notes (Signed)
? ?HIGH-RISK PREGNANCY VISIT ?Patient name: Marcia Gonzalez MRN [id number]  Date of birth: August 12, 1995 ?Chief Complaint:   ?Routine Prenatal Visit ? ?History of Present Illness:   ?Marcia Gonzalez is a 26 y.o. 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  ? ?Today she reports  constipation/bleeding hemorrhoids; green vag d/c; low abd cramping; feels like Wellbutrin isn't helping with depression- has appt with Roselyn Reef soon . Contractions: Not present. Vag. Bleeding: None.  Movement: Present. denies leaking of fluid.  ? ? ?  10/21/2021  ?  2:55 PM 09/19/2021  ?  2:39 PM 08/13/2021  ?  3:46 PM 06/30/2021  ?  3:19 PM  ?Depression screen PHQ 2/9  ?Decreased Interest 1 1 1 1   ?Down, Depressed, Hopeless 1 1 1 2   ?PHQ - 2 Score 2 2 2 3   ?Altered sleeping 3 0 1 2  ?Tired, decreased energy 3 0 1 2  ?Change in appetite 3 0 1 1  ?Feeling bad or failure about yourself  0 0 1 1  ?Trouble concentrating 0 0 1 0  ?Moving slowly or fidgety/restless 0 0 1 1  ?Suicidal thoughts 0 0 0 0  ?PHQ-9 Score 11 2 8 10   ?Difficult doing work/chores  Not difficult at all Somewhat difficult Very difficult  ? ?  ? ?  10/21/2021  ?  3:26 PM 09/19/2021  ?  2:40 PM 08/13/2021  ?  3:47 PM 06/30/2021  ?  3:23 PM  ?GAD 7 : Generalized Anxiety Score  ?Nervous, Anxious, on Edge 0 2 1 1   ?Control/stop worrying 1 3 0 1  ?Worry too much - different things 1 3 0 1  ?Trouble relaxing 0 3 1 1   ?Restless 0 2 0 1  ?Easily annoyed or irritable 3 3 1 1   ?Afraid - awful might happen 3 3 1 1   ?Total GAD 7 Score 8 19 4 7   ?Anxiety Difficulty  Extremely difficult Somewhat difficult Not difficult at all  ? ? ? ?Review of Systems:   ?Pertinent items are noted in HPI ?Denies abnormal vaginal discharge w/ itching/odor/irritation, headaches,  visual changes, shortness of breath, chest pain, abdominal pain, severe nausea/vomiting, or problems with urination or bowel movements unless otherwise stated above. ?Pertinent History Reviewed:  ?Reviewed past medical,surgical, social, obstetrical and family history.  ?Reviewed problem list, medications and allergies. ?Physical Assessment:  ? ?Vitals:  ? 12/03/21 1556 12/03/21 1601  ?BP: (!) 148/84 129/84  ?Pulse: 90 85  ?Weight: 234 lb (106.1 kg)   ?Body mass index is 38.94 kg/m?. ?     ?     Physical Examination:  ? General appearance: alert, well appearing, and in no distress ? Mental status: alert, oriented to person, place, and time ? Skin: warm & dry  ? Extremities: Edema: None  ?  Cardiovascular: normal heart rate noted ? Respiratory: normal respiratory effort, no distress ? Abdomen: gravid, soft, non-tender ? Pelvic: Cervical exam deferred        ? ?Fetal Status: Fetal Heart Rate (bpm): 146 u/s   Movement: Present   ? ?Fetal Surveillance Testing today (anatomy u/s): [place] 20 wks,breech,posterior fundal placenta gr 0,CX 3.3 cm,SVP of fluid 5.3 cm,FHR 146 bpm,normal ovaries,EFW 352 g 69%,anatomy complete,no obvious abnormalities  ? ? ?Results for orders placed  or performed in visit on 12/03/21 (from the past 24 hour(s))  ?POC Urinalysis Dipstick OB  ? Collection Time: 12/03/21  4:02 PM  ?Result Value Ref Range  ? Color, UA    ? Clarity, UA    ? Glucose, UA Negative Negative  ? Bilirubin, UA    ? Ketones, UA neg   ? Spec Grav, UA    ? Blood, UA neg   ? pH, UA    ? POC,PROTEIN,UA Negative Negative, Trace, Small (1+), Moderate (2+), Large (3+), 4+  ? Urobilinogen, UA    ? Nitrite, UA neg   ? Leukocytes, UA Negative Negative  ? Appearance    ? Odor    ?  ?Assessment & Plan:  ?High-risk pregnancy: G3P0020 at [redacted]w[redacted]d with an Estimated Date of Delivery: 04/22/22  ? ?1) cHTN, diagnosed today by BP elevation in conjunction with previous elevations at work (EMT); Labetalol 200mg  bid ? ?2) Constipation/hemorrhoids, rx  Proctocream and reviewed stool softener, Miralax and other measures to decrease constipation ? ?3) Vag d/c, CV swab sent ? ?4) Depression, isn't improving with the addition of Wellbutrin, taking Lexapro already; msg to Tish to assist with referral to psychiatrist for additional help with med adjustments; has appt with Roselyn Reef soon ? ?5) Still waiting on Pap records, sent ROI 4/4 and 5/11; Daisy to call soon to see if she had Pap at [facility] in 2022 ? ?Meds:  ?Meds ordered this encounter  ?Medications  ? labetalol (NORMODYNE) 200 MG tablet  ?  Sig: Take 1 tablet (200 mg total) by mouth 2 (two) times daily.  ?  Dispense:  60 tablet  ?  Refill:  3  ?  Order Specific Question:   Supervising Provider  ?  AnswerJanyth Pupa [id number]  ? pramoxine-hydrocortisone (PROCTOCREAM-HC) 1-1 % rectal cream  ?  Sig: Place 1 application. rectally 2 (two) times daily.  ?  Dispense:  30 g  ?  Refill:  3  ?  Order Specific Question:   Supervising Provider  ?  AnswerJanyth Pupa [id number]  ? ? ?Labs/procedures today: U/S ? ?Treatment Plan:  growth q 4wks; 2x/wk testing @ 32wks and IOL 37-39wks ? ?Reviewed: Preterm labor symptoms and general obstetric precautions including but not limited to vaginal bleeding, contractions, leaking of fluid and fetal movement were reviewed in detail with the patient.  All questions were answered. Does not have home bp cuff. Office bp cuff given: no. Check bp daily, let us know if consistently >140 and/or >90. ? ?Follow-up: Return in about 4 weeks (around 12/31/2021) for HROB, in person. ? ? ?Future Appointments  ?Date Time Provider [place]  ?12/31/2021  3:50 PM Janyth Pupa, DO [facility]  ? ? ?Orders Placed This Encounter  ?Procedures  ? POC Urinalysis Dipstick OB  ? ?Myrtis Ser CNM ?12/03/2021 ?5:18 PM  ?

## 2021-12-03 NOTE — Progress Notes (Signed)
[place] 20 wks,breech,posterior fundal placenta gr 0,CX 3.3 cm,SVP of fluid 5.3 cm,FHR 146 bpm,normal ovaries,EFW 352 g 69%,limited view of face,please have pt come back today for additional images,no obvious abnormalities  ?

## 2021-12-03 NOTE — Progress Notes (Signed)
Reports headaches, blurry vision and dizziness. Elevated BP at home.  ?

## 2021-12-05 LAB — CERVICOVAGINAL ANCILLARY ONLY
Bacterial Vaginitis (gardnerella): NEGATIVE
Candida Glabrata: NEGATIVE
Candida Vaginitis: NEGATIVE
Chlamydia: NEGATIVE
Comment: NEGATIVE
Comment: NEGATIVE
Comment: NEGATIVE
Comment: NEGATIVE
Comment: NEGATIVE
Comment: NORMAL
Neisseria Gonorrhea: NEGATIVE
Trichomonas: NEGATIVE

## 2021-12-17 ENCOUNTER — Encounter: Payer: Self-pay | Admitting: *Deleted

## 2021-12-23 ENCOUNTER — Encounter (HOSPITAL_COMMUNITY): Payer: Self-pay | Admitting: Obstetrics and Gynecology

## 2021-12-23 ENCOUNTER — Other Ambulatory Visit: Payer: Self-pay

## 2021-12-23 ENCOUNTER — Inpatient Hospital Stay (HOSPITAL_COMMUNITY)
Admission: AD | Admit: 2021-12-23 | Discharge: 2021-12-23 | Disposition: A | Discharge status: Home / Self Care | Payer: BC Managed Care – PPO | Attending: Obstetrics and Gynecology | Admitting: Obstetrics and Gynecology

## 2021-12-23 DIAGNOSIS — O26892 Other specified pregnancy related conditions, second trimester: Secondary | ICD-10-CM | POA: Diagnosis not present

## 2021-12-23 DIAGNOSIS — O99891 Other specified diseases and conditions complicating pregnancy: Secondary | ICD-10-CM | POA: Diagnosis not present

## 2021-12-23 DIAGNOSIS — R519 Headache, unspecified: Secondary | ICD-10-CM | POA: Diagnosis not present

## 2021-12-23 DIAGNOSIS — Z3A22 22 weeks gestation of pregnancy: Secondary | ICD-10-CM | POA: Diagnosis not present

## 2021-12-23 LAB — URINALYSIS, ROUTINE W REFLEX MICROSCOPIC
Bilirubin Urine: NEGATIVE
Glucose, UA: NEGATIVE mg/dL
Hgb urine dipstick: NEGATIVE
Ketones, ur: 5 mg/dL — AB
Nitrite: NEGATIVE
Protein, ur: 30 mg/dL — AB
Specific Gravity, Urine: 1.024 (ref 1.005–1.030)
pH: 7 (ref 5.0–8.0)

## 2021-12-23 MED ORDER — ACETAMINOPHEN-CAFFEINE 500-65 MG PO TABS: 2.0000 | ORAL_TABLET | Freq: Once | ORAL | Status: DC

## 2021-12-23 MED ORDER — METOCLOPRAMIDE HCL 5 MG/ML IJ SOLN
10.0000 mg | Freq: Once | INTRAMUSCULAR | Status: AC
  Administered 2021-12-23: 10 mg via INTRAVENOUS
  Filled 2021-12-23: qty 2

## 2021-12-23 MED ORDER — PROCHLORPERAZINE EDISYLATE 10 MG/2ML IJ SOLN: 10.0000 mg | Freq: Once | INTRAMUSCULAR | Status: DC

## 2021-12-23 MED ORDER — LACTATED RINGERS IV BOLUS
1000.0000 mL | Freq: Once | INTRAVENOUS | Status: AC
  Administered 2021-12-23: 1000 mL via INTRAVENOUS

## 2021-12-23 MED ORDER — DIPHENHYDRAMINE HCL 50 MG/ML IJ SOLN
25.0000 mg | Freq: Once | INTRAMUSCULAR | Status: AC
  Administered 2021-12-23: 25 mg via INTRAVENOUS
  Filled 2021-12-23: qty 1

## 2021-12-23 MED ORDER — CAFFEINE 200 MG PO TABS
200.0000 mg | ORAL_TABLET | Freq: Once | ORAL | Status: AC
  Administered 2021-12-23: 200 mg via ORAL
  Filled 2021-12-23: qty 1

## 2021-12-23 NOTE — MAU Note (Signed)
.  Marcia Gonzalez is a 26 y.o. at [redacted]w[redacted]d here in MAU reporting: HA 5/10, cramping lower back pain 5/10 that began yesterday along with abdominal pain that has since resolved. Denies visual changes, abnormal swelling, or RUQ pain. Reports good FM. Denies VB or LOF. Reports hx of cHTN for which she takes Labetolol.   Onset of complaint: yesterday Pain score: see note Vitals:   12/23/21 1826 12/23/21 1834  BP: (!) 118/57 116/64  Pulse: 75 67  Resp: 16   Temp: 98.2 F (36.8 C)   SpO2: 100%      FHT:145 Lab orders placed from triage:  UA

## 2021-12-23 NOTE — MAU Provider Note (Addendum)
History     CSN: [id number]  Arrival date and time: 12/23/21 1740   Event Date/Time   First Provider Initiated Contact with Patient 12/23/21 1848      Chief Complaint  Patient presents with   Abdominal Pain   Back Pain   Headache   Marcia Gonzalez is a 26 y.o. G3P0020 at [redacted]w[redacted]d who presents today with a headache. She states that the headache started yesterday. She took tylenol last night and again just before she came here, but it has not resolved. She also has multiple other complaints today including sharp abdominal pains that are random, feeling dehydrated, feeling dizzy when she stands up after laying down.   Abdominal Pain Associated symptoms include headaches.  Back Pain Associated symptoms include abdominal pain and headaches.  Headache  This is a new problem. The current episode started yesterday. The problem has been unchanged. The pain is located in the Frontal region. The pain does not radiate. The quality of the pain is described as band-like. The pain is at a severity of 6/10. Associated symptoms include abdominal pain and back pain. Nothing aggravates the symptoms. She has tried acetaminophen for the symptoms. The treatment provided no relief.   OB History     Gravida  3   Para  0   Term  0   Preterm  0   AB  2   Living  0      SAB  2   IAB  0   Ectopic  0   Multiple  0   Live Births  0           Past Medical History:  Diagnosis Date   Allergy    Anemia    Anxiety    Arthritis    Asthma    Depression    Endometriosis    GERD (gastroesophageal reflux disease)    Hypothyroidism    IIH (idiopathic intracranial hypertension)    Wells' syndrome     Past Surgical History:  Procedure Laterality Date   ABDOMINAL EXPLORATION SURGERY  04/2021   DILATION AND CURETTAGE OF UTERUS     FINGER SURGERY     LAPAROTOMY      Family History  Problem Relation Age of Onset   Kidney disease Mother    Hypertension Mother    Hyperlipidemia Mother     Depression Mother    Cancer Mother        cervical   Arthritis Mother    Anxiety disorder Mother    Depression Father    Anxiety disorder Father    Asthma Brother    COPD Maternal Grandmother    Arthritis Maternal Grandmother     Social History   Tobacco Use   Smoking status: Never    Passive exposure: Never   Smokeless tobacco: Never  Vaping Use   Vaping Use: Never used  Substance Use Topics   Alcohol use: Not Currently   Drug use: Never    Allergies:  Allergies  Allergen Reactions   Codeine Nausea Only   Augmentin [Amoxicillin-Pot Clavulanate] Other (See Comments)    Hallucinations   Oxycodone Hives and Itching   Other Rash    Latex tape, causes reaction and blisters.    Medications Prior to Admission  Medication Sig Dispense Refill Last Dose   aspirin 81 MG EC tablet Take 1 tablet (81 mg total) by mouth daily. Swallow whole. 90 tablet 3 12/23/2021   busPIRone (BUSPAR) 5 MG tablet Take 1 tablet (5  mg total) by mouth 3 (three) times daily. 90 tablet 3 12/23/2021   escitalopram (LEXAPRO) 20 MG tablet Take 1 tablet (20 mg total) by mouth daily. 90 tablet 1 12/23/2021   labetalol (NORMODYNE) 200 MG tablet Take 1 tablet (200 mg total) by mouth 2 (two) times daily. 60 tablet 3 12/23/2021   Levothyroxine Sodium 112 MCG CAPS Take 112 mcg by mouth daily before breakfast.   12/23/2021   Prenatal Vit-Fe Fumarate-FA (PRENATAL MULTIVITAMIN) TABS tablet Take 1 tablet by mouth daily at 12 noon.   12/23/2021   albuterol (ACCUNEB) 0.63 MG/3ML nebulizer solution Take 1 ampule by nebulization every 6 (six) hours as needed for wheezing. (Patient not taking: Reported on 10/21/2021)      azithromycin (ZITHROMAX Z-PAK) 250 MG tablet Take 2 tablets today and then 1 a day til finished (Patient not taking: Reported on 12/03/2021) 6 each 0    Blood Pressure Monitor MISC For regular home bp monitoring during pregnancy (Patient not taking: Reported on 11/11/2021) 1 each 0    buPROPion (WELLBUTRIN XL) 150 MG  24 hr tablet Take 150 mg by mouth daily.      docusate sodium (COLACE) 100 MG capsule Take 1 capsule (100 mg total) by mouth 2 (two) times daily as needed. (Patient not taking: Reported on 10/21/2021) 30 capsule 2    famotidine (PEPCID) 20 MG tablet Take 1 tablet (20 mg total) by mouth 2 (two) times daily. 60 tablet 2    ferrous sulfate 325 (65 FE) MG tablet Take 325 mg by mouth daily with breakfast. (Patient not taking: Reported on 10/13/2021)      ondansetron (ZOFRAN-ODT) 4 MG disintegrating tablet Take 1 tablet (4 mg total) by mouth every 8 (eight) hours as needed for nausea or vomiting. (Patient not taking: Reported on 10/13/2021) 30 tablet 2    polyethylene glycol (MIRALAX) 17 g packet Take 17 g by mouth daily. (Patient not taking: Reported on 10/21/2021) 14 each 0    pramoxine-hydrocortisone (PROCTOCREAM-HC) 1-1 % rectal cream Place 1 application. rectally 2 (two) times daily. 30 g 3    progesterone (PROMETRIUM) 200 MG capsule Take 400 mg by mouth daily. (Patient not taking: Reported on 11/11/2021)      promethazine (PHENERGAN) 12.5 MG tablet Take 1 tablet (12.5 mg total) by mouth every 6 (six) hours as needed for nausea or vomiting. (Patient not taking: Reported on 12/03/2021) 30 tablet 0     Review of Systems  Gastrointestinal:  Positive for abdominal pain.  Musculoskeletal:  Positive for back pain.  Neurological:  Positive for headaches.  All other systems reviewed and are negative. Physical Exam   Blood pressure 116/64, pulse 67, temperature 98.2 F (36.8 C), temperature source Oral, resp. rate 16, height 5\' 5"  (1.651 m), weight 106.9 kg, last menstrual period 07/22/2021, SpO2 100 %, unknown if currently breastfeeding.  Physical Exam Constitutional:      Appearance: She is well-developed.  HENT:     Head: Normocephalic.  Eyes:     Pupils: Pupils are equal, round, and reactive to light.  Cardiovascular:     Rate and Rhythm: Normal rate and regular rhythm.     Heart sounds: Normal heart  sounds.  Pulmonary:     Effort: Pulmonary effort is normal. No respiratory distress.     Breath sounds: Normal breath sounds.  Abdominal:     Palpations: Abdomen is soft.     Tenderness: There is no abdominal tenderness.  Genitourinary:    Vagina: No bleeding. Vaginal discharge:  mucusy.    Comments: External: no lesion Vagina: small amount of white discharge     Musculoskeletal:        General: Normal range of motion.     Cervical back: Normal range of motion and neck supple.  Skin:    General: Skin is warm and dry.  Neurological:     Mental Status: She is alert and oriented to person, place, and time.  Psychiatric:        Mood and Affect: Mood normal.        Behavior: Behavior normal.  FHT 145 with doppler   Results for orders placed or performed during the hospital encounter of 12/23/21 (from the past 24 hour(s))  Urinalysis, Routine w reflex microscopic     Status: Abnormal   Collection Time: 12/23/21  6:50 PM  Result Value Ref Range   Color, Urine AMBER (A) YELLOW   APPearance CLOUDY (A) CLEAR   Specific Gravity, Urine 1.024 1.005 - 1.030   pH 7.0 5.0 - 8.0   Glucose, UA NEGATIVE NEGATIVE mg/dL   Hgb urine dipstick NEGATIVE NEGATIVE   Bilirubin Urine NEGATIVE NEGATIVE   Ketones, ur 5 (A) NEGATIVE mg/dL   Protein, ur 30 (A) NEGATIVE mg/dL   Nitrite NEGATIVE NEGATIVE   Leukocytes,Ua TRACE (A) NEGATIVE   RBC / HPF 0-5 0 - 5 RBC/hpf   WBC, UA 0-5 0 - 5 WBC/hpf   Bacteria, UA FEW (A) NONE SEEN   Squamous Epithelial / LPF 21-50 0 - 5   Mucus PRESENT    Ca Oxalate Crys, UA PRESENT     MAU Course  Procedures  MDM Patient given 200mg  caffeine, reglan, benadryl and IV fluid bolus  8:03 PM Care turned to [facility], CNM   [facility] DNP, CNM  12/23/21  8:03 PM   2045: Headache resolved and IVFs complete.  Assessment and Plan  Headache in pregnancy, antepartum, second trimester  - Advised to take Tylenol 1000 mg po every 8 hours prn pain  [redacted] weeks  gestation of pregnancy   - Discharge patient - Keep scheduled appointment with [facility] on 12/31/2021 - Patient verbalized an understanding of the plan of care and agrees.   01/02/2022, CNM  12/23/2021 8:56 PM

## 2021-12-24 ENCOUNTER — Telehealth: Payer: Self-pay | Admitting: *Deleted

## 2021-12-24 ENCOUNTER — Other Ambulatory Visit: Payer: Self-pay | Admitting: Certified Nurse Midwife

## 2021-12-24 ENCOUNTER — Other Ambulatory Visit: Payer: Self-pay | Admitting: Advanced Practice Midwife

## 2021-12-24 MED ORDER — CYCLOBENZAPRINE HCL 10 MG PO TABS: 10.0000 mg | ORAL_TABLET | Freq: Three times a day (TID) | ORAL | 1 refills | Status: DC | PRN

## 2021-12-24 NOTE — Telephone Encounter (Signed)
Patient called stating she went to MAU last night with c/o headache and back pain. She is still having pain in her lower back which radiates down her left leg along with nausea, sciatica pain. Says it hurts to walk and the pain is all the time. She has tried stretching, using a heating pad along with ice and neither seem to be helping.  Denies discharge, fever, body aches, chills, pain with urination.  Discussed with Philipp Deputy, CNM and will send in Flexeril.  Patient advised to continue to monitor after taking medication and if symptoms do not resolve or worsen, to let us know or go to [facility]  Verbalized understanding with no further questions.

## 2021-12-24 NOTE — Progress Notes (Signed)
TC from Access nurse Wells Guiles, pt awaiting Rx be sent to pharmacy. Reviewed nurses note from office and plan for Flexeril however doesn't appear to have been sent. Will send Rx now.

## 2021-12-27 ENCOUNTER — Encounter: Payer: Self-pay | Admitting: Women's Health

## 2021-12-31 ENCOUNTER — Encounter: Payer: Self-pay | Admitting: Obstetrics & Gynecology

## 2021-12-31 ENCOUNTER — Ambulatory Visit (INDEPENDENT_AMBULATORY_CARE_PROVIDER_SITE_OTHER): Payer: BC Managed Care – PPO | Admitting: Obstetrics & Gynecology

## 2021-12-31 VITALS — BP 113/61 | HR 95 | Wt 234.8 lb

## 2021-12-31 DIAGNOSIS — O0992 Supervision of high risk pregnancy, unspecified, second trimester: Secondary | ICD-10-CM

## 2021-12-31 DIAGNOSIS — Z3A24 24 weeks gestation of pregnancy: Secondary | ICD-10-CM

## 2021-12-31 LAB — POCT URINALYSIS DIPSTICK OB
Blood, UA: NEGATIVE
Glucose, UA: NEGATIVE
Ketones, UA: NEGATIVE
Nitrite, UA: NEGATIVE
POC,PROTEIN,UA: NEGATIVE

## 2021-12-31 NOTE — Progress Notes (Signed)
HIGH-RISK PREGNANCY VISIT Patient name: Marcia Gonzalez MRN [id number]  Date of birth: August 15, 1995 Chief Complaint:   Routine Prenatal Visit  History of Present Illness:   Marcia Gonzalez is a 26 y.o. G43P0020 female at [redacted]w[redacted]d with an Estimated Date of Delivery: 04/22/22 being seen today for ongoing management of a high-risk pregnancy complicated by:  Chronic HTN- doing well with current medication Hypothyroidism Asthma- no issues Anxiety.    Today she reports  notes some headaches- improved with caffeine.  No other acute complaints . PT desires UTI testing due to back pain.   . Vag. Bleeding: None.  Movement: Present. denies leaking of fluid.      10/21/2021    2:55 PM 09/19/2021    2:39 PM 08/13/2021    3:46 PM 06/30/2021    3:19 PM  Depression screen PHQ 2/9  Decreased Interest 1 1 1 1   Down, Depressed, Hopeless 1 1 1 2   PHQ - 2 Score 2 2 2 3   Altered sleeping 3 0 1 2  Tired, decreased energy 3 0 1 2  Change in appetite 3 0 1 1  Feeling bad or failure about yourself  0 0 1 1  Trouble concentrating 0 0 1 0  Moving slowly or fidgety/restless 0 0 1 1  Suicidal thoughts 0 0 0 0  PHQ-9 Score 11 2 8 10   Difficult doing work/chores  Not difficult at all Somewhat difficult Very difficult     Current Outpatient Medications  Medication Instructions   albuterol (ACCUNEB) 0.63 MG/3ML nebulizer solution 1 ampule, Every 6 hours PRN   aspirin EC 81 mg, Oral, Daily, Swallow whole.   Blood Pressure Monitor MISC For regular home bp monitoring during pregnancy   buPROPion (WELLBUTRIN XL) 150 mg, Oral, Daily   busPIRone (BUSPAR) 5 mg, Oral, 3 times daily   cyclobenzaprine (FLEXERIL) 10 mg, Oral, 3 times daily PRN   docusate sodium (COLACE) 100 mg, Oral, 2 times daily PRN   escitalopram (LEXAPRO) 20 mg, Oral, Daily   famotidine (PEPCID) 20 mg, Oral, 2 times daily   ferrous sulfate 325 mg, Daily with breakfast   labetalol (NORMODYNE) 200 mg, Oral, 2 times daily   Levothyroxine Sodium 112 mcg,  Oral, Daily before breakfast   ondansetron (ZOFRAN-ODT) 4 mg, Oral, Every 8 hours PRN   polyethylene glycol (MIRALAX) 17 g, Oral, Daily   pramoxine-hydrocortisone (PROCTOCREAM-HC) 1-1 % rectal cream 1 application , Rectal, 2 times daily   Prenatal Vit-Fe Fumarate-FA (PRENATAL MULTIVITAMIN) TABS tablet 1 tablet, Oral, Daily   progesterone (PROMETRIUM) 400 mg, Daily   promethazine (PHENERGAN) 12.5 mg, Oral, Every 6 hours PRN     Review of Systems:   Pertinent items are noted in HPI Denies abnormal vaginal discharge w/ itching/odor/irritation,  visual changes, shortness of breath, chest pain, abdominal pain, severe nausea/vomiting or bowel movements unless otherwise stated above. Pertinent History Reviewed:  Reviewed past medical,surgical, social, obstetrical and family history.  Reviewed problem list, medications and allergies. Physical Assessment:   Vitals:   12/31/21 1601  BP: 113/61  Pulse: 95  Weight: 234 lb 12.8 oz (106.5 kg)  Body mass index is 39.07 kg/m.           Physical Examination:   General appearance: alert, well appearing, and in no distress  Mental status: normal mood, behavior, speech, dress, motor activity, and thought processes  Skin: warm & dry   Extremities: Edema: None    Cardiovascular: normal heart rate noted  Respiratory: normal respiratory effort, no distress  Abdomen: gravid, soft, non-tender  Pelvic: Cervical exam deferred         Fetal Status: Fetal Heart Rate (bpm): 145 Fundal Height: 25 cm Movement: Present    Fetal Surveillance Testing today: doppler   Chaperone: N/A    Results for orders placed or performed in visit on 12/31/21 (from the past 24 hour(s))  POC Urinalysis Dipstick OB   Collection Time: 12/31/21  4:04 PM  Result Value Ref Range   Color, UA     Clarity, UA     Glucose, UA Negative Negative   Bilirubin, UA     Ketones, UA neg    Spec Grav, UA     Blood, UA neg    pH, UA     POC,PROTEIN,UA Negative Negative, Trace, Small  (1+), Moderate (2+), Large (3+), 4+   Urobilinogen, UA     Nitrite, UA neg    Leukocytes, UA Trace (A) Negative   Appearance     Odor       Assessment & Plan:  High-risk pregnancy: G3P0020 at [redacted]w[redacted]d with an Estimated Date of Delivery: 04/22/22   1) chronic HTN -continue current medication -growth q 4wk- will start @ 30wk  2) Hypothyroidism []  TSH in 3rd trimester  3) anxiety- stable 4) asthma-stable  Meds: No orders of the defined types were placed in this encounter.   Labs/procedures today: none  Treatment Plan:  as outlined above  Reviewed: Preterm labor symptoms and general obstetric precautions including but not limited to vaginal bleeding, contractions, leaking of fluid and fetal movement were reviewed in detail with the patient.  All questions were answered. Pt has home bp cuff. Check bp weekly, let us know if >140/90.   Follow-up: Return in about 4 weeks (around 01/28/2022) for HROB visit and PN-2.   No future appointments.  Orders Placed This Encounter  Procedures   Urine Culture   POC Urinalysis Dipstick OB    Janyth Pupa, DO Attending [place]egino Ramirez, [facility] for [employer], [place]

## 2022-01-02 LAB — URINE CULTURE

## 2022-01-06 ENCOUNTER — Encounter: Payer: Self-pay | Admitting: Obstetrics & Gynecology

## 2022-01-12 MED ORDER — ONDANSETRON 8 MG PO TBDP: 8.0000 mg | ORAL_TABLET | Freq: Three times a day (TID) | ORAL | 0 refills | Status: DC | PRN

## 2022-01-19 ENCOUNTER — Other Ambulatory Visit: Payer: Self-pay | Admitting: Certified Nurse Midwife

## 2022-01-21 ENCOUNTER — Encounter: Payer: Self-pay | Admitting: Obstetrics & Gynecology

## 2022-01-21 ENCOUNTER — Encounter (HOSPITAL_COMMUNITY): Payer: Self-pay | Admitting: Obstetrics & Gynecology

## 2022-01-21 ENCOUNTER — Other Ambulatory Visit: Payer: Self-pay

## 2022-01-21 ENCOUNTER — Telehealth: Payer: Self-pay

## 2022-01-21 ENCOUNTER — Inpatient Hospital Stay (HOSPITAL_COMMUNITY)
Admission: AD | Admit: 2022-01-21 | Discharge: 2022-01-22 | Disposition: A | Discharge status: Home / Self Care | Payer: BC Managed Care – PPO | Attending: Obstetrics & Gynecology | Admitting: Obstetrics & Gynecology

## 2022-01-21 ENCOUNTER — Ambulatory Visit (INDEPENDENT_AMBULATORY_CARE_PROVIDER_SITE_OTHER): Payer: BC Managed Care – PPO | Admitting: Obstetrics & Gynecology

## 2022-01-21 VITALS — BP 88/49 | HR 93 | Wt 235.8 lb

## 2022-01-21 DIAGNOSIS — R112 Nausea with vomiting, unspecified: Secondary | ICD-10-CM

## 2022-01-21 DIAGNOSIS — R1031 Right lower quadrant pain: Secondary | ICD-10-CM | POA: Insufficient documentation

## 2022-01-21 DIAGNOSIS — R109 Unspecified abdominal pain: Secondary | ICD-10-CM | POA: Diagnosis not present

## 2022-01-21 DIAGNOSIS — O99612 Diseases of the digestive system complicating pregnancy, second trimester: Secondary | ICD-10-CM | POA: Diagnosis not present

## 2022-01-21 DIAGNOSIS — K219 Gastro-esophageal reflux disease without esophagitis: Secondary | ICD-10-CM | POA: Diagnosis not present

## 2022-01-21 DIAGNOSIS — R197 Diarrhea, unspecified: Secondary | ICD-10-CM | POA: Diagnosis not present

## 2022-01-21 DIAGNOSIS — O212 Late vomiting of pregnancy: Secondary | ICD-10-CM | POA: Insufficient documentation

## 2022-01-21 DIAGNOSIS — Z79899 Other long term (current) drug therapy: Secondary | ICD-10-CM | POA: Insufficient documentation

## 2022-01-21 DIAGNOSIS — Z3A27 27 weeks gestation of pregnancy: Secondary | ICD-10-CM | POA: Diagnosis not present

## 2022-01-21 DIAGNOSIS — N1330 Unspecified hydronephrosis: Secondary | ICD-10-CM | POA: Diagnosis not present

## 2022-01-21 DIAGNOSIS — O26899 Other specified pregnancy related conditions, unspecified trimester: Secondary | ICD-10-CM | POA: Diagnosis not present

## 2022-01-21 DIAGNOSIS — O0992 Supervision of high risk pregnancy, unspecified, second trimester: Secondary | ICD-10-CM

## 2022-01-21 DIAGNOSIS — O2652 Maternal hypotension syndrome, second trimester: Secondary | ICD-10-CM | POA: Insufficient documentation

## 2022-01-21 DIAGNOSIS — O26892 Other specified pregnancy related conditions, second trimester: Secondary | ICD-10-CM | POA: Insufficient documentation

## 2022-01-21 DIAGNOSIS — Z8719 Personal history of other diseases of the digestive system: Secondary | ICD-10-CM | POA: Diagnosis not present

## 2022-01-21 DIAGNOSIS — O10919 Unspecified pre-existing hypertension complicating pregnancy, unspecified trimester: Secondary | ICD-10-CM

## 2022-01-21 LAB — COMPREHENSIVE METABOLIC PANEL
ALT: 19 U/L (ref 0–44)
AST: 15 U/L (ref 15–41)
Albumin: 3 g/dL — ABNORMAL LOW (ref 3.5–5.0)
Alkaline Phosphatase: 68 U/L (ref 38–126)
Anion gap: 11 (ref 5–15)
BUN: <5 mg/dL — ABNORMAL LOW (ref 6–20)
CO2: 21 mmol/L — ABNORMAL LOW (ref 22–32)
Calcium: 9.2 mg/dL (ref 8.9–10.3)
Chloride: 106 mmol/L (ref 98–111)
Creatinine, Ser: 0.55 mg/dL (ref 0.44–1.00)
GFR, Estimated: >60 mL/min (ref >60)
Glucose, Bld: 80 mg/dL (ref 70–99)
Potassium: 4.1 mmol/L (ref 3.5–5.1)
Sodium: 138 mmol/L (ref 135–145)
Total Bilirubin: 0.8 mg/dL (ref 0.3–1.2)
Total Protein: 6.4 g/dL — ABNORMAL LOW (ref 6.5–8.1)

## 2022-01-21 LAB — CBC
HCT: 34.3 % — ABNORMAL LOW (ref 36.0–46.0)
Hemoglobin: 11.2 g/dL — ABNORMAL LOW (ref 12.0–15.0)
MCH: 30.2 pg (ref 26.0–34.0)
MCHC: 32.7 g/dL (ref 30.0–36.0)
MCV: 92.5 fL (ref 80.0–100.0)
Platelets: 296 10*3/uL (ref 150–400)
RBC: 3.71 MIL/uL — ABNORMAL LOW (ref 3.87–5.11)
RDW: 12.9 % (ref 11.5–15.5)
WBC: 8.7 10*3/uL (ref 4.0–10.5)
nRBC: 0 % (ref 0.0–0.2)

## 2022-01-21 LAB — URINALYSIS, ROUTINE W REFLEX MICROSCOPIC
Bilirubin Urine: NEGATIVE
Glucose, UA: NEGATIVE mg/dL
Hgb urine dipstick: NEGATIVE
Ketones, ur: 5 mg/dL — AB
Leukocytes,Ua: NEGATIVE
Nitrite: NEGATIVE
Protein, ur: NEGATIVE mg/dL
Specific Gravity, Urine: 1.013 (ref 1.005–1.030)
pH: 7 (ref 5.0–8.0)

## 2022-01-21 LAB — WET PREP, GENITAL
Clue Cells Wet Prep HPF POC: NONE SEEN
Sperm: NONE SEEN
Trich, Wet Prep: NONE SEEN
WBC, Wet Prep HPF POC: >=10 — AB (ref <10)
Yeast Wet Prep HPF POC: NONE SEEN

## 2022-01-21 LAB — MAGNESIUM: Magnesium: 1.9 mg/dL (ref 1.7–2.4)

## 2022-01-21 LAB — TSH: TSH: 2.274 u[IU]/mL (ref 0.350–4.500)

## 2022-01-21 LAB — PHOSPHORUS: Phosphorus: 4.1 mg/dL (ref 2.5–4.6)

## 2022-01-21 MED ORDER — PROMETHAZINE HCL 25 MG PO TABS
12.5000 mg | ORAL_TABLET | Freq: Four times a day (QID) | ORAL | Status: DC | PRN
  Administered 2022-01-21: 12.5 mg via ORAL
  Filled 2022-01-21: qty 1

## 2022-01-21 MED ORDER — OXYCODONE HCL 5 MG PO TABS
10.0000 mg | ORAL_TABLET | Freq: Four times a day (QID) | ORAL | Status: DC | PRN
  Administered 2022-01-21: 10 mg via ORAL
  Filled 2022-01-21: qty 2

## 2022-01-21 MED ORDER — DEXTROSE IN LACTATED RINGERS 5 % IV SOLN: INTRAVENOUS | Status: DC

## 2022-01-21 MED ORDER — LORAZEPAM 2 MG/ML IJ SOLN: 0.5000 mg | Freq: Once | INTRAMUSCULAR | Status: DC

## 2022-01-21 MED ORDER — LORAZEPAM 2 MG/ML IJ SOLN
0.5000 mg | Freq: Once | INTRAMUSCULAR | Status: AC | PRN
  Administered 2022-01-21: 0.5 mg via INTRAVENOUS
  Filled 2022-01-21: qty 1

## 2022-01-21 MED ORDER — LACTATED RINGERS IV BOLUS
1000.0000 mL | Freq: Once | INTRAVENOUS | Status: AC
  Administered 2022-01-21: 1000 mL via INTRAVENOUS

## 2022-01-21 MED ORDER — FAMOTIDINE 20 MG PO TABS
20.0000 mg | ORAL_TABLET | Freq: Every day | ORAL | Status: DC
  Administered 2022-01-21: 20 mg via ORAL
  Filled 2022-01-21: qty 1

## 2022-01-21 NOTE — Telephone Encounter (Signed)
LMOVM returning patient's call.  

## 2022-01-21 NOTE — MAU Note (Signed)
Pt's husband out to ask if pt could have something to eat. RN and MD have explained repeatedly that we cannot eat until imaging is resulted due to risk of surgery. Dr Ephriam Jenkins back at bedside now to discuss same with pt and husband.

## 2022-01-21 NOTE — Progress Notes (Signed)
LOW-RISK PREGNANCY VISIT Patient name: Marcia Gonzalez MRN [id number]  Date of birth: 1996-01-14 Chief Complaint:   w/i diarrhea for 2 weeks, n/v (RUQ pain sometimes radiating to back, dizziness)  History of Present Illness:   Marcia Gonzalez is a 26 y.o. G48P0020 female at [redacted]w[redacted]d with an Estimated Date of Delivery: 04/22/22 being seen today for GI upset.  Notes diarrhea since last Wednesday- reports going 4-6x per day.  Also reports considerable nausea- taking zofran, which is not helping with the nausea, but is helping with the diarrhea (causes constipation for her). Notes decreased appetite.  Notes emesis x 3 since Wednesday. No fever- has not checked, feels flushed, some chills. +FM, no vaginal bleeding, no LOF, no contractions  Notes headache- taking tylenol with no improvement +RUQ pain- also started a few days ago.  Feels worse after meals     10/21/2021    2:55 PM 09/19/2021    2:39 PM 08/13/2021    3:46 PM 06/30/2021    3:19 PM  Depression screen PHQ 2/9  Decreased Interest 1 1 1 1   Down, Depressed, Hopeless 1 1 1 2   PHQ - 2 Score 2 2 2 3   Altered sleeping 3 0 1 2  Tired, decreased energy 3 0 1 2  Change in appetite 3 0 1 1  Feeling bad or failure about yourself  0 0 1 1  Trouble concentrating 0 0 1 0  Moving slowly or fidgety/restless 0 0 1 1  Suicidal thoughts 0 0 0 0  PHQ-9 Score 11 2 8 10   Difficult doing work/chores  Not difficult at all Somewhat difficult Very difficult   Review of Systems:   Pertinent items are noted in HPI No urinary symptoms.  No vaginal discharge, itching or irritation. Pertinent History Reviewed:  Reviewed past medical,surgical, social, obstetrical and family history.  Reviewed problem list, medications and allergies.  Physical Assessment:   Vitals:   01/21/22 1444  BP: (!) 88/49  Pulse: 93  Weight: 235 lb 12.8 oz (107 kg)  Body mass index is 39.24 kg/m.        Physical Examination:   General appearance: ill-appearing  Mental status:  Alert, oriented to person, place, and time  Skin: Warm & dry  Respiratory: Normal respiratory effort, no distress  Abdomen: Soft, gravid, nontender, no reproducible pain  Pelvic: Cervical exam deferred         Extremities: Edema: None  Psych:  mood and affect appropriate  Fetal Status: Fetal Heart Rate (bpm): 140 Fundal Height: 28 cm Movement: Present    Chaperone: n/a    No results found for this or any previous visit (from the past 24 hour(s)).   Assessment & Plan:  1) Low-risk pregnancy G3P0020 at 104w0d with an Estimated Date of Delivery: 04/22/22   2) DDx: Gallstones vs gastritis -BP low normal -pt advised to go to MAU for IV fluids and work up- likely to include imaging studies   Meds: No orders of the defined types were placed in this encounter.  Labs/procedures today: none  Plan:  Continue routine obstetrical care  Next visit: prefers in person    Reviewed: Preterm labor symptoms and general obstetric precautions including but not limited to vaginal bleeding, contractions, leaking of fluid and fetal movement were reviewed in detail with the patient.  All questions were answered.   Follow-up: Return for plan to go to MAU, keep Wed appt.  No orders of the defined types were placed in this encounter.    Nelda Marseille, DO Attending [place], [facility] for [employer], [place]

## 2022-01-21 NOTE — Telephone Encounter (Signed)
Patient states she has had diarrhea since last week, nausea and vomiting started 2 days ago.  Taking Zofran. Has not started taking any new medication or change in her diet. Will have patient come in this afternoon for eval with Dr Charlotta Newton.

## 2022-01-21 NOTE — MAU Note (Signed)
.  Marcia Gonzalez is a 27 y.o. at [redacted]w[redacted]d here in MAU reporting: diarrhea x one week, been having nausea for the last few days and taking zofran. Since taking the zofran the diarrhea is a little better. [facility] and was told to come here because her b/p was low.  Onset of complaint: one week ago. Pt reports positive fetal movement.  Pain score: 6/10 There were no vitals filed for this visit.    Lab orders placed from triage:

## 2022-01-21 NOTE — Telephone Encounter (Signed)
This pt went to the ED for N&V with an upset stomach.. they advised her to be seen today but our schedule is full. Shes [redacted] wks pregnant

## 2022-01-21 NOTE — MAU Note (Signed)
Pt's husband once again asking if pt can have crackers. Once again explained to pt and husband that she could not eat while waiting on imaging. D5LR up at 125.

## 2022-01-21 NOTE — MAU Provider Note (Incomplete Revision)
History     CSN: [id number]  Arrival date and time: 01/21/22 1550   None   HPI 26 yo G3P0020 at [redacted]weeks gestation who presents from office with ~ 1 week of diarrhea that improved with zofran and also with persistent mid-abdominal pain . Also has had ~6 days of nausea and vomiting. Not improving with Zofran.   Was seen in office today and was hypotensive. Here reports still having vomiting but no diarrhea. Also reports that pain started around umbilicus and now in RLQ. Reports pain is twisting in sensation. 4-5/10 without palpation. Denies constipation.   OB History     Gravida  3   Para  0   Term  0   Preterm  0   AB  2   Living  0      SAB  2   IAB  0   Ectopic  0   Multiple  0   Live Births  0           Past Medical History:  Diagnosis Date   Allergy    Anemia    Anxiety    Arthritis    Asthma    Depression    Endometriosis    GERD (gastroesophageal reflux disease)    Hypothyroidism    IIH (idiopathic intracranial hypertension)    Wells' syndrome     Past Surgical History:  Procedure Laterality Date   ABDOMINAL EXPLORATION SURGERY  04/2021   DILATION AND CURETTAGE OF UTERUS     FINGER SURGERY     LAPAROTOMY      Family History  Problem Relation Age of Onset   Kidney disease Mother    Hypertension Mother    Hyperlipidemia Mother    Depression Mother    Cancer Mother        cervical   Arthritis Mother    Anxiety disorder Mother    Depression Father    Anxiety disorder Father    Asthma Brother    COPD Maternal Grandmother    Arthritis Maternal Grandmother     Social History   Tobacco Use   Smoking status: Never    Passive exposure: Never   Smokeless tobacco: Never  Vaping Use   Vaping Use: Never used  Substance Use Topics   Alcohol use: Not Currently   Drug use: Never    Allergies:  Allergies  Allergen Reactions   Codeine Nausea Only   Augmentin [Amoxicillin-Pot Clavulanate] Other (See Comments)    Hallucinations    Oxycodone Hives and Itching   Other Rash    Latex tape, causes reaction and blisters.    Medications Prior to Admission  Medication Sig Dispense Refill Last Dose   aspirin 81 MG EC tablet Take 1 tablet (81 mg total) by mouth daily. Swallow whole. 90 tablet 3 01/21/2022   buPROPion (WELLBUTRIN XL) 150 MG 24 hr tablet Take 150 mg by mouth daily.   01/21/2022   busPIRone (BUSPAR) 5 MG tablet Take 1 tablet (5 mg total) by mouth 3 (three) times daily. 90 tablet 3 01/21/2022   docusate sodium (COLACE) 100 MG capsule Take 1 capsule (100 mg total) by mouth 2 (two) times daily as needed. 30 capsule 2 Past Month   escitalopram (LEXAPRO) 20 MG tablet Take 1 tablet (20 mg total) by mouth daily. 90 tablet 1 01/21/2022   famotidine (PEPCID) 20 MG tablet Take 1 tablet (20 mg total) by mouth 2 (two) times daily. 60 tablet 2 01/21/2022   labetalol (NORMODYNE) 200  MG tablet Take 1 tablet (200 mg total) by mouth 2 (two) times daily. 60 tablet 3 01/21/2022   Levothyroxine Sodium 112 MCG CAPS Take 112 mcg by mouth daily before breakfast.   01/21/2022   ondansetron (ZOFRAN-ODT) 4 MG disintegrating tablet Take 1 tablet (4 mg total) by mouth every 8 (eight) hours as needed for nausea or vomiting. 30 tablet 2 01/21/2022   Prenatal Vit-Fe Fumarate-FA (PRENATAL MULTIVITAMIN) TABS tablet Take 1 tablet by mouth daily at 12 noon.   01/20/2022   albuterol (ACCUNEB) 0.63 MG/3ML nebulizer solution Take 1 ampule by nebulization every 6 (six) hours as needed for wheezing. (Patient not taking: Reported on 10/21/2021)      Blood Pressure Monitor MISC For regular home bp monitoring during pregnancy (Patient not taking: Reported on 11/11/2021) 1 each 0    cyclobenzaprine (FLEXERIL) 10 MG tablet Take 1 tablet (10 mg total) by mouth 3 (three) times daily as needed. (Patient not taking: Reported on 12/31/2021) 30 tablet 1    ferrous sulfate 325 (65 FE) MG tablet Take 325 mg by mouth daily with breakfast. (Patient not taking: Reported on 10/13/2021)       ondansetron (ZOFRAN-ODT) 8 MG disintegrating tablet Take 1 tablet (8 mg total) by mouth every 8 (eight) hours as needed for nausea or vomiting. (Patient not taking: Reported on 01/21/2022) 20 tablet 0    polyethylene glycol (MIRALAX) 17 g packet Take 17 g by mouth daily. (Patient not taking: Reported on 10/21/2021) 14 each 0    pramoxine-hydrocortisone (PROCTOCREAM-HC) 1-1 % rectal cream Place 1 application. rectally 2 (two) times daily. (Patient not taking: Reported on 01/21/2022) 30 g 3    progesterone (PROMETRIUM) 200 MG capsule Take 400 mg by mouth daily. (Patient not taking: Reported on 11/11/2021)      promethazine (PHENERGAN) 12.5 MG tablet Take 1 tablet (12.5 mg total) by mouth every 6 (six) hours as needed for nausea or vomiting. (Patient not taking: Reported on 12/03/2021) 30 tablet 0     Review of Systems  Constitutional:  Negative for fever.  Respiratory:  Negative for shortness of breath.   Gastrointestinal:  Positive for abdominal pain, diarrhea and nausea.  Endocrine: Negative for polyuria.  Genitourinary:  Negative for dysuria.  Musculoskeletal:  Negative for arthralgias.  Allergic/Immunologic: Negative for immunocompromised state.  Neurological:  Negative for dizziness.  Hematological:  Negative for adenopathy.  Psychiatric/Behavioral:  Negative for agitation.    Physical Exam   Blood pressure (!) 117/58, pulse 68, temperature 98.2 F (36.8 C), resp. rate 17, last menstrual period 07/22/2021, SpO2 99 %, unknown if currently breastfeeding.  Physical Exam Vitals and nursing note reviewed.  HENT:     Head: Normocephalic.  Cardiovascular:     Rate and Rhythm: Normal rate.     Pulses: Normal pulses.  Pulmonary:     Effort: Pulmonary effort is normal.  Abdominal:     Tenderness: There is abdominal tenderness. There is guarding.     Comments: RLQ and midabdomen  Genitourinary:    Comments: Patient declined pelvic exam/cervical check Musculoskeletal:        General: Normal  range of motion.  Skin:    General: Skin is warm.     Capillary Refill: Capillary refill takes less than 2 seconds.  Neurological:     General: No focal deficit present.     Mental Status: She is alert and oriented to person, place, and time.  Psychiatric:        Mood and Affect: Mood normal.  MAU Course  Procedures  26 yo G3P0020 at [redacted]weeks gestation who presents from office with ~ 1 week of diarrhea that improved with zofran and also with persistent mid-abdominal pain . Also has had ~6 days of nausea and vomiting. Not improving with Zofran at home. Also reports decreased appetite over the past week.   Tried phenergan which helped with nausea. Also gave IV fluids. With her exam/location of pain as well as concerns and symptoms concern for appendicitis. Ordered CBC, CMP, TSH (to follow up since hx of hypothyroidism) and MRI abdomen/pelvis without contrast  1940 Checked in. Still awaiting MRI  Assessment and Plan   Lower abdominal pain/worse in RLQ Concern for appendicitis Concern for appendicitis vs. Contractions (comes and goes and twisting senation) vs. Possible gallbladder related etiology (previously pain was RUQ). - CMP, Mg, Phos - TSH (hx of hypothyroidism on Synthroid - due for third TM TSH) - CBC - Offered pain meds, at this time patient declines - discussed that given her abdominal pain that is intermittent we recommend cervical check to rule out labor. She declines at this time and states she does not think she is in labor - swabs to rule out infections - MRI to rule out appendicitis, plan to premedicate with IV ativan as patient reports had claustrophobia with prior MRI  2200 Still awaiting MRI. Reports 7-8/10 pain. Offered pain medications. Patient would like to try. Ordered Oxycodone (has allergy listed but patient reports she has reaction/hives only when she has Tylenol+Oxycodone combined formulation). Previously tolerated Oxycodone without any  reaction  2.Nausea/vomiting Last vomit earlier today. Ate toaster strudle this morning. Reports Zofran does not work for nausea. BP in office 80s/50s. Here in 110s/50s.  - IV fluid bolus - CMP and electrolytes - phenergan   3. GERD Patient takes Famotidine. Provided dose here Warner Mccreedy 01/21/2022, 10:38 PM

## 2022-01-21 NOTE — MAU Provider Note (Addendum)
History     CSN: [id number]  Arrival date and time: 01/21/22 1550   None   HPI 26 yo G3P0020 at [redacted]weeks gestation who presents from office with ~ 1 week of diarrhea that improved with zofran and also with persistent mid-abdominal pain . Also has had ~6 days of nausea and vomiting. Not improving with Zofran.   Was seen in office today and was hypotensive. Here reports still having vomiting but no diarrhea. Also reports that pain started around umbilicus and now in RLQ. Reports pain is twisting in sensation. 4-5/10 without palpation. Denies constipation.   OB History     Gravida  3   Para  0   Term  0   Preterm  0   AB  2   Living  0      SAB  2   IAB  0   Ectopic  0   Multiple  0   Live Births  0           Past Medical History:  Diagnosis Date   Allergy    Anemia    Anxiety    Arthritis    Asthma    Depression    Endometriosis    GERD (gastroesophageal reflux disease)    Hypothyroidism    IIH (idiopathic intracranial hypertension)    Wells' syndrome     Past Surgical History:  Procedure Laterality Date   ABDOMINAL EXPLORATION SURGERY  04/2021   DILATION AND CURETTAGE OF UTERUS     FINGER SURGERY     LAPAROTOMY      Family History  Problem Relation Age of Onset   Kidney disease Mother    Hypertension Mother    Hyperlipidemia Mother    Depression Mother    Cancer Mother        cervical   Arthritis Mother    Anxiety disorder Mother    Depression Father    Anxiety disorder Father    Asthma Brother    COPD Maternal Grandmother    Arthritis Maternal Grandmother     Social History   Tobacco Use   Smoking status: Never    Passive exposure: Never   Smokeless tobacco: Never  Vaping Use   Vaping Use: Never used  Substance Use Topics   Alcohol use: Not Currently   Drug use: Never    Allergies:  Allergies  Allergen Reactions   Codeine Nausea Only   Augmentin [Amoxicillin-Pot Clavulanate] Other (See Comments)    Hallucinations    Oxycodone Hives and Itching   Other Rash    Latex tape, causes reaction and blisters.    Medications Prior to Admission  Medication Sig Dispense Refill Last Dose   aspirin 81 MG EC tablet Take 1 tablet (81 mg total) by mouth daily. Swallow whole. 90 tablet 3 01/21/2022   buPROPion (WELLBUTRIN XL) 150 MG 24 hr tablet Take 150 mg by mouth daily.   01/21/2022   busPIRone (BUSPAR) 5 MG tablet Take 1 tablet (5 mg total) by mouth 3 (three) times daily. 90 tablet 3 01/21/2022   docusate sodium (COLACE) 100 MG capsule Take 1 capsule (100 mg total) by mouth 2 (two) times daily as needed. 30 capsule 2 Past Month   escitalopram (LEXAPRO) 20 MG tablet Take 1 tablet (20 mg total) by mouth daily. 90 tablet 1 01/21/2022   famotidine (PEPCID) 20 MG tablet Take 1 tablet (20 mg total) by mouth 2 (two) times daily. 60 tablet 2 01/21/2022   labetalol (NORMODYNE) 200  MG tablet Take 1 tablet (200 mg total) by mouth 2 (two) times daily. 60 tablet 3 01/21/2022   Levothyroxine Sodium 112 MCG CAPS Take 112 mcg by mouth daily before breakfast.   01/21/2022   ondansetron (ZOFRAN-ODT) 4 MG disintegrating tablet Take 1 tablet (4 mg total) by mouth every 8 (eight) hours as needed for nausea or vomiting. 30 tablet 2 01/21/2022   Prenatal Vit-Fe Fumarate-FA (PRENATAL MULTIVITAMIN) TABS tablet Take 1 tablet by mouth daily at 12 noon.   01/20/2022   albuterol (ACCUNEB) 0.63 MG/3ML nebulizer solution Take 1 ampule by nebulization every 6 (six) hours as needed for wheezing. (Patient not taking: Reported on 10/21/2021)      Blood Pressure Monitor MISC For regular home bp monitoring during pregnancy (Patient not taking: Reported on 11/11/2021) 1 each 0    cyclobenzaprine (FLEXERIL) 10 MG tablet Take 1 tablet (10 mg total) by mouth 3 (three) times daily as needed. (Patient not taking: Reported on 12/31/2021) 30 tablet 1    ferrous sulfate 325 (65 FE) MG tablet Take 325 mg by mouth daily with breakfast. (Patient not taking: Reported on 10/13/2021)       ondansetron (ZOFRAN-ODT) 8 MG disintegrating tablet Take 1 tablet (8 mg total) by mouth every 8 (eight) hours as needed for nausea or vomiting. (Patient not taking: Reported on 01/21/2022) 20 tablet 0    polyethylene glycol (MIRALAX) 17 g packet Take 17 g by mouth daily. (Patient not taking: Reported on 10/21/2021) 14 each 0    pramoxine-hydrocortisone (PROCTOCREAM-HC) 1-1 % rectal cream Place 1 application. rectally 2 (two) times daily. (Patient not taking: Reported on 01/21/2022) 30 g 3    progesterone (PROMETRIUM) 200 MG capsule Take 400 mg by mouth daily. (Patient not taking: Reported on 11/11/2021)      promethazine (PHENERGAN) 12.5 MG tablet Take 1 tablet (12.5 mg total) by mouth every 6 (six) hours as needed for nausea or vomiting. (Patient not taking: Reported on 12/03/2021) 30 tablet 0     Review of Systems  Constitutional:  Negative for fever.  Respiratory:  Negative for shortness of breath.   Gastrointestinal:  Positive for abdominal pain, diarrhea and nausea.  Endocrine: Negative for polyuria.  Genitourinary:  Negative for dysuria.  Musculoskeletal:  Negative for arthralgias.  Allergic/Immunologic: Negative for immunocompromised state.  Neurological:  Negative for dizziness.  Hematological:  Negative for adenopathy.  Psychiatric/Behavioral:  Negative for agitation.    Physical Exam   Blood pressure (!) 117/58, pulse 68, temperature 98.2 F (36.8 C), resp. rate 17, last menstrual period 07/22/2021, SpO2 99 %, unknown if currently breastfeeding.  Physical Exam Vitals and nursing note reviewed.  HENT:     Head: Normocephalic.  Cardiovascular:     Rate and Rhythm: Normal rate.     Pulses: Normal pulses.  Pulmonary:     Effort: Pulmonary effort is normal.  Abdominal:     Tenderness: There is abdominal tenderness. There is guarding.     Comments: RLQ and midabdomen  Genitourinary:    Comments: Patient declined pelvic exam/cervical check Musculoskeletal:        General: Normal  range of motion.  Skin:    General: Skin is warm.     Capillary Refill: Capillary refill takes less than 2 seconds.  Neurological:     General: No focal deficit present.     Mental Status: She is alert and oriented to person, place, and time.  Psychiatric:        Mood and Affect: Mood normal.  MAU Course  Procedures  26 yo G3P0020 at [redacted]weeks gestation who presents from office with ~ 1 week of diarrhea that improved with zofran and also with persistent mid-abdominal pain . Also has had ~6 days of nausea and vomiting. Not improving with Zofran at home. Also reports decreased appetite over the past week.   Tried phenergan which helped with nausea. Also gave IV fluids. With her exam/location of pain as well as concerns and symptoms concern for appendicitis. Ordered CBC, CMP, TSH (to follow up since hx of hypothyroidism) and MRI abdomen/pelvis without contrast  1940 Checked in. Still awaiting MRI  Assessment and Plan   Lower abdominal pain/worse in RLQ Concern for appendicitis Concern for appendicitis vs. Contractions (comes and goes and twisting senation) vs. Possible gallbladder related etiology (previously pain was RUQ). - CMP, Mg, Phos - TSH (hx of hypothyroidism on Synthroid - due for third TM TSH) - CBC - Offered pain meds, at this time patient declines - discussed that given her abdominal pain that is intermittent we recommend cervical check to rule out labor. She declines at this time and states she does not think she is in labor - swabs to rule out infections - MRI to rule out appendicitis, plan to premedicate with IV ativan as patient reports had claustrophobia with prior MRI  2200 Still awaiting MRI. Reports 7-8/10 pain. Offered pain medications. Patient would like to try. Ordered Oxycodone (has allergy listed but patient reports she has reaction/hives only when she has Tylenol+Oxycodone combined formulation). Previously tolerated Oxycodone without any  reaction  2.Nausea/vomiting Last vomit earlier today. Ate toaster strudle this morning. Reports Zofran does not work for nausea. BP in office 80s/50s. Here in 110s/50s.  - IV fluid bolus - CMP and electrolytes - phenergan   3. GERD Patient takes Famotidine. Provided dose here Warner Mccreedy 01/21/2022, 10:38 PM   Results for orders placed or performed during the hospital encounter of 01/21/22 (from the past 24 hour(s))  Urinalysis, Routine w reflex microscopic     Status: Abnormal   Collection Time: 01/21/22  4:18 PM  Result Value Ref Range   Color, Urine YELLOW YELLOW   APPearance HAZY (A) CLEAR   Specific Gravity, Urine 1.013 1.005 - 1.030   pH 7.0 5.0 - 8.0   Glucose, UA NEGATIVE NEGATIVE mg/dL   Hgb urine dipstick NEGATIVE NEGATIVE   Bilirubin Urine NEGATIVE NEGATIVE   Ketones, ur 5 (A) NEGATIVE mg/dL   Protein, ur NEGATIVE NEGATIVE mg/dL   Nitrite NEGATIVE NEGATIVE   Leukocytes,Ua NEGATIVE NEGATIVE  Comprehensive metabolic panel     Status: Abnormal   Collection Time: 01/21/22  6:13 PM  Result Value Ref Range   Sodium 138 135 - 145 mmol/L   Potassium 4.1 3.5 - 5.1 mmol/L   Chloride 106 98 - 111 mmol/L   CO2 21 (L) 22 - 32 mmol/L   Glucose, Bld 80 70 - 99 mg/dL   BUN <5 (L) 6 - 20 mg/dL   Creatinine, Ser 5.46 0.44 - 1.00 mg/dL   Calcium 9.2 8.9 - 27.0 mg/dL   Total Protein 6.4 (L) 6.5 - 8.1 g/dL   Albumin 3.0 (L) 3.5 - 5.0 g/dL   AST 15 15 - 41 U/L   ALT 19 0 - 44 U/L   Alkaline Phosphatase 68 38 - 126 U/L   Total Bilirubin 0.8 0.3 - 1.2 mg/dL   GFR, Estimated >35 >00 mL/min   Anion gap 11 5 - 15  Magnesium     Status:  None   Collection Time: 01/21/22  6:13 PM  Result Value Ref Range   Magnesium 1.9 1.7 - 2.4 mg/dL  Phosphorus     Status: None   Collection Time: 01/21/22  6:13 PM  Result Value Ref Range   Phosphorus 4.1 2.5 - 4.6 mg/dL  TSH     Status: None   Collection Time: 01/21/22  6:13 PM  Result Value Ref Range   TSH 2.274 0.350 - 4.500 uIU/mL  Wet prep,  genital     Status: Abnormal   Collection Time: 01/21/22  6:13 PM   Specimen: Vaginal  Result Value Ref Range   Yeast Wet Prep HPF POC NONE SEEN NONE SEEN   Trich, Wet Prep NONE SEEN NONE SEEN   Clue Cells Wet Prep HPF POC NONE SEEN NONE SEEN   WBC, Wet Prep HPF POC >=10 (A) <10   Sperm NONE SEEN   CBC     Status: Abnormal   Collection Time: 01/21/22  6:13 PM  Result Value Ref Range   WBC 8.7 4.0 - 10.5 K/uL   RBC 3.71 (L) 3.87 - 5.11 MIL/uL   Hemoglobin 11.2 (L) 12.0 - 15.0 g/dL   HCT 40.3 (L) 47.4 - 25.9 %   MCV 92.5 80.0 - 100.0 fL   MCH 30.2 26.0 - 34.0 pg   MCHC 32.7 30.0 - 36.0 g/dL   RDW 56.3 87.5 - 64.3 %   Platelets 296 150 - 400 K/uL   nRBC 0.0 0.0 - 0.2 %   MR ABDOMEN WO CONTRAST  Result Date: 01/22/2022 CLINICAL DATA:  Rule out appendicitis. Pregnant female with Twenty-seven week gestation. GI upset with diarrhea and nausea. EXAM: MRI ABDOMEN AND PELVIS WITHOUT CONTRAST TECHNIQUE: Multiplanar multisequence MR imaging of the abdomen and pelvis was performed. No intravenous contrast was administered. COMPARISON:  None Available. FINDINGS: COMBINED FINDINGS FOR BOTH MR ABDOMEN AND PELVIS Lower chest: No acute findings. Hepatobiliary: Small T2 hyperintense structure in segment 8 measures 9 mm, image 12/35. Favor benign cyst or hemangioma. No suspicious liver lesion identified. Normal appearance of the gallbladder. No biliary ductal dilatation Pancreas: No mass, inflammatory changes, or other parenchymal abnormality identified. Spleen:  Within normal limits in size and appearance. Adrenals/Urinary Tract: Normal adrenal glands. Left kidney is unremarkable. There is moderate right hydronephrosis which likely reflects external compression due to gravid uterus. Normal appearance of the bladder. Stomach/Bowel: Stomach appears nondistended. No bowel wall thickening, inflammation or distension. The appendix is not confidently identified separate from the right lower quadrant bowel loops.  There are no secondary signs of acute appendicitis however Vascular/Lymphatic: No pathologically enlarged lymph nodes identified. No abdominal aortic aneurysm demonstrated. Reproductive: Gravid uterus compatible with a [redacted] week gestation. No adnexal mass identified bilaterally. Other:  No free fluid or fluid collections identified Musculoskeletal: No suspicious bone lesions identified. IMPRESSION: 1. The appendix is not confidently identified separate from the right lower quadrant bowel loops. There are no secondary signs of acute appendicitis however. 2. Moderate right hydronephrosis likely reflects external compression due to gravid uterus. Electronically Signed   By: Signa Kell M.D.   On: 01/22/2022 05:30   MR PELVIS WO CONTRAST  Result Date: 01/22/2022 CLINICAL DATA:  Rule out appendicitis. Pregnant female with Twenty-seven week gestation. GI upset with diarrhea and nausea. EXAM: MRI ABDOMEN AND PELVIS WITHOUT CONTRAST TECHNIQUE: Multiplanar multisequence MR imaging of the abdomen and pelvis was performed. No intravenous contrast was administered. COMPARISON:  None Available. FINDINGS: COMBINED FINDINGS FOR BOTH MR ABDOMEN AND  PELVIS Lower chest: No acute findings. Hepatobiliary: Small T2 hyperintense structure in segment 8 measures 9 mm, image 12/35. Favor benign cyst or hemangioma. No suspicious liver lesion identified. Normal appearance of the gallbladder. No biliary ductal dilatation Pancreas: No mass, inflammatory changes, or other parenchymal abnormality identified. Spleen:  Within normal limits in size and appearance. Adrenals/Urinary Tract: Normal adrenal glands. Left kidney is unremarkable. There is moderate right hydronephrosis which likely reflects external compression due to gravid uterus. Normal appearance of the bladder. Stomach/Bowel: Stomach appears nondistended. No bowel wall thickening, inflammation or distension. The appendix is not confidently identified separate from the right lower  quadrant bowel loops. There are no secondary signs of acute appendicitis however Vascular/Lymphatic: No pathologically enlarged lymph nodes identified. No abdominal aortic aneurysm demonstrated. Reproductive: Gravid uterus compatible with a [redacted] week gestation. No adnexal mass identified bilaterally. Other:  No free fluid or fluid collections identified Musculoskeletal: No suspicious bone lesions identified. IMPRESSION: 1. The appendix is not confidently identified separate from the right lower quadrant bowel loops. There are no secondary signs of acute appendicitis however. 2. Moderate right hydronephrosis likely reflects external compression due to gravid uterus. Electronically Signed   By: Kerby Moors M.D.   On: 01/22/2022 05:30    Discussed results with Dr Roselie Awkward who recommends supportive care and discharge No leukocytosis and no sign of appendicitis on MRI  Patient has been sleeping soundly and states pain is significantly improved No further vomiting or diarrhea Vitals:   01/22/22 0150 01/22/22 0406 01/22/22 0407 01/22/22 0411  BP: (!) 112/56 (!) 106/47 (!) 106/47 (!) 106/47  Pulse: 70 71    Resp:      Temp:      TempSrc:      SpO2:       A:  Single IUP at [redacted]w[redacted]d       Reassuring FHR for gestational age       Nausea, vomiting and diarrhea, probable gastroenteritis       Abdominal pain probably related to above  P:   Discharge home        Supportive care        Advance diet as tolerated         Rx Zofran prn nausea         Followup in office       Encouraged to return if she develops worsening of symptoms, increase in pain, fever, or other concerning symptoms.   Seabron Spates, CNM

## 2022-01-22 ENCOUNTER — Inpatient Hospital Stay (HOSPITAL_COMMUNITY): Payer: BC Managed Care – PPO

## 2022-01-22 DIAGNOSIS — Z3A27 27 weeks gestation of pregnancy: Secondary | ICD-10-CM

## 2022-01-22 DIAGNOSIS — N1330 Unspecified hydronephrosis: Secondary | ICD-10-CM | POA: Diagnosis not present

## 2022-01-22 DIAGNOSIS — R109 Unspecified abdominal pain: Secondary | ICD-10-CM

## 2022-01-22 DIAGNOSIS — R112 Nausea with vomiting, unspecified: Secondary | ICD-10-CM

## 2022-01-22 DIAGNOSIS — Z8719 Personal history of other diseases of the digestive system: Secondary | ICD-10-CM | POA: Diagnosis not present

## 2022-01-22 DIAGNOSIS — R197 Diarrhea, unspecified: Secondary | ICD-10-CM

## 2022-01-22 DIAGNOSIS — O26899 Other specified pregnancy related conditions, unspecified trimester: Secondary | ICD-10-CM | POA: Diagnosis not present

## 2022-01-22 DIAGNOSIS — O26892 Other specified pregnancy related conditions, second trimester: Secondary | ICD-10-CM | POA: Diagnosis not present

## 2022-01-22 LAB — GC/CHLAMYDIA PROBE AMP (~~LOC~~) NOT AT ARMC
Chlamydia: NEGATIVE
Comment: NEGATIVE
Comment: NORMAL
Neisseria Gonorrhea: NEGATIVE

## 2022-01-22 MED ORDER — LORAZEPAM 2 MG/ML IJ SOLN
0.5000 mg | Freq: Once | INTRAMUSCULAR | Status: AC | PRN
  Administered 2022-01-22: 0.5 mg via INTRAVENOUS
  Filled 2022-01-22: qty 1

## 2022-01-22 MED ORDER — ONDANSETRON HCL 8 MG PO TABS: 8.0000 mg | ORAL_TABLET | Freq: Three times a day (TID) | ORAL | 0 refills | Status: DC | PRN

## 2022-01-22 MED ORDER — BUSPIRONE HCL 5 MG PO TABS
5.0000 mg | ORAL_TABLET | Freq: Once | ORAL | Status: AC
  Administered 2022-01-22: 5 mg via ORAL
  Filled 2022-01-22: qty 1

## 2022-01-28 ENCOUNTER — Encounter: Payer: Self-pay | Admitting: Advanced Practice Midwife

## 2022-01-28 ENCOUNTER — Other Ambulatory Visit: Payer: BC Managed Care – PPO

## 2022-01-28 ENCOUNTER — Ambulatory Visit (INDEPENDENT_AMBULATORY_CARE_PROVIDER_SITE_OTHER): Payer: BC Managed Care – PPO | Admitting: Advanced Practice Midwife

## 2022-01-28 VITALS — BP 105/64 | HR 80 | Wt 235.0 lb

## 2022-01-28 DIAGNOSIS — O10912 Unspecified pre-existing hypertension complicating pregnancy, second trimester: Secondary | ICD-10-CM

## 2022-01-28 DIAGNOSIS — O10919 Unspecified pre-existing hypertension complicating pregnancy, unspecified trimester: Secondary | ICD-10-CM

## 2022-01-28 DIAGNOSIS — O0992 Supervision of high risk pregnancy, unspecified, second trimester: Secondary | ICD-10-CM | POA: Diagnosis not present

## 2022-01-28 DIAGNOSIS — Z3A28 28 weeks gestation of pregnancy: Secondary | ICD-10-CM

## 2022-01-28 DIAGNOSIS — Z131 Encounter for screening for diabetes mellitus: Secondary | ICD-10-CM

## 2022-01-28 DIAGNOSIS — Z3A27 27 weeks gestation of pregnancy: Secondary | ICD-10-CM

## 2022-01-28 NOTE — Progress Notes (Signed)
HIGH-RISK PREGNANCY VISIT Patient name: Marcia Gonzalez MRN [id number]  Date of birth: 1996/02/07 Chief Complaint:   Routine Prenatal Visit (PN2)  History of Present Illness:   Marcia Gonzalez is a 26 y.o. G6P0020 female at [redacted]w[redacted]d with an Estimated Date of Delivery: 04/22/22 being seen today for ongoing management of a high-risk pregnancy complicated by chronic hypertension currently on Lab 200mg  bid.    Today she reports  having MAU visit 7/5 for 'twisting' abd pain around umbilicus, also in lower abd and on R side> neg workup; still w similar pain now; also nauseous from GTT . Contractions: Not present. Vag. Bleeding: None.  Movement: Present. denies leaking of fluid.      01/28/2022   10:12 AM 10/21/2021    2:55 PM 09/19/2021    2:39 PM 08/13/2021    3:46 PM 06/30/2021    3:19 PM  Depression screen PHQ 2/9  Decreased Interest 1 1 1 1 1   Down, Depressed, Hopeless 1 1 1 1 2   PHQ - 2 Score 2 2 2 2 3   Altered sleeping 3 3 0 1 2  Tired, decreased energy 3 3 0 1 2  Change in appetite 3 3 0 1 1  Feeling bad or failure about yourself  2 0 0 1 1  Trouble concentrating 1 0 0 1 0  Moving slowly or fidgety/restless 0 0 0 1 1  Suicidal thoughts 0 0 0 0 0  PHQ-9 Score 14 11 2 8 10   Difficult doing work/chores   Not difficult at all Somewhat difficult Very difficult        01/28/2022   10:12 AM 10/21/2021    3:26 PM 09/19/2021    2:40 PM 08/13/2021    3:47 PM  GAD 7 : Generalized Anxiety Score  Nervous, Anxious, on Edge 2 0 2 1  Control/stop worrying 1 1 3  0  Worry too much - different things 3 1 3  0  Trouble relaxing 2 0 3 1  Restless 0 0 2 0  Easily annoyed or irritable 3 3 3 1   Afraid - awful might happen 1 3 3 1   Total GAD 7 Score 12 8 19 4   Anxiety Difficulty   Extremely difficult Somewhat difficult     Review of Systems:   Pertinent items are noted in HPI Denies abnormal vaginal discharge w/ itching/odor/irritation, headaches, visual changes, shortness of breath, chest pain,  abdominal pain, severe nausea/vomiting, or problems with urination or bowel movements unless otherwise stated above. Pertinent History Reviewed:  Reviewed past medical,surgical, social, obstetrical and family history.  Reviewed problem list, medications and allergies. Physical Assessment:   Vitals:   01/28/22 1014  BP: 105/64  Pulse: 80  Weight: 235 lb (106.6 kg)  Body mass index is 39.11 kg/m.           Physical Examination:   General appearance: alert, nauseous from GTT drink  Mental status: alert, oriented to person, place, and time  Skin: warm & dry   Extremities: Edema: None    Cardiovascular: normal heart rate noted  Respiratory: normal respiratory effort, no distress  Abdomen: gravid, soft, non-tender  Pelvic: Cervical exam deferred         Fetal Status: Fetal Heart Rate (bpm): 136 Fundal Height: 29 cm Movement: Present    Fetal Surveillance Testing today: doppler    No results found for this or any previous visit (from the past 24 hour(s)).  Assessment & Plan:  High-risk pregnancy: G3P0020 at [redacted]w[redacted]d with an Estimated  Date of Delivery: 04/22/22   1) cHTN, stable on Lab 200mg  bid; start ante testing @ 32wks; growth q 4wks (will get scheduled out today)  2) Hashimoto's thyroiditis, check TSH with next visit  3) Depression/anxiety, stable on Wellbutrin & Buspar  4) Abd pain around umbilicus, ruled out appendicitis in MAU 01/21/22  Meds: No orders of the defined types were placed in this encounter.   Labs/procedures today: PN2  Treatment Plan:  Growth u/s q 4wks    2x/wk testing nst/sono @ 32wks     Deliver 38-39wks (37wks or prn if poor control)____   Reviewed: Preterm labor symptoms and general obstetric precautions including but not limited to vaginal bleeding, contractions, leaking of fluid and fetal movement were reviewed in detail with the patient.  All questions were answered. Does have home bp cuff. Office bp cuff given: not applicable. Check bp daily, let 03/24/22  know if consistently >140 and/or >90.  Follow-up: Return for 2wk HROB & TSH; 4wk HROB+BPP/growth, then weekly HROB/BPP.   Future Appointments  Date Time Provider Department Center  02/11/2022  9:15 AM [facility] None  02/11/2022 10:10 AM 02/13/2022, DO [facility]  03/11/2022  8:30 AM [facility] 03/13/2022 [facility] None  03/11/2022  9:30 AM 03/13/2022, DO [facility]  04/09/2022  8:30 AM [facility] 04/11/2022 [facility] None  04/09/2022 10:10 AM [place], 04/11/2022, MD [facility]    Orders Placed This Encounter  Procedures   Amaryllis Dyke Fetal BPP W/O Non Stress   US OB Follow Up   [facility] 01/28/2022 10:33 AM

## 2022-01-28 NOTE — Patient Instructions (Signed)
Amore, thank you for choosing our office today! We appreciate the opportunity to meet your healthcare needs. You may receive a short survey by mail, e-mail, or through MyChart. If you are happy with your care we would appreciate if you could take just a few minutes to complete the survey questions. We read all of your comments and take your feedback very seriously. Thank you again for choosing our office.  [facility] ([address] [place], [place] [address]) Entrance C, located off of [address] Free 24/7 valet parking   CLASSES: Go to [email or web address] to register for classes (childbirth, breastfeeding, waterbirth, infant CPR, daddy bootcamp, etc.)  Call the office ([phone]) or go to [facility] if: You begin to have strong, frequent contractions Your water breaks.  Sometimes it is a big gush of fluid, sometimes it is just a trickle that keeps getting your panties wet or running down your legs You have vaginal bleeding.  It is normal to have a small amount of spotting if your cervix was checked.  You don't feel your baby moving like normal.  If you don't, get you something to eat and drink and lay down and focus on feeling your baby move.   If your baby is still not moving like normal, you should call the office or go to [facility].  Call the office ([phone]) or go to [facility] for these signs of pre-eclampsia: Severe headache that does not go away with Tylenol Visual changes- seeing spots, double, blurred vision Pain under your right breast or upper abdomen that does not go away with Tums or heartburn medicine Nausea and/or vomiting Severe swelling in your hands, feet, and face   Tdap Vaccine It is recommended that you get the Tdap vaccine during the third trimester of EACH pregnancy to help protect your baby from getting pertussis (whooping cough) 27-36 weeks is the BEST time to do  this so that you can pass the protection on to your baby. During pregnancy is better than after pregnancy, but if you are unable to get it during pregnancy it will be offered at the hospital.  You can get this vaccine with us, at the health department, your family doctor, or some local pharmacies Everyone who will be around your baby should also be up-to-date on their vaccines before the baby comes. Adults (who are not pregnant) only need 1 dose of Tdap during adulthood.   [place] Pediatricians/Family Doctors [facility] ([facility]): [address], [phone]           [facility]: [address], [phone]                [facility] ([facility]): [address], [phone] (call to ask if accepting patients) [facility]: [address], [place], [phone]    [place] Pediatricians/Family Doctors [facility] ([facility]): [address], [phone] [facility]: [address], [phone] [facility]: [address], [phone]  [place] [facility]  [facility] ([facility]): [phone] [facility]: [address], [phone]   [place] [facility]: [address], [phone]  [place] [facility]  [facility]: [address], [phone]  Home Blood Pressure Monitoring for Patients   Your provider has recommended that you check your   blood pressure (BP) at least once a week at home. If you do not have a blood pressure cuff at home, one will be provided for you. Contact your provider if you have not received your monitor within 1 week.   Helpful Tips for Accurate Home Blood Pressure Checks  Don't smoke, exercise, or drink caffeine 30 minutes before checking your BP Use the restroom before checking your BP (a full bladder can raise your  pressure) Relax in a comfortable upright chair Feet on the ground Left arm resting comfortably on a flat surface at the level of your heart Legs uncrossed Back supported Sit quietly and don't talk Place the cuff on your bare arm Adjust snuggly, so that only two fingertips can fit between your skin and the top of the cuff Check 2 readings separated by at least one minute Keep a log of your BP readings For a visual, please reference this diagram: http://ccnc.care/bpdiagram  Provider Name: [facility]     Phone: [phone]  Zone 1: ALL CLEAR  Continue to monitor your symptoms:  BP reading is less than 140 (top number) or less than 90 (bottom number)  No right upper stomach pain No headaches or seeing spots No feeling nauseated or throwing up No swelling in face and hands  Zone 2: CAUTION Call your doctor's office for any of the following:  BP reading is greater than 140 (top number) or greater than 90 (bottom number)  Stomach pain under your ribs in the middle or right side Headaches or seeing spots Feeling nauseated or throwing up Swelling in face and hands  Zone 3: EMERGENCY  Seek immediate medical care if you have any of the following:  BP reading is greater than160 (top number) or greater than 110 (bottom number) Severe headaches not improving with Tylenol Serious difficulty catching your breath Any worsening symptoms from Zone 2   Third Trimester of Pregnancy The third trimester is from week 29 through week 42, months 7 through 9. The third trimester is a time when the fetus is growing rapidly. At the end of the ninth month, the fetus is about 20 inches in length and weighs 6-10 pounds.  BODY CHANGES Your body goes through many changes during pregnancy. The changes vary from woman to woman.  Your weight will continue to increase. You can expect to gain 25-35 pounds (11-16 kg) by the end of the pregnancy. You may begin to get stretch marks on your hips, abdomen,  and breasts. You may urinate more often because the fetus is moving lower into your pelvis and pressing on your bladder. You may develop or continue to have heartburn as a result of your pregnancy. You may develop constipation because certain hormones are causing the muscles that push waste through your intestines to slow down. You may develop hemorrhoids or swollen, bulging veins (varicose veins). You may have pelvic pain because of the weight gain and pregnancy hormones relaxing your joints between the bones in your pelvis. Backaches may result from overexertion of the muscles supporting your posture. You may have changes in your hair. These can include thickening of your hair, rapid growth, and changes in texture. Some women also have hair loss during or after pregnancy, or hair that feels dry or thin. Your hair will most likely return to normal after your baby is born. Your breasts will continue to grow and be tender. A yellow discharge may leak from your breasts called colostrum. Your belly button may stick out. You may   feel short of breath because of your expanding uterus. You may notice the fetus "dropping," or moving lower in your abdomen. You may have a bloody mucus discharge. This usually occurs a few days to a week before labor begins. Your cervix becomes thin and soft (effaced) near your due date. WHAT TO EXPECT AT YOUR PRENATAL EXAMS  You will have prenatal exams every 2 weeks until week 36. Then, you will have weekly prenatal exams. During a routine prenatal visit: You will be weighed to make sure you and the fetus are growing normally. Your blood pressure is taken. Your abdomen will be measured to track your baby's growth. The fetal heartbeat will be listened to. Any test results from the previous visit will be discussed. You may have a cervical check near your due date to see if you have effaced. At around 36 weeks, your caregiver will check your cervix. At the same time, your  caregiver will also perform a test on the secretions of the vaginal tissue. This test is to determine if a type of bacteria, Group B streptococcus, is present. Your caregiver will explain this further. Your caregiver may ask you: What your birth plan is. How you are feeling. If you are feeling the baby move. If you have had any abnormal symptoms, such as leaking fluid, bleeding, severe headaches, or abdominal cramping. If you have any questions. Other tests or screenings that may be performed during your third trimester include: Blood tests that check for low iron levels (anemia). Fetal testing to check the health, activity level, and growth of the fetus. Testing is done if you have certain medical conditions or if there are problems during the pregnancy. FALSE LABOR You may feel small, irregular contractions that eventually go away. These are called Braxton Hicks contractions, or false labor. Contractions may last for hours, days, or even weeks before true labor sets in. If contractions come at regular intervals, intensify, or become painful, it is best to be seen by your caregiver.  SIGNS OF LABOR  Menstrual-like cramps. Contractions that are 5 minutes apart or less. Contractions that start on the top of the uterus and spread down to the lower abdomen and back. A sense of increased pelvic pressure or back pain. A watery or bloody mucus discharge that comes from the vagina. If you have any of these signs before the 37th week of pregnancy, call your caregiver right away. You need to go to the hospital to get checked immediately. HOME CARE INSTRUCTIONS  Avoid all smoking, herbs, alcohol, and unprescribed drugs. These chemicals affect the formation and growth of the baby. Follow your caregiver's instructions regarding medicine use. There are medicines that are either safe or unsafe to take during pregnancy. Exercise only as directed by your caregiver. Experiencing uterine cramps is a good sign to  stop exercising. Continue to eat regular, healthy meals. Wear a good support bra for breast tenderness. Do not use hot tubs, steam rooms, or saunas. Wear your seat belt at all times when driving. Avoid raw meat, uncooked cheese, cat litter boxes, and soil used by cats. These carry germs that can cause birth defects in the baby. Take your prenatal vitamins. Try taking a stool softener (if your caregiver approves) if you develop constipation. Eat more high-fiber foods, such as fresh vegetables or fruit and whole grains. Drink plenty of fluids to keep your urine clear or pale yellow. Take warm sitz baths to soothe any pain or discomfort caused by hemorrhoids. Use hemorrhoid cream if   your caregiver approves. If you develop varicose veins, wear support hose. Elevate your feet for 15 minutes, 3-4 times a day. Limit salt in your diet. Avoid heavy lifting, wear low heal shoes, and practice good posture. Rest a lot with your legs elevated if you have leg cramps or low back pain. Visit your dentist if you have not gone during your pregnancy. Use a soft toothbrush to brush your teeth and be gentle when you floss. A sexual relationship may be continued unless your caregiver directs you otherwise. Do not travel far distances unless it is absolutely necessary and only with the approval of your caregiver. Take prenatal classes to understand, practice, and ask questions about the labor and delivery. Make a trial run to the hospital. Pack your hospital bag. Prepare the baby's nursery. Continue to go to all your prenatal visits as directed by your caregiver. SEEK MEDICAL CARE IF: You are unsure if you are in labor or if your water has broken. You have dizziness. You have mild pelvic cramps, pelvic pressure, or nagging pain in your abdominal area. You have persistent nausea, vomiting, or diarrhea. You have a bad smelling vaginal discharge. You have pain with urination. SEEK IMMEDIATE MEDICAL CARE IF:  You  have a fever. You are leaking fluid from your vagina. You have spotting or bleeding from your vagina. You have severe abdominal cramping or pain. You have rapid weight loss or gain. You have shortness of breath with chest pain. You notice sudden or extreme swelling of your face, hands, ankles, feet, or legs. You have not felt your baby move in over an hour. You have severe headaches that do not go away with medicine. You have vision changes. Document Released: 06/30/2001 Document Revised: 07/11/2013 Document Reviewed: 09/06/2012 [facility] Patient Information 2015 [place], [place]. This information is not intended to replace advice given to you by your health care provider. Make sure you discuss any questions you have with your health care provider.

## 2022-01-29 LAB — CBC
Hematocrit: 33.8 % — ABNORMAL LOW (ref 34.0–46.6)
Hemoglobin: 11.2 g/dL (ref 11.1–15.9)
MCH: 30.6 pg (ref 26.6–33.0)
MCHC: 33.1 g/dL (ref 31.5–35.7)
MCV: 92 fL (ref 79–97)
Platelets: 301 10*3/uL (ref 150–450)
RBC: 3.66 x10E6/uL — ABNORMAL LOW (ref 3.77–5.28)
RDW: 12.4 % (ref 11.7–15.4)
WBC: 9.4 10*3/uL (ref 3.4–10.8)

## 2022-01-29 LAB — RPR: RPR Ser Ql: NONREACTIVE

## 2022-01-29 LAB — GLUCOSE TOLERANCE, 2 HOURS W/ 1HR
Glucose, 1 hour: 121 mg/dL (ref 70–179)
Glucose, 2 hour: 99 mg/dL (ref 70–152)
Glucose, Fasting: 86 mg/dL (ref 70–91)

## 2022-01-29 LAB — ANTIBODY SCREEN: Antibody Screen: NEGATIVE

## 2022-01-29 LAB — HIV ANTIBODY (ROUTINE TESTING W REFLEX): HIV Screen 4th Generation wRfx: NONREACTIVE

## 2022-02-11 ENCOUNTER — Ambulatory Visit (INDEPENDENT_AMBULATORY_CARE_PROVIDER_SITE_OTHER): Payer: BC Managed Care – PPO

## 2022-02-11 ENCOUNTER — Ambulatory Visit (INDEPENDENT_AMBULATORY_CARE_PROVIDER_SITE_OTHER): Payer: BC Managed Care – PPO | Admitting: Obstetrics & Gynecology

## 2022-02-11 ENCOUNTER — Encounter: Payer: Self-pay | Admitting: Obstetrics & Gynecology

## 2022-02-11 VITALS — BP 106/65 | HR 84 | Wt 236.0 lb

## 2022-02-11 DIAGNOSIS — O10919 Unspecified pre-existing hypertension complicating pregnancy, unspecified trimester: Secondary | ICD-10-CM

## 2022-02-11 DIAGNOSIS — E039 Hypothyroidism, unspecified: Secondary | ICD-10-CM | POA: Diagnosis not present

## 2022-02-11 DIAGNOSIS — Z3A30 30 weeks gestation of pregnancy: Secondary | ICD-10-CM | POA: Diagnosis not present

## 2022-02-11 DIAGNOSIS — O0992 Supervision of high risk pregnancy, unspecified, second trimester: Secondary | ICD-10-CM

## 2022-02-11 DIAGNOSIS — O99283 Endocrine, nutritional and metabolic diseases complicating pregnancy, third trimester: Secondary | ICD-10-CM | POA: Diagnosis not present

## 2022-02-11 DIAGNOSIS — Z23 Encounter for immunization: Secondary | ICD-10-CM | POA: Diagnosis not present

## 2022-02-11 LAB — POCT URINALYSIS DIPSTICK OB
Blood, UA: NEGATIVE
Glucose, UA: NEGATIVE
Ketones, UA: NEGATIVE
Leukocytes, UA: NEGATIVE
Nitrite, UA: NEGATIVE

## 2022-02-11 NOTE — Progress Notes (Signed)
[place] 30 wks,cephalic,posterior placenta gr 0,normal ovaries,AFI 15.7 cm,FHR 140 bpm,EFW 1719 g 78%

## 2022-02-11 NOTE — Progress Notes (Signed)
HIGH-RISK PREGNANCY VISIT Patient name: Marcia Gonzalez MRN [id number]  Date of birth: 1996/02/21 Chief Complaint:   Routine Prenatal Visit (Bpp/Lower back pain, constipation)  History of Present Illness:   Marcia Gonzalez is a 26 y.o. G60P0020 female at [redacted]w[redacted]d with an Estimated Date of Delivery: 04/22/22 being seen today for ongoing management of a high-risk pregnancy complicated by: Chronic HTN on Labetalol 200mg  bid Hypothyroidism Asthma- no issues Anxiety- on Wellbutrin, Lexapro, Buspar  Today she reports no complaints.   Contractions: Not present. Vag. Bleeding: None.  Movement: Present. denies leaking of fluid.      01/28/2022   10:12 AM 10/21/2021    2:55 PM 09/19/2021    2:39 PM 08/13/2021    3:46 PM 06/30/2021    3:19 PM  Depression screen PHQ 2/9  Decreased Interest 1 1 1 1 1   Down, Depressed, Hopeless 1 1 1 1 2   PHQ - 2 Score 2 2 2 2 3   Altered sleeping 3 3 0 1 2  Tired, decreased energy 3 3 0 1 2  Change in appetite 3 3 0 1 1  Feeling bad or failure about yourself  2 0 0 1 1  Trouble concentrating 1 0 0 1 0  Moving slowly or fidgety/restless 0 0 0 1 1  Suicidal thoughts 0 0 0 0 0  PHQ-9 Score 14 11 2 8 10   Difficult doing work/chores   Not difficult at all Somewhat difficult Very difficult     Current Outpatient Medications  Medication Instructions   albuterol (ACCUNEB) 0.63 MG/3ML nebulizer solution 1 ampule, Every 6 hours PRN   aspirin EC 81 mg, Oral, Daily, Swallow whole.   Blood Pressure Monitor MISC For regular home bp monitoring during pregnancy   buPROPion (WELLBUTRIN XL) 150 mg, Oral, Daily   busPIRone (BUSPAR) 5 mg, Oral, 3 times daily   cyclobenzaprine (FLEXERIL) 10 mg, Oral, 3 times daily PRN   docusate sodium (COLACE) 100 mg, Oral, 2 times daily PRN   escitalopram (LEXAPRO) 20 mg, Oral, Daily   famotidine (PEPCID) 20 mg, Oral, 2 times daily   ferrous sulfate 325 mg, Oral, Daily with breakfast   labetalol (NORMODYNE) 200 mg, Oral, 2 times daily    Levothyroxine Sodium 112 mcg, Oral, Daily before breakfast   ondansetron (ZOFRAN) 8 mg, Oral, Every 8 hours PRN   ondansetron (ZOFRAN-ODT) 4 mg, Oral, Every 8 hours PRN   polyethylene glycol (MIRALAX) 17 g, Oral, Daily   Prenatal Vit-Fe Fumarate-FA (PRENATAL MULTIVITAMIN) TABS tablet 1 tablet, Oral, Daily   promethazine (PHENERGAN) 12.5 mg, Oral, Every 6 hours PRN     Review of Systems:   Pertinent items are noted in HPI Denies abnormal vaginal discharge w/ itching/odor/irritation, headaches, visual changes, shortness of breath, chest pain, abdominal pain, severe nausea/vomiting, or problems with urination or bowel movements unless otherwise stated above. Pertinent History Reviewed:  Reviewed past medical,surgical, social, obstetrical and family history.  Reviewed problem list, medications and allergies. Physical Assessment:   Vitals:   02/11/22 1011  BP: 106/65  Pulse: 84  Weight: 236 lb (107 kg)  Body mass index is 39.27 kg/m.           Physical Examination:   General appearance: alert, well appearing, and in no distress  Mental status: normal mood, behavior, speech, dress, motor activity, and thought processes  Skin: warm & dry   Extremities: Edema: None    Cardiovascular: normal heart rate noted  Respiratory: normal respiratory effort, no distress  Abdomen: gravid, soft,  non-tender  Pelvic: Cervical exam deferred         Fetal Status:     Movement: Present    Fetal Surveillance Testing today: [place] 30 wks,cephalic,posterior placenta gr 0,normal ovaries,AFI 15.7 cm,FHR 140 bpm,EFW 1719 g 78%   Chaperone: N/A    Results for orders placed or performed in visit on 02/11/22 (from the past 24 hour(s))  POC Urinalysis Dipstick OB   Collection Time: 02/11/22 10:15 AM  Result Value Ref Range   Color, UA     Clarity, UA     Glucose, UA Negative Negative   Bilirubin, UA     Ketones, UA neg    Spec Grav, UA     Blood, UA neg    pH, UA     POC,PROTEIN,UA Trace Negative, Trace,  Small (1+), Moderate (2+), Large (3+), 4+   Urobilinogen, UA     Nitrite, UA neg    Leukocytes, UA Negative Negative   Appearance     Odor       Assessment & Plan:  High-risk pregnancy: G3P0020 at [redacted]w[redacted]d with an Estimated Date of Delivery: 04/22/22   1) Chronic HTN -continue Labetalol 200mg  bid and ASA -start antepartum testing @ 32wks -discussed IOL 37-39wks pending BP management  2) Anxiety -encouraged pt to find therapist -continue current medication  Meds: No orders of the defined types were placed in this encounter.   Labs/procedures today: growth, Tdap  Treatment Plan:  as outlined above  Reviewed: Preterm labor symptoms and general obstetric precautions including but not limited to vaginal bleeding, contractions, leaking of fluid and fetal movement were reviewed in detail with the patient.  All questions were answered. Pt has home bp cuff. Check bp weekly, let know if >140/90.   Follow-up: Return in about 2 weeks (around 02/25/2022) for HROB visit.   Future Appointments  Date Time Provider Department Center  03/11/2022  8:30 AM [facility] None  03/11/2022  9:30 AM 03/13/2022, DO [facility]  04/09/2022  8:30 AM [facility] 04/11/2022 [facility] None  04/09/2022 10:10 AM [place], 04/11/2022, MD [facility]    Orders Placed This Encounter  Procedures   POC Urinalysis Dipstick OB    Amaryllis Dyke, DO Attending Obstetrician & Gynecologist, [facility] for [facility]

## 2022-02-12 LAB — TSH: TSH: 2.1 u[IU]/mL (ref 0.450–4.500)

## 2022-02-17 ENCOUNTER — Inpatient Hospital Stay (HOSPITAL_COMMUNITY)
Admission: AD | Admit: 2022-02-17 | Discharge: 2022-02-17 | Disposition: A | Discharge status: Home / Self Care | Payer: BC Managed Care – PPO | Attending: Obstetrics and Gynecology | Admitting: Obstetrics and Gynecology

## 2022-02-17 ENCOUNTER — Encounter (HOSPITAL_COMMUNITY): Payer: Self-pay | Admitting: Obstetrics and Gynecology

## 2022-02-17 ENCOUNTER — Other Ambulatory Visit: Payer: Self-pay

## 2022-02-17 DIAGNOSIS — E039 Hypothyroidism, unspecified: Secondary | ICD-10-CM | POA: Diagnosis not present

## 2022-02-17 DIAGNOSIS — Z3A30 30 weeks gestation of pregnancy: Secondary | ICD-10-CM | POA: Diagnosis not present

## 2022-02-17 DIAGNOSIS — R519 Headache, unspecified: Secondary | ICD-10-CM | POA: Diagnosis not present

## 2022-02-17 DIAGNOSIS — Z3689 Encounter for other specified antenatal screening: Secondary | ICD-10-CM | POA: Diagnosis not present

## 2022-02-17 DIAGNOSIS — Z79890 Hormone replacement therapy (postmenopausal): Secondary | ICD-10-CM | POA: Diagnosis not present

## 2022-02-17 DIAGNOSIS — O99343 Other mental disorders complicating pregnancy, third trimester: Secondary | ICD-10-CM | POA: Insufficient documentation

## 2022-02-17 DIAGNOSIS — F419 Anxiety disorder, unspecified: Secondary | ICD-10-CM | POA: Insufficient documentation

## 2022-02-17 DIAGNOSIS — G932 Benign intracranial hypertension: Secondary | ICD-10-CM | POA: Insufficient documentation

## 2022-02-17 DIAGNOSIS — Z79899 Other long term (current) drug therapy: Secondary | ICD-10-CM | POA: Insufficient documentation

## 2022-02-17 DIAGNOSIS — F32A Depression, unspecified: Secondary | ICD-10-CM | POA: Insufficient documentation

## 2022-02-17 DIAGNOSIS — O212 Late vomiting of pregnancy: Secondary | ICD-10-CM | POA: Diagnosis not present

## 2022-02-17 DIAGNOSIS — O99353 Diseases of the nervous system complicating pregnancy, third trimester: Secondary | ICD-10-CM | POA: Diagnosis not present

## 2022-02-17 DIAGNOSIS — R197 Diarrhea, unspecified: Secondary | ICD-10-CM | POA: Diagnosis not present

## 2022-02-17 DIAGNOSIS — O26893 Other specified pregnancy related conditions, third trimester: Secondary | ICD-10-CM | POA: Diagnosis not present

## 2022-02-17 DIAGNOSIS — O99283 Endocrine, nutritional and metabolic diseases complicating pregnancy, third trimester: Secondary | ICD-10-CM | POA: Insufficient documentation

## 2022-02-17 LAB — PROTEIN / CREATININE RATIO, URINE
Creatinine, Urine: 103 mg/dL
Total Protein, Urine: <6 mg/dL

## 2022-02-17 LAB — RAPID URINE DRUG SCREEN, HOSP PERFORMED
Amphetamines: NOT DETECTED
Barbiturates: NOT DETECTED
Benzodiazepines: NOT DETECTED
Cocaine: NOT DETECTED
Opiates: NOT DETECTED
Tetrahydrocannabinol: NOT DETECTED

## 2022-02-17 LAB — COMPREHENSIVE METABOLIC PANEL
ALT: 16 U/L (ref 0–44)
AST: 24 U/L (ref 15–41)
Albumin: 2.8 g/dL — ABNORMAL LOW (ref 3.5–5.0)
Alkaline Phosphatase: 77 U/L (ref 38–126)
Anion gap: 9 (ref 5–15)
BUN: 6 mg/dL (ref 6–20)
CO2: 20 mmol/L — ABNORMAL LOW (ref 22–32)
Calcium: 8.9 mg/dL (ref 8.9–10.3)
Chloride: 105 mmol/L (ref 98–111)
Creatinine, Ser: 0.58 mg/dL (ref 0.44–1.00)
GFR, Estimated: >60 mL/min (ref >60)
Glucose, Bld: 95 mg/dL (ref 70–99)
Potassium: 4 mmol/L (ref 3.5–5.1)
Sodium: 134 mmol/L — ABNORMAL LOW (ref 135–145)
Total Bilirubin: 0.4 mg/dL (ref 0.3–1.2)
Total Protein: 6.3 g/dL — ABNORMAL LOW (ref 6.5–8.1)

## 2022-02-17 LAB — URINALYSIS, ROUTINE W REFLEX MICROSCOPIC
Bilirubin Urine: NEGATIVE
Glucose, UA: NEGATIVE mg/dL
Hgb urine dipstick: NEGATIVE
Ketones, ur: NEGATIVE mg/dL
Leukocytes,Ua: NEGATIVE
Nitrite: NEGATIVE
Protein, ur: NEGATIVE mg/dL
Specific Gravity, Urine: 1.012 (ref 1.005–1.030)
pH: 6 (ref 5.0–8.0)

## 2022-02-17 LAB — CBC
HCT: 31.8 % — ABNORMAL LOW (ref 36.0–46.0)
Hemoglobin: 11.1 g/dL — ABNORMAL LOW (ref 12.0–15.0)
MCH: 31.6 pg (ref 26.0–34.0)
MCHC: 34.9 g/dL (ref 30.0–36.0)
MCV: 90.6 fL (ref 80.0–100.0)
Platelets: 278 10*3/uL (ref 150–400)
RBC: 3.51 MIL/uL — ABNORMAL LOW (ref 3.87–5.11)
RDW: 12.7 % (ref 11.5–15.5)
WBC: 10.4 10*3/uL (ref 4.0–10.5)
nRBC: 0 % (ref 0.0–0.2)

## 2022-02-17 MED ORDER — DEXAMETHASONE SODIUM PHOSPHATE 10 MG/ML IJ SOLN
10.0000 mg | Freq: Once | INTRAMUSCULAR | Status: AC
  Administered 2022-02-17: 10 mg via INTRAVENOUS
  Filled 2022-02-17: qty 1

## 2022-02-17 MED ORDER — LACTATED RINGERS IV SOLN: Freq: Once | INTRAVENOUS | Status: AC

## 2022-02-17 MED ORDER — LABETALOL HCL 100 MG PO TABS
200.0000 mg | ORAL_TABLET | Freq: Once | ORAL | Status: AC
  Administered 2022-02-17: 200 mg via ORAL
  Filled 2022-02-17: qty 2

## 2022-02-17 MED ORDER — LOPERAMIDE HCL 2 MG PO CAPS
2.0000 mg | ORAL_CAPSULE | Freq: Once | ORAL | Status: AC
  Administered 2022-02-17: 2 mg via ORAL
  Filled 2022-02-17: qty 1

## 2022-02-17 MED ORDER — BUSPIRONE HCL 5 MG PO TABS
5.0000 mg | ORAL_TABLET | Freq: Once | ORAL | Status: AC
  Administered 2022-02-17: 5 mg via ORAL
  Filled 2022-02-17: qty 1

## 2022-02-17 MED ORDER — DIPHENHYDRAMINE HCL 50 MG/ML IJ SOLN
25.0000 mg | Freq: Once | INTRAMUSCULAR | Status: AC
  Administered 2022-02-17: 25 mg via INTRAVENOUS
  Filled 2022-02-17: qty 1

## 2022-02-17 MED ORDER — PROMETHAZINE HCL 25 MG/ML IJ SOLN
25.0000 mg | Freq: Once | INTRAVENOUS | Status: AC
  Administered 2022-02-17: 25 mg via INTRAVENOUS
  Filled 2022-02-17: qty 1

## 2022-02-17 MED ORDER — LOPERAMIDE HCL 2 MG PO CAPS: 2.0000 mg | ORAL_CAPSULE | Freq: Four times a day (QID) | ORAL | 0 refills | Status: AC | PRN

## 2022-02-17 NOTE — MAU Provider Note (Addendum)
History     CSN: [id number]  Arrival date and time: 02/17/22 0008   Event Date/Time   First Provider Initiated Contact with Patient 02/17/22 0041      Chief Complaint  Patient presents with   Abdominal Pain   Emesis   Headache   HPI Marcia Gonzalez is a 26 y.o. G3P0020 at [redacted]w[redacted]d who presents to MAU with multiple complaints:  Headache This is a recurrent problem, occurring in the setting of Idiopathic Intracranial Hypertension. Current headache began this morning. Pain score is 8/10. Patient has attempted management with Tylenol but did not experience relief. Most recent dose of Tylenol was 10 or 1030pm. She denies visual disturbances, weakness or syncope.  Diarrhea This is a recurrent problem, original onset around 01/14/2022. Patient was evaluated for abdominal pain and diarrhea in MAU 07/05. She estimates 20 episodes of diarrhea in the past 24 hours. She has not taken medication for this complaint. She denies exposure to new foods, irritants or allergens.   Vomiting  This is a recurrent problem.  Patient reports eating a bowl of cereal for breakfast and a hamburger and fries in the afternoon. She was able to tolerate the cereal but not the hamburger and fries. She previously managed her vomiting with Zofran but discontinued use due to constipation. She has not taken any antiemetics today (Monday 02/16/22). When asked if her vomiting improves if she follows a completely bland diet, patient replies that she has never tried that.  Abdominal pain This is a recurrent problem, occurring in conjunction with vomiting. Pain is described as "tight" and "crampy". Patient reports abdominal pain to MAU registration but reported abdominal pain score of 0/10 throughout encounter.  Patient's pregnancy is complicated by [facility] on Labetalol 200 mg BID., Hypothyroidism on Synthroid, and Depression with Anxiety. Patient states she did not take her evening doses of Labetalol or Buspar.   Patient receives  care with [facility] and her next appointment is 02/26/2022.  OB History     Gravida  3   Para  0   Term  0   Preterm  0   AB  2   Living  0      SAB  2   IAB  0   Ectopic  0   Multiple  0   Live Births  0           Past Medical History:  Diagnosis Date   Allergy    Anemia    Anxiety    Arthritis    Asthma    Depression    Endometriosis    GERD (gastroesophageal reflux disease)    Hypothyroidism    IIH (idiopathic intracranial hypertension)    Wells' syndrome     Past Surgical History:  Procedure Laterality Date   ABDOMINAL EXPLORATION SURGERY  04/2021   DILATION AND CURETTAGE OF UTERUS     FINGER SURGERY     LAPAROTOMY      Family History  Problem Relation Age of Onset   Kidney disease Mother    Hypertension Mother    Hyperlipidemia Mother    Depression Mother    Cancer Mother        cervical   Arthritis Mother    Anxiety disorder Mother    Depression Father    Anxiety disorder Father    Asthma Brother    COPD Maternal Grandmother    Arthritis Maternal Grandmother     Social History   Tobacco Use   Smoking status:  Never    Passive exposure: Never   Smokeless tobacco: Never  Vaping Use   Vaping Use: Never used  Substance Use Topics   Alcohol use: Not Currently   Drug use: Never    Allergies:  Allergies  Allergen Reactions   Codeine Nausea Only   Augmentin [Amoxicillin-Pot Clavulanate] Other (See Comments)    Hallucinations   Oxycodone Hives and Itching   Other Rash    Latex tape, causes reaction and blisters.    Medications Prior to Admission  Medication Sig Dispense Refill Last Dose   aspirin 81 MG EC tablet Take 1 tablet (81 mg total) by mouth daily. Swallow whole. 90 tablet 3 02/16/2022   buPROPion (WELLBUTRIN XL) 150 MG 24 hr tablet Take 150 mg by mouth daily.   02/16/2022   busPIRone (BUSPAR) 5 MG tablet Take 1 tablet (5 mg total) by mouth 3 (three) times daily. 90 tablet 3 02/16/2022   escitalopram (LEXAPRO)  20 MG tablet Take 1 tablet (20 mg total) by mouth daily. 90 tablet 1 02/16/2022   famotidine (PEPCID) 20 MG tablet Take 1 tablet (20 mg total) by mouth 2 (two) times daily. 60 tablet 2 02/16/2022   ferrous sulfate 325 (65 FE) MG tablet Take 325 mg by mouth daily with breakfast.   02/16/2022   labetalol (NORMODYNE) 200 MG tablet Take 1 tablet (200 mg total) by mouth 2 (two) times daily. 60 tablet 3 02/16/2022   Levothyroxine Sodium 112 MCG CAPS Take 112 mcg by mouth daily before breakfast.   02/16/2022   Prenatal Vit-Fe Fumarate-FA (PRENATAL MULTIVITAMIN) TABS tablet Take 1 tablet by mouth daily at 12 noon.   02/16/2022   promethazine (PHENERGAN) 12.5 MG tablet Take 1 tablet (12.5 mg total) by mouth every 6 (six) hours as needed for nausea or vomiting. 30 tablet 0 02/16/2022   albuterol (ACCUNEB) 0.63 MG/3ML nebulizer solution Take 1 ampule by nebulization every 6 (six) hours as needed for wheezing. (Patient not taking: Reported on 10/21/2021)      Blood Pressure Monitor MISC For regular home bp monitoring during pregnancy 1 each 0    cyclobenzaprine (FLEXERIL) 10 MG tablet Take 1 tablet (10 mg total) by mouth 3 (three) times daily as needed. (Patient not taking: Reported on 12/31/2021) 30 tablet 1    docusate sodium (COLACE) 100 MG capsule Take 1 capsule (100 mg total) by mouth 2 (two) times daily as needed. 30 capsule 2    ondansetron (ZOFRAN) 8 MG tablet Take 1 tablet (8 mg total) by mouth every 8 (eight) hours as needed for nausea or vomiting. (Patient not taking: Reported on 01/28/2022) 20 tablet 0    ondansetron (ZOFRAN-ODT) 4 MG disintegrating tablet Take 1 tablet (4 mg total) by mouth every 8 (eight) hours as needed for nausea or vomiting. 30 tablet 2    polyethylene glycol (MIRALAX) 17 g packet Take 17 g by mouth daily. (Patient not taking: Reported on 10/21/2021) 14 each 0     Review of Systems  Gastrointestinal:  Positive for abdominal pain, diarrhea, nausea and vomiting.  Neurological:  Positive for  headaches.  All other systems reviewed and are negative.  Physical Exam   Blood pressure 131/74, pulse 89, temperature 97.9 F (36.6 C), temperature source Oral, resp. rate 18, height 5\' 5"  (1.651 m), weight 107.9 kg, last menstrual period 07/22/2021, SpO2 97 %, unknown if currently breastfeeding.  Physical Exam Vitals and nursing note reviewed. Exam conducted with a chaperone present.  Constitutional:  General: She is not in acute distress.    Appearance: She is well-developed. She is obese. She is not ill-appearing.  Cardiovascular:     Rate and Rhythm: Normal rate and regular rhythm.     Heart sounds: Normal heart sounds.  Pulmonary:     Effort: Pulmonary effort is normal.     Breath sounds: Normal breath sounds.  Abdominal:     Palpations: Abdomen is soft.     Tenderness: There is no abdominal tenderness.  Skin:    Capillary Refill: Capillary refill takes less than 2 seconds.  Neurological:     Mental Status: She is alert and oriented to person, place, and time.  Psychiatric:        Mood and Affect: Mood normal.        Behavior: Behavior normal.     MAU Course  Procedures  MDM  --Reactive tracing: baseline 140, mod var, + accels, no decels  --Toco: UI  0210: Patient pushed call bell to notify Morrie Sheldon, RN she had new onset rash on her chin and neck, hands were cold and pale. CNM at bedside immediately after. No skin rash observed, visualized natural minute changes in complexion consistent with initial assessment. Hands cold bilaterally but pulses normal, cap refill normal, grip strength equal and at baseline bilaterally.  0230: CNM returned to bedside patient in restroom. Verbalizes her headache is better and she is comfortable going home.   Orders Placed This Encounter  Procedures   Urinalysis, Routine w reflex microscopic   Rapid urine drug screen (hospital performed)   CBC   Comprehensive metabolic panel   Protein / creatinine ratio, urine   Insert  peripheral IV   Results for orders placed or performed during the hospital encounter of 02/17/22 (from the past 24 hour(s))  Urinalysis, Routine w reflex microscopic     Status: None   Collection Time: 02/17/22 12:12 AM  Result Value Ref Range   Color, Urine YELLOW YELLOW   APPearance CLEAR CLEAR   Specific Gravity, Urine 1.012 1.005 - 1.030   pH 6.0 5.0 - 8.0   Glucose, UA NEGATIVE NEGATIVE mg/dL   Hgb urine dipstick NEGATIVE NEGATIVE   Bilirubin Urine NEGATIVE NEGATIVE   Ketones, ur NEGATIVE NEGATIVE mg/dL   Protein, ur NEGATIVE NEGATIVE mg/dL   Nitrite NEGATIVE NEGATIVE   Leukocytes,Ua NEGATIVE NEGATIVE  Protein / creatinine ratio, urine     Status: None   Collection Time: 02/17/22 12:12 AM  Result Value Ref Range   Creatinine, Urine 103 mg/dL   Total Protein, Urine <6 mg/dL   Protein Creatinine Ratio        0.00 - 0.15 mg/mg[Cre]  Rapid urine drug screen (hospital performed)     Status: None   Collection Time: 02/17/22 12:22 AM  Result Value Ref Range   Opiates NONE DETECTED NONE DETECTED   Cocaine NONE DETECTED NONE DETECTED   Benzodiazepines NONE DETECTED NONE DETECTED   Amphetamines NONE DETECTED NONE DETECTED   Tetrahydrocannabinol NONE DETECTED NONE DETECTED   Barbiturates NONE DETECTED NONE DETECTED  CBC     Status: Abnormal   Collection Time: 02/17/22  1:18 AM  Result Value Ref Range   WBC 10.4 4.0 - 10.5 K/uL   RBC 3.51 (L) 3.87 - 5.11 MIL/uL   Hemoglobin 11.1 (L) 12.0 - 15.0 g/dL   HCT 63.0 (L) 16.0 - 10.9 %   MCV 90.6 80.0 - 100.0 fL   MCH 31.6 26.0 - 34.0 pg   MCHC 34.9 30.0 -  36.0 g/dL   RDW 15.3 79.4 - 32.7 %   Platelets 278 150 - 400 K/uL   nRBC 0.0 0.0 - 0.2 %  Comprehensive metabolic panel     Status: Abnormal   Collection Time: 02/17/22  1:18 AM  Result Value Ref Range   Sodium 134 (L) 135 - 145 mmol/L   Potassium 4.0 3.5 - 5.1 mmol/L   Chloride 105 98 - 111 mmol/L   CO2 20 (L) 22 - 32 mmol/L   Glucose, Bld 95 70 - 99 mg/dL   BUN 6 6 - 20  mg/dL   Creatinine, Ser 6.14 0.44 - 1.00 mg/dL   Calcium 8.9 8.9 - 70.9 mg/dL   Total Protein 6.3 (L) 6.5 - 8.1 g/dL   Albumin 2.8 (L) 3.5 - 5.0 g/dL   AST 24 15 - 41 U/L   ALT 16 0 - 44 U/L   Alkaline Phosphatase 77 38 - 126 U/L   Total Bilirubin 0.4 0.3 - 1.2 mg/dL   GFR, Estimated >29 >57 mL/min   Anion gap 9 5 - 15   Meds ordered this encounter  Medications   loperamide (IMODIUM) capsule 2 mg   promethazine (PHENERGAN) 25 mg in lactated ringers 1,000 mL infusion   lactated ringers infusion   diphenhydrAMINE (BENADRYL) injection 25 mg   dexamethasone (DECADRON) injection 10 mg   labetalol (NORMODYNE) tablet 200 mg    Missed evening home dose   busPIRone (BUSPAR) tablet 5 mg    Patient missed evening dose, typically takes at 2330   loperamide (IMODIUM) 2 MG capsule    Sig: Take 1 capsule (2 mg total) by mouth 4 (four) times daily as needed for up to 2 days for diarrhea or loose stools.    Dispense:  8 capsule    Refill:  0    Order Specific Question:   Supervising Provider    Answer:   CONSTANT, PEGGY [[id number]]     Assessment and Plan  --26 y.o. [redacted]w[redacted]d at [redacted]w[redacted]d  --Reactive tracing --CHTN on Labetalol,PEC labs WNL --Recurrent GI symptoms, advised premedicating, bland diet x 24 hours minimum --No episodes of diarrhea or vomiting during MAU encounter --Headache improved to 2/10 prior to discharge --Discharge home in stable condition  Calvert Cantor, MSA, MSN, CNM 02/17/2022, 3:12 AM

## 2022-02-17 NOTE — MAU Note (Signed)
.  Marcia Gonzalez is a 26 y.o. at [redacted]w[redacted]d here in MAU reporting: lower back pain since 0800, started having diarrhea about at least 20 times, HA and stomach cramps, and vomiting. Pt stated she checked her BP 141/88. Pt took Tylenol 650 mg with little to no relief. Pt is also reporting tightness and cramping in her ABD that is intermittent. Pt denies VB, LOF, abnormal discharge, vision change, and epigastric pain. Pt denies exposure to illness CHTN on labetalol 200mg  bid, took today  Onset of complaint: 0800 Pain score: 0/10 ABD, 8/10 HA, 0/10 back Vitals:   02/17/22 0015  BP: 131/74  Pulse: 89  Resp: 18  Temp: 97.9 F (36.6 C)  SpO2: 97%     FHT:147 Lab orders placed from triage:  UA

## 2022-02-17 NOTE — Discharge Instructions (Signed)
Vomiting in Pregnancy Follow these instructions at home: To help relieve your symptoms, listen to your body. Everyone is different and has different preferences. Find what works best for you. Here are some things you can try to help relieve your symptoms: Meals and snacks Eat 5-6 small meals daily instead of 3 large meals. Eating small meals and snacks can help you avoid an empty stomach. Before getting out of bed, eat a couple of crackers to avoid moving around on an empty stomach. Eat a protein-rich snack before bed. Examples include cheese and crackers, or a peanut butter sandwich made with 1 slice of whole-wheat bread and 1 tsp (5 g) of peanut butter. Eat and drink slowly. Try eating starchy foods as these are usually tolerated well. Examples include cereal, toast, bread, potatoes, pasta, rice, and pretzels. Eat at least one serving of protein with your meals and snacks. Protein options include lean meats, poultry, seafood, beans, nuts, nut butters, eggs, cheese, and yogurt. Eat or suck on things that have ginger in them. It may help to relieve nausea. Add  tsp (0.44 g) ground ginger to hot tea, or choose ginger tea.   Fluids It is important to stay hydrated. Try to: Drink small amounts of fluids often. Drink fluids 30 minutes before or after a meal to help lessen the feeling of a full stomach. Drink 100% fruit juice or an electrolyte drink. An electrolyte drink contains sodium, potassium, and chloride. Drink fluids that are cold, clear, and carbonated or sour. These include lemonade, ginger ale, lemon-lime soda, ice water, and sparkling water. Things to avoid Avoid the following: Eating foods that trigger your symptoms. These may include spicy foods, coffee, high-fat foods, very sweet foods, and acidic foods. Drinking more than 1 cup of fluid at a time. Skipping meals. Nausea can be more intense on an empty stomach. If you cannot tolerate food, do not force it. Try sucking on ice chips or  other frozen items and make up for missed calories later. Lying down within 2 hours after eating. Being exposed to environmental triggers. These may include food smells, smoky rooms, closed spaces, rooms with strong smells, warm or humid places, overly loud and noisy rooms, and rooms with motion or flickering lights. Try eating meals in a well-ventilated area that is free of strong smells. Making quick and sudden changes in your movement. Taking iron pills and multivitamins that contain iron. If you take prescription iron pills, do not stop taking them unless your health care provider approves. Preparing food. The smell of food can spoil your appetite or trigger nausea. General instructions Brush your teeth or use a mouth rinse after meals. Take over-the-counter and prescription medicines only as told by your health care provider. Follow instructions from your health care provider about eating or drinking restrictions. Talk with your health care provider about starting a supplement of vitamin B6. Continue to take your prenatal vitamins as told by your health care provider. If you are having trouble taking your prenatal vitamins, talk with your health care provider about other options. Keep all follow-up visits. This is important. Follow-up visits include prenatal visits. Contact a health care provider if: You have pain in your abdomen. You have a severe headache. You have vision problems. You are losing weight. You feel weak or dizzy. You cannot eat or drink without vomiting, especially if this goes on for a full day. Get help right away if: You cannot drink fluids without vomiting. You vomit blood. You have constant nausea  and vomiting. You are very weak. You faint. You have a fever and your symptoms suddenly get worse. Summary Making some changes to your eating habits may help relieve nausea and vomiting. This condition may be managed with lifestyle changes and medicines as prescribed  by your health care provider. If medicines do not help relieve nausea and vomiting, you may need to receive fluids through an IV at the hospital. This information is not intended to replace advice given to you by your health care provider. Make sure you discuss any questions you have with your health care provider. Document Revised: 01/29/2020 Document Reviewed: 01/29/2020 Elsevier Patient Education  2021 [employer]  Safe Medications in Pregnancy   Acne: Benzoyl Peroxide Salicylic Acid  Backache/Headache: Tylenol: 2 regular strength every 4 hours OR              2 Extra strength every 6 hours  Colds/Coughs/Allergies: Benadryl (alcohol free) 25 mg every 6 hours as needed Breath right strips Claritin Cepacol throat lozenges Chloraseptic throat spray Cold-Eeze- up to three times per day Cough drops, alcohol free Flonase (by prescription only) Guaifenesin Mucinex Robitussin DM (plain only, alcohol free) Saline nasal spray/drops Sudafed (pseudoephedrine) & Actifed ** use only after [redacted] weeks gestation and if you do not have high blood pressure Tylenol Vicks Vaporub Zinc lozenges Zyrtec   Constipation: Colace Ducolax suppositories Fleet enema Glycerin suppositories Metamucil Milk of magnesia Miralax Senokot Smooth move tea  Diarrhea: Kaopectate Imodium A-D  *NO pepto Bismol  Hemorrhoids: Anusol Anusol HC Preparation H Tucks  Indigestion: Tums Maalox Mylanta Zantac  Pepcid  Insomnia: Benadryl (alcohol free) 25mg  every 6 hours as needed Tylenol PM Unisom, no Gelcaps  Leg Cramps: Tums MagGel  Nausea/Vomiting:  Bonine Dramamine Emetrol Ginger extract Sea bands Meclizine  Nausea medication to take during pregnancy:  Unisom (doxylamine succinate 25 mg tablets) Take one tablet daily at bedtime. If symptoms are not adequately controlled, the dose can be increased to a maximum recommended dose of two tablets daily (1/2 tablet in the morning, 1/2  tablet mid-afternoon and one at bedtime). Vitamin B6 100mg  tablets. Take one tablet twice a day (up to 200 mg per day).  Skin Rashes: Aveeno products Benadryl cream or 25mg  every 6 hours as needed Calamine Lotion 1% cortisone cream  Yeast infection: Gyne-lotrimin 7 Monistat 7   **If taking multiple medications, please check labels to avoid duplicating the same active ingredients **take medication as directed on the label ** Do not exceed 4000 mg of tylenol in 24 hours **Do not take medications that contain aspirin or ibuprofen

## 2022-02-23 ENCOUNTER — Other Ambulatory Visit: Payer: Self-pay | Admitting: Family Medicine

## 2022-02-23 ENCOUNTER — Other Ambulatory Visit: Payer: BC Managed Care – PPO

## 2022-02-23 DIAGNOSIS — F411 Generalized anxiety disorder: Secondary | ICD-10-CM

## 2022-02-23 DIAGNOSIS — F331 Major depressive disorder, recurrent, moderate: Secondary | ICD-10-CM

## 2022-02-24 ENCOUNTER — Encounter: Payer: Self-pay | Admitting: Obstetrics & Gynecology

## 2022-02-25 ENCOUNTER — Other Ambulatory Visit: Payer: Self-pay | Admitting: Obstetrics & Gynecology

## 2022-02-25 DIAGNOSIS — O10919 Unspecified pre-existing hypertension complicating pregnancy, unspecified trimester: Secondary | ICD-10-CM

## 2022-02-26 ENCOUNTER — Encounter: Payer: Self-pay | Admitting: Obstetrics & Gynecology

## 2022-02-26 ENCOUNTER — Ambulatory Visit (INDEPENDENT_AMBULATORY_CARE_PROVIDER_SITE_OTHER): Payer: BC Managed Care – PPO

## 2022-02-26 ENCOUNTER — Ambulatory Visit (INDEPENDENT_AMBULATORY_CARE_PROVIDER_SITE_OTHER): Payer: BC Managed Care – PPO | Admitting: Obstetrics & Gynecology

## 2022-02-26 VITALS — BP 94/59 | HR 77 | Wt 238.0 lb

## 2022-02-26 DIAGNOSIS — O10919 Unspecified pre-existing hypertension complicating pregnancy, unspecified trimester: Secondary | ICD-10-CM | POA: Diagnosis not present

## 2022-02-26 DIAGNOSIS — O99283 Endocrine, nutritional and metabolic diseases complicating pregnancy, third trimester: Secondary | ICD-10-CM

## 2022-02-26 DIAGNOSIS — O0992 Supervision of high risk pregnancy, unspecified, second trimester: Secondary | ICD-10-CM

## 2022-02-26 DIAGNOSIS — O0993 Supervision of high risk pregnancy, unspecified, third trimester: Secondary | ICD-10-CM

## 2022-02-26 DIAGNOSIS — E039 Hypothyroidism, unspecified: Secondary | ICD-10-CM

## 2022-02-26 DIAGNOSIS — Z3A32 32 weeks gestation of pregnancy: Secondary | ICD-10-CM | POA: Diagnosis not present

## 2022-02-26 DIAGNOSIS — K219 Gastro-esophageal reflux disease without esophagitis: Secondary | ICD-10-CM

## 2022-02-26 DIAGNOSIS — F418 Other specified anxiety disorders: Secondary | ICD-10-CM

## 2022-02-26 DIAGNOSIS — O219 Vomiting of pregnancy, unspecified: Secondary | ICD-10-CM

## 2022-02-26 LAB — POCT URINALYSIS DIPSTICK OB
Blood, UA: NEGATIVE
Glucose, UA: NEGATIVE
Ketones, UA: NEGATIVE
Leukocytes, UA: NEGATIVE
Nitrite, UA: NEGATIVE
POC,PROTEIN,UA: NEGATIVE

## 2022-02-26 MED ORDER — DOXYLAMINE-PYRIDOXINE 10-10 MG PO TBEC: 1.0000 | DELAYED_RELEASE_TABLET | Freq: Every evening | ORAL | 3 refills | Status: DC

## 2022-02-26 MED ORDER — OMEPRAZOLE MAGNESIUM 20 MG PO TBEC: 20.0000 mg | DELAYED_RELEASE_TABLET | Freq: Every day | ORAL | 6 refills | Status: DC

## 2022-02-26 NOTE — Progress Notes (Signed)
HIGH-RISK PREGNANCY VISIT Patient name: Marcia Gonzalez MRN [id number]  Date of birth: 05-Sep-1995 Chief Complaint:   High Risk Gestation ([place] today; vomiting since last night; + back pain; legs hurt and get numb)  History of Present Illness:   Marcia Gonzalez is a 26 y.o. G68P0020 female at [redacted]w[redacted]d with an Estimated Date of Delivery: 04/22/22 being seen today for ongoing management of a high-risk pregnancy complicated by:    Chronic HTN- Bps at home 120-130s, currently asymptomatic Hypothyroid- TSH stable  Today she reports: -Acid reflux- minimal improvement with pepcid and tums -vomiting- up all last night due to vomiting.  Taking zofran, doesn't seem to help -Vaginal discharge: On occasion clear discharge, underwear is wet.  Denies itching/odor.  Occasional Braxton-Hicks.  Contractions: Irritability. Vag. Bleeding: None.  Movement: Present. denies leaking of fluid.        01/28/2022   10:12 AM 10/21/2021    2:55 PM 09/19/2021    2:39 PM 08/13/2021    3:46 PM 06/30/2021    3:19 PM  Depression screen PHQ 2/9  Decreased Interest 1 1 1 1 1   Down, Depressed, Hopeless 1 1 1 1 2   PHQ - 2 Score 2 2 2 2 3   Altered sleeping 3 3 0 1 2  Tired, decreased energy 3 3 0 1 2  Change in appetite 3 3 0 1 1  Feeling bad or failure about yourself  2 0 0 1 1  Trouble concentrating 1 0 0 1 0  Moving slowly or fidgety/restless 0 0 0 1 1  Suicidal thoughts 0 0 0 0 0  PHQ-9 Score 14 11 2 8 10   Difficult doing work/chores   Not difficult at all Somewhat difficult Very difficult     Current Outpatient Medications  Medication Instructions   albuterol (ACCUNEB) 0.63 MG/3ML nebulizer solution 1 ampule, Nebulization, Every 6 hours PRN   aspirin EC 81 mg, Oral, Daily, Swallow whole.   Blood Pressure Monitor MISC For regular home bp monitoring during pregnancy   buPROPion (WELLBUTRIN XL) 150 mg, Oral, Daily   busPIRone (BUSPAR) 5 mg, Oral, 3 times daily   cyclobenzaprine (FLEXERIL) 10 mg, Oral, 3 times daily  PRN   docusate sodium (COLACE) 100 mg, Oral, 2 times daily PRN   Doxylamine-Pyridoxine 10-10 MG TBEC 1 tablet, Oral, Nightly   escitalopram (LEXAPRO) 20 mg, Oral, Daily, (NEEDS TO BE SEEN BEFORE NEXT REFILL)   famotidine (PEPCID) 20 mg, Oral, 2 times daily   ferrous sulfate 325 mg, Oral, Daily with breakfast   labetalol (NORMODYNE) 200 mg, Oral, 2 times daily   levothyroxine (SYNTHROID) 88 mcg, Oral, Daily before breakfast   omeprazole (PRILOSEC OTC) 20 mg, Oral, Daily   ondansetron (ZOFRAN-ODT) 4 mg, Oral, Every 8 hours PRN   Prenatal Vit-Fe Fumarate-FA (PRENATAL MULTIVITAMIN) TABS tablet 1 tablet, Oral, Daily   promethazine (PHENERGAN) 12.5 mg, Oral, Every 6 hours PRN     Review of Systems:   Pertinent items are noted in HPI Denies headaches, visual changes, shortness of breath, chest pain, abdominal pain, severe nausea/vomiting, or problems with urination or bowel movements unless otherwise stated above. Pertinent History Reviewed:  Reviewed past medical,surgical, social, obstetrical and family history.  Reviewed problem list, medications and allergies. Physical Assessment:   Vitals:   02/26/22 1051  BP: (!) 94/59  Pulse: 77  Weight: 238 lb (108 kg)  Body mass index is 39.61 kg/m.           Physical Examination:   General appearance: appears  fatigued  Mental status: normal mood, behavior, speech, dress, motor activity, and thought processes  Skin: warm & dry   Extremities: Edema: None    Cardiovascular: normal heart rate noted  Respiratory: normal respiratory effort, no distress  Abdomen: gravid, soft, non-tender  Pelvic:  normal external genitalia, pink vaginal mucosa, minimal clear to white discharge noted.  Cervix visualized: closed, negative pooling          Fern negative  Fetal Status:     Movement: Present    Fetal Surveillance Testing today: [place] 32+1 wks,cephalic,BPP 8/8,FHR 137 bpm,AFI 16.7 cm,RI .67,.68,.72,.68=83%,EFW 2191 g 79%,posterior placenta gr 2    Chaperone: Malachy Mood    Results for orders placed or performed in visit on 02/26/22 (from the past 24 hour(s))  POC Urinalysis Dipstick OB   Collection Time: 02/26/22 10:46 AM  Result Value Ref Range   Color, UA     Clarity, UA     Glucose, UA Negative Negative   Bilirubin, UA     Ketones, UA neg    Spec Grav, UA     Blood, UA neg    pH, UA     POC,PROTEIN,UA Negative Negative, Trace, Small (1+), Moderate (2+), Large (3+), 4+   Urobilinogen, UA     Nitrite, UA neg    Leukocytes, UA Negative Negative   Appearance     Odor       Assessment & Plan:  High-risk pregnancy: G3P0020 at [redacted]w[redacted]d with an Estimated Date of Delivery: 04/22/22   1) Chronic HTN- continue Lab 200mg  bid Continue antepartum testing and serial growth   2) Nausea/vomiting/acid reflux -reviewed conservative management -diclegis and prilosec sent in  3) Anxiety- continue with current medication 4) Hypothyroidism- no change to meds  -r/o for rupture, reviewed precautions  Meds:  Meds ordered this encounter  Medications   omeprazole (PRILOSEC OTC) 20 MG tablet    Sig: Take 1 tablet (20 mg total) by mouth daily.    Dispense:  30 tablet    Refill:  6   Doxylamine-Pyridoxine 10-10 MG TBEC    Sig: Take 1 tablet by mouth at bedtime.    Dispense:  60 tablet    Refill:  3    Labs/procedures today: fern test negative  Treatment Plan:  as outlined above  Reviewed: Preterm labor symptoms and general obstetric precautions including but not limited to vaginal bleeding, contractions, leaking of fluid and fetal movement were reviewed in detail with the patient.  All questions were answered. PT has home bp cuff. Check bp weekly, let know if >140/90.   Follow-up: Return for as scheduled weekly.   Future Appointments  Date Time Provider Department Center  03/02/2022 10:30 AM [facility] NURSE [facility]  03/05/2022  8:30 AM [facility] 03/07/2022 [facility] None  03/05/2022  9:30 AM 03/07/2022,  CNM [facility]  03/09/2022  9:50 AM [facility] NURSE [facility]  03/12/2022 10:45 AM [facility] 03/14/2022 [facility] None  03/12/2022 11:30 AM 03/14/2022, DO [facility]  03/16/2022  9:10 AM [facility] NURSE [facility]  03/19/2022  9:50 AM [facility] NURSE [facility]  03/19/2022 10:30 AM 03/21/2022, DO [facility]  03/26/2022 10:00 AM [facility] 05/26/2022 [facility] None  03/26/2022 10:50 AM 05/26/2022, DO [facility]  03/30/2022 10:10 AM [facility] NURSE [facility]  04/02/2022 10:30 AM [facility] 04/04/2022 [facility] None  04/02/2022 11:30 AM 04/04/2022, DO [facility]  04/06/2022 10:10 AM [facility] NURSE [facility]  04/09/2022  8:30 AM [facility] [place] [facility] None  04/09/2022 10:10 AM Eure, Mertie Clause, MD [facility]    Orders Placed This Encounter  Procedures   POC Urinalysis Dipstick OB    Janyth Pupa, DO Attending [facility] for [employer], [place]

## 2022-02-26 NOTE — Progress Notes (Signed)
[place] 32+1 wks,cephalic,BPP 8/8,FHR 137 bpm,AFI 16.7 cm,RI .67,.68,.72,.68=83%,EFW 2191 g 79%,posterior placenta gr 2

## 2022-03-02 ENCOUNTER — Other Ambulatory Visit: Payer: BC Managed Care – PPO

## 2022-03-02 ENCOUNTER — Other Ambulatory Visit: Payer: Self-pay | Admitting: Advanced Practice Midwife

## 2022-03-02 MED ORDER — OMEPRAZOLE 40 MG PO CPDR
40.0000 mg | DELAYED_RELEASE_CAPSULE | Freq: Every day | ORAL | 3 refills | Status: DC
Start: 2022-03-02 — End: 2022-04-06

## 2022-03-04 ENCOUNTER — Other Ambulatory Visit: Payer: Self-pay | Admitting: Advanced Practice Midwife

## 2022-03-04 DIAGNOSIS — O10919 Unspecified pre-existing hypertension complicating pregnancy, unspecified trimester: Secondary | ICD-10-CM

## 2022-03-05 ENCOUNTER — Ambulatory Visit (INDEPENDENT_AMBULATORY_CARE_PROVIDER_SITE_OTHER): Payer: BC Managed Care – PPO | Admitting: Advanced Practice Midwife

## 2022-03-05 ENCOUNTER — Ambulatory Visit (INDEPENDENT_AMBULATORY_CARE_PROVIDER_SITE_OTHER): Payer: BC Managed Care – PPO

## 2022-03-05 VITALS — BP 108/69 | HR 87 | Wt 238.0 lb

## 2022-03-05 DIAGNOSIS — O10919 Unspecified pre-existing hypertension complicating pregnancy, unspecified trimester: Secondary | ICD-10-CM

## 2022-03-05 DIAGNOSIS — Z3A33 33 weeks gestation of pregnancy: Secondary | ICD-10-CM

## 2022-03-05 DIAGNOSIS — O0992 Supervision of high risk pregnancy, unspecified, second trimester: Secondary | ICD-10-CM

## 2022-03-05 LAB — POCT URINALYSIS DIPSTICK OB
Blood, UA: NEGATIVE
Glucose, UA: NEGATIVE
Ketones, UA: NEGATIVE
Leukocytes, UA: NEGATIVE
Nitrite, UA: NEGATIVE

## 2022-03-05 NOTE — Progress Notes (Signed)
HIGH-RISK PREGNANCY VISIT Patient name: Marcia Gonzalez MRN [id number]  Date of birth: [birth year] Chief Complaint:   Routine Prenatal Visit (BPP)  History of Present Illness:   Marcia Gonzalez is a 26 y.o. G37P0020 female at [redacted]w[redacted]d with an Estimated Date of Delivery: 04/22/22 being seen today for ongoing management of a high-risk pregnancy complicated by chronic hypertension currently on labetalol 200mg  BID.    Today she reports no complaints. Contractions: Irritability. Vag. Bleeding: None.  Movement: Present. denies leaking of fluid.      01/28/2022   10:12 AM 10/21/2021    2:55 PM 09/19/2021    2:39 PM 08/13/2021    3:46 PM 06/30/2021    3:19 PM  Depression screen PHQ 2/9  Decreased Interest 1 1 1 1 1   Down, Depressed, Hopeless 1 1 1 1 2   PHQ - 2 Score 2 2 2 2 3   Altered sleeping 3 3 0 1 2  Tired, decreased energy 3 3 0 1 2  Change in appetite 3 3 0 1 1  Feeling bad or failure about yourself  2 0 0 1 1  Trouble concentrating 1 0 0 1 0  Moving slowly or fidgety/restless 0 0 0 1 1  Suicidal thoughts 0 0 0 0 0  PHQ-9 Score 14 11 2 8 10   Difficult doing work/chores   Not difficult at all Somewhat difficult Very difficult        01/28/2022   10:12 AM 10/21/2021    3:26 PM 09/19/2021    2:40 PM 08/13/2021    3:47 PM  GAD 7 : Generalized Anxiety Score  Nervous, Anxious, on Edge 2 0 2 1  Control/stop worrying 1 1 3  0  Worry too much - different things 3 1 3  0  Trouble relaxing 2 0 3 1  Restless 0 0 2 0  Easily annoyed or irritable 3 3 3 1   Afraid - awful might happen 1 3 3 1   Total GAD 7 Score 12 8 19 4   Anxiety Difficulty   Extremely difficult Somewhat difficult     Review of Systems:   Pertinent items are noted in HPI Denies abnormal vaginal discharge w/ itching/odor/irritation, headaches, visual changes, shortness of breath, chest pain, abdominal pain, severe nausea/vomiting, or problems with urination or bowel movements unless otherwise stated above. Pertinent History  Reviewed:  Reviewed past medical,surgical, social, obstetrical and family history.  Reviewed problem list, medications and allergies. Physical Assessment:   Vitals:   03/05/22 0919  BP: 108/69  Pulse: 87  Weight: 238 lb (108 kg)  Body mass index is 39.61 kg/m.           Physical Examination:   General appearance: alert, well appearing, and in no distress  Mental status: alert, oriented to person, place, and time  Skin: warm & dry   Extremities: Edema: None    Cardiovascular: normal heart rate noted  Respiratory: normal respiratory effort, no distress  Abdomen: gravid, soft, non-tender  Pelvic: Cervical exam deferred         Fetal Status:     Movement: Present    Fetal Surveillance Testing today: 03/31/2022 33+1 wks,cephalic,BPP 8/8,elevated umbilical artery dopplers with EDF,RI .75,.65,.75,.74=95%,AFI 13.82 cm,FHR 154 bpm   Chaperone: N/A    Results for orders placed or performed in visit on 03/05/22 (from the past 24 hour(s))  POC Urinalysis Dipstick OB   Collection Time: 03/05/22  9:10 AM  Result Value Ref Range   Color, UA     Clarity,  UA     Glucose, UA Negative Negative   Bilirubin, UA     Ketones, UA neg    Spec Grav, UA     Blood, UA neg    pH, UA     POC,PROTEIN,UA Trace Negative, Trace, Small (1+), Moderate (2+), Large (3+), 4+   Urobilinogen, UA     Nitrite, UA neg    Leukocytes, UA Negative Negative   Appearance     Odor      Assessment & Plan:  High-risk pregnancy: G3P0020 at [redacted]w[redacted]d with an Estimated Date of Delivery: 04/22/22      ICD-10-CM   1. Encounter for supervision of high risk pregnancy in second trimester, antepartum  O09.92 POC Urinalysis Dipstick OB    2. Chronic hypertension affecting pregnancy  O10.919 POC Urinalysis Dipstick OB    3. [redacted] weeks gestation of pregnancy  Z3A.33 POC Urinalysis Dipstick OB        Meds: No orders of the defined types were placed in this encounter.   Orders:  Orders Placed This Encounter  Procedures   POC  Urinalysis Dipstick OB     Labs/procedures today: BPP, Dopplers   Reviewed: Preterm labor symptoms and general obstetric precautions including but not limited to vaginal bleeding, contractions, leaking of fluid and fetal movement were reviewed in detail with the patient.  All questions were answered. Does have home bp cuff. Office bp cuff given: not applicable. Check bp daily, let us know if consistently >150 and/or >95.  Follow-up: No follow-ups on file.   Future Appointments  Date Time Provider Department Center  03/09/2022  9:50 AM [facility] NURSE [facility]  03/12/2022 10:45 AM [facility] [place] [facility] None  03/12/2022 11:30 AM Myna Hidalgo, DO [facility]  03/16/2022  9:10 AM [facility] NURSE [facility]  03/19/2022  9:50 AM [facility] NURSE [facility]  03/19/2022 10:30 AM Myna Hidalgo, DO [facility]  03/26/2022 10:00 AM [facility] [place] [facility] None  03/26/2022 10:50 AM Myna Hidalgo, DO [facility]  03/30/2022 10:10 AM [facility] N[facility]  04/02/2022 10:30 AM [facility] [place] [facility] None  04/02/2022 11:30 AM Myna Hidalgo, DO [facility]  04/06/2022 10:10 AM [facility]  04/09/2022  8:30 AM [facility] [place] [facility] None  04/09/2022 10:10 AM Eure, Amaryllis Dyke, MD [facility]    Orders Placed This Encounter  Procedures   POC Urinalysis Dipstick OB   Jacklyn Shell , DNP, CNM [facility] 03/05/2022 9:31 AM

## 2022-03-05 NOTE — Progress Notes (Signed)
[place] 33+1 wks,cephalic,BPP 8/8,elevated umbilical artery dopplers with EDF,RI .75,.65,.75,.74=95%,AFI 13.82 cm,FHR 154 bpm

## 2022-03-09 ENCOUNTER — Ambulatory Visit (INDEPENDENT_AMBULATORY_CARE_PROVIDER_SITE_OTHER): Payer: BC Managed Care – PPO | Admitting: *Deleted

## 2022-03-09 ENCOUNTER — Other Ambulatory Visit: Payer: BC Managed Care – PPO

## 2022-03-09 VITALS — BP 90/57 | HR 106 | Wt 237.3 lb

## 2022-03-09 DIAGNOSIS — Z3A33 33 weeks gestation of pregnancy: Secondary | ICD-10-CM | POA: Diagnosis not present

## 2022-03-09 DIAGNOSIS — O0992 Supervision of high risk pregnancy, unspecified, second trimester: Secondary | ICD-10-CM | POA: Diagnosis not present

## 2022-03-09 DIAGNOSIS — O10919 Unspecified pre-existing hypertension complicating pregnancy, unspecified trimester: Secondary | ICD-10-CM

## 2022-03-09 DIAGNOSIS — Z1389 Encounter for screening for other disorder: Secondary | ICD-10-CM

## 2022-03-09 DIAGNOSIS — Z331 Pregnant state, incidental: Secondary | ICD-10-CM

## 2022-03-09 DIAGNOSIS — O288 Other abnormal findings on antenatal screening of mother: Secondary | ICD-10-CM

## 2022-03-09 LAB — POCT URINALYSIS DIPSTICK OB
Blood, UA: NEGATIVE
Glucose, UA: NEGATIVE
Ketones, UA: NEGATIVE
Leukocytes, UA: NEGATIVE
Nitrite, UA: NEGATIVE

## 2022-03-09 NOTE — Progress Notes (Signed)
   NURSE VISIT- NST  SUBJECTIVE:  Marcia Gonzalez is a 26 y.o. G35P0020 female at [redacted]w[redacted]d, here for a NST for pregnancy complicated by [facility].  She reports active fetal movement, contractions: none, vaginal bleeding: none, membranes: intact.   OBJECTIVE:  BP (!) 90/57   Pulse (!) 106   Wt 237 lb 4.8 oz (107.6 kg)   LMP 07/22/2021 (Exact Date)   BMI 39.49 kg/m   Appears well, no apparent distress  Results for orders placed or performed in visit on 03/09/22 (from the past 24 hour(s))  POC Urinalysis Dipstick OB   Collection Time: 03/09/22  3:56 PM  Result Value Ref Range   Color, UA     Clarity, UA     Glucose, UA Negative Negative   Bilirubin, UA     Ketones, UA neg    Spec Grav, UA     Blood, UA neg    pH, UA     POC,PROTEIN,UA Trace Negative, Trace, Small (1+), Moderate (2+), Large (3+), 4+   Urobilinogen, UA     Nitrite, UA neg    Leukocytes, UA Negative Negative   Appearance     Odor      NST: FHR baseline 140 bpm, Variability: moderate, Accelerations:present, Decelerations:  Absent= Cat 1/reactive Toco: none   ASSESSMENT: G3P0020 at [redacted]w[redacted]d with CHTN NST reactive  PLAN: EFM strip reviewed by Joellyn Haff, CNM, [facility]   Recommendations: keep next appointment as scheduled    Jobe Marker  03/09/2022 5:13 PM

## 2022-03-10 ENCOUNTER — Telehealth: Payer: Self-pay | Admitting: *Deleted

## 2022-03-10 ENCOUNTER — Encounter: Payer: Self-pay | Admitting: Women's Health

## 2022-03-10 ENCOUNTER — Other Ambulatory Visit (HOSPITAL_COMMUNITY)
Admission: RE | Admit: 2022-03-10 | Discharge: 2022-03-10 | Disposition: A | Discharge status: Home / Self Care | Payer: BC Managed Care – PPO | Source: Ambulatory Visit | Attending: Obstetrics & Gynecology | Admitting: Obstetrics & Gynecology

## 2022-03-10 ENCOUNTER — Ambulatory Visit (INDEPENDENT_AMBULATORY_CARE_PROVIDER_SITE_OTHER): Payer: BC Managed Care – PPO | Admitting: Women's Health

## 2022-03-10 VITALS — BP 104/67 | HR 93 | Wt 237.0 lb

## 2022-03-10 DIAGNOSIS — R109 Unspecified abdominal pain: Secondary | ICD-10-CM | POA: Insufficient documentation

## 2022-03-10 DIAGNOSIS — O0993 Supervision of high risk pregnancy, unspecified, third trimester: Secondary | ICD-10-CM | POA: Insufficient documentation

## 2022-03-10 DIAGNOSIS — O10919 Unspecified pre-existing hypertension complicating pregnancy, unspecified trimester: Secondary | ICD-10-CM

## 2022-03-10 DIAGNOSIS — O0992 Supervision of high risk pregnancy, unspecified, second trimester: Secondary | ICD-10-CM

## 2022-03-10 DIAGNOSIS — Z3A33 33 weeks gestation of pregnancy: Secondary | ICD-10-CM | POA: Diagnosis not present

## 2022-03-10 DIAGNOSIS — O26899 Other specified pregnancy related conditions, unspecified trimester: Secondary | ICD-10-CM | POA: Diagnosis not present

## 2022-03-10 DIAGNOSIS — Z3A00 Weeks of gestation of pregnancy not specified: Secondary | ICD-10-CM | POA: Insufficient documentation

## 2022-03-10 LAB — POCT URINALYSIS DIPSTICK OB
Blood, UA: NEGATIVE
Glucose, UA: NEGATIVE
Leukocytes, UA: NEGATIVE
Nitrite, UA: NEGATIVE

## 2022-03-10 NOTE — Progress Notes (Signed)
Work-in HIGH-RISK PREGNANCY VISIT Patient name: Marcia Gonzalez MRN [id number]  Date of birth: 1996-02-03 Chief Complaint:   lower abdominal cramps and back pain (Headache and blurred vision)  History of Present Illness:   Marcia Gonzalez is a 26 y.o. G32P0020 female at [redacted]w[redacted]d with an Estimated Date of Delivery: 04/22/22 being seen today for ongoing management of a high-risk pregnancy complicated by chronic hypertension currently on labetalol 200mg  BID and elevated UAD 95%.    Today she reports  constant low back pain and intermittent menstrual type cramping. BP elevated at home at night- 130s/80s. Takes labetalol at 0900 and 2300, checks bp right before she takes labetalol at night so likely am dose just wearing off as bp's here have actually been low. Headache in Lt eye w/ blurred vision today, took apap 1,000mg  and has eased off. . Denies abnormal discharge, itching/odor/irritation.  No UTI sx. Did have some bilateral flank pain yesterday, but has resolved. Feels hot.  Contractions: Irritability. Vag. Bleeding: None.  Movement: Present. denies leaking of fluid.      01/28/2022   10:12 AM 10/21/2021    2:55 PM 09/19/2021    2:39 PM 08/13/2021    3:46 PM 06/30/2021    3:19 PM  Depression screen PHQ 2/9  Decreased Interest 1 1 1 1 1   Down, Depressed, Hopeless 1 1 1 1 2   PHQ - 2 Score 2 2 2 2 3   Altered sleeping 3 3 0 1 2  Tired, decreased energy 3 3 0 1 2  Change in appetite 3 3 0 1 1  Feeling bad or failure about yourself  2 0 0 1 1  Trouble concentrating 1 0 0 1 0  Moving slowly or fidgety/restless 0 0 0 1 1  Suicidal thoughts 0 0 0 0 0  PHQ-9 Score 14 11 2 8 10   Difficult doing work/chores   Not difficult at all Somewhat difficult Very difficult        01/28/2022   10:12 AM 10/21/2021    3:26 PM 09/19/2021    2:40 PM 08/13/2021    3:47 PM  GAD 7 : Generalized Anxiety Score  Nervous, Anxious, on Edge 2 0 2 1  Control/stop worrying 1 1 3  0  Worry too much - different things 3 1 3  0   Trouble relaxing 2 0 3 1  Restless 0 0 2 0  Easily annoyed or irritable 3 3 3 1   Afraid - awful might happen 1 3 3 1   Total GAD 7 Score 12 8 19 4   Anxiety Difficulty   Extremely difficult Somewhat difficult     Review of Systems:   Pertinent items are noted in HPI Denies abnormal vaginal discharge w/ itching/odor/irritation, headaches, visual changes, shortness of breath, chest pain, abdominal pain, severe nausea/vomiting, or problems with urination or bowel movements unless otherwise stated above. Pertinent History Reviewed:  Reviewed past medical,surgical, social, obstetrical and family history.  Reviewed problem list, medications and allergies. Physical Assessment:   Vitals:   03/10/22 1549  BP: 104/67  Pulse: 93  Weight: 237 lb (107.5 kg)  Body mass index is 39.44 kg/m. Temp 98.5           Physical Examination:   General appearance: alert, well appearing, and in no distress  Mental status: alert, oriented to person, place, and time  Skin: warm & dry   Extremities: Edema: Trace    Cardiovascular: normal heart rate noted  Respiratory: normal respiratory effort, no distress  Abdomen: gravid,  soft, non-tender  Pelvic: spec exam: cx appears long/closed, CV swab obtained Cervical exam performed  Dilation: Closed Effacement (%): Thick Station: Ballotable  Fetal Status: Fetal Heart Rate (bpm): 135    Movement: Present    Fetal Surveillance Testing today: NST: FHR baseline 135 bpm, Variability: moderate, Accelerations:present, Decelerations:  Absent= Cat 1/reactive Toco: no uc's, occ irritability    Chaperone: Jobe Marker    Results for orders placed or performed in visit on 03/10/22 (from the past 24 hour(s))  POC Urinalysis Dipstick OB   Collection Time: 03/10/22  3:48 PM  Result Value Ref Range   Color, UA     Clarity, UA     Glucose, UA Negative Negative   Bilirubin, UA     Ketones, UA small    Spec Grav, UA     Blood, UA neg    pH, UA     POC,PROTEIN,UA  Trace Negative, Trace, Small (1+), Moderate (2+), Large (3+), 4+   Urobilinogen, UA     Nitrite, UA neg    Leukocytes, UA Negative Negative   Appearance     Odor      Assessment & Plan:  High-risk pregnancy: G3P0020 at [redacted]w[redacted]d with an Estimated Date of Delivery: 04/22/22   1) CHTN, stable on labetalol 200mg  BID, ASA. Unilateral headache w/ blurred vision earlier, resolved w/ APAP. Reviewed pre-e s/s, reasons to seek care  2) Elevated UAD, 95% w/ good EDF 8/17, has repeat in 2d  3) Cramping> no evidence of ptl, cx LTC, CV swab collected, increase po water intake, rest tonight. Reviewed ptl s/s, reasons to seek care  Meds: No orders of the defined types were placed in this encounter.   Labs/procedures today: spec exam, CV swab, SVE, and NST  Treatment Plan:  as scheduled  Reviewed: Preterm labor symptoms and general obstetric precautions including but not limited to vaginal bleeding, contractions, leaking of fluid and fetal movement were reviewed in detail with the patient.  All questions were answered. Does have home bp cuff. Office bp cuff given: not applicable. Check bp twice daily, let 9/17 know if consistently >140 and/or >90.  Follow-up: Return for As scheduled.   Future Appointments  Date Time Provider Department Center  03/12/2022 10:45 AM [facility] None  03/12/2022 11:30 AM 03/14/2022, DO [facility]  03/16/2022  9:10 AM [facility] NURSE [facility]  03/19/2022  9:50 AM [facility] NURSE [facility]  03/19/2022 10:30 AM 03/21/2022, DO [facility]  03/26/2022 10:00 AM [facility] 05/26/2022 [facility] None  03/26/2022 10:50 AM 05/26/2022, DO [facility]  03/30/2022 10:10 AM [facility] NURSE [facility]  04/02/2022 10:30 AM [facility] 04/04/2022 [facility] None  04/02/2022 11:30 AM 04/04/2022, DO [facility]  04/06/2022 10:10 AM [facility] NURSE [facility]  04/09/2022  8:30 AM [facility] 04/11/2022 [facility] None  04/09/2022 10:10 AM 04/11/2022, MD [facility]    Orders Placed This Encounter  Procedures   POC Urinalysis Dipstick OB   Lazaro Arms CNM, [facility] 03/10/2022 4:53 PM

## 2022-03-10 NOTE — Telephone Encounter (Signed)
Pt woke up with a bad headache behind left eye. Took Tylenol without help. Pt is having blurred vision. BP is 129/80. Pt continues to have back pain. Pt has tried caffeine. Pt states she hasn't felt the baby move all day. Pt was advised to come in for NST and BP check. Pt will come straight in. JSY

## 2022-03-10 NOTE — Patient Instructions (Signed)
Joel, thank you for choosing our office today! We appreciate the opportunity to meet your healthcare needs. You may receive a short survey by mail, e-mail, or through MyChart. If you are happy with your care we would appreciate if you could take just a few minutes to complete the survey questions. We read all of your comments and take your feedback very seriously. Thank you again for choosing our office.  [facility] ([address] [place], [place] [address]) Entrance C, located off of [address] Free 24/7 valet parking   CLASSES: Go to [email or web address] to register for classes (childbirth, breastfeeding, waterbirth, infant CPR, daddy bootcamp, etc.)  Call the office ([phone]) or go to [facility] if: You begin to have strong, frequent contractions Your water breaks.  Sometimes it is a big gush of fluid, sometimes it is just a trickle that keeps getting your panties wet or running down your legs You have vaginal bleeding.  It is normal to have a small amount of spotting if your cervix was checked.  You don't feel your baby moving like normal.  If you don't, get you something to eat and drink and lay down and focus on feeling your baby move.   If your baby is still not moving like normal, you should call the office or go to [facility].  Call the office ([phone]) or go to [facility] for these signs of pre-eclampsia: Severe headache that does not go away with Tylenol Visual changes- seeing spots, double, blurred vision Pain under your right breast or upper abdomen that does not go away with Tums or heartburn medicine Nausea and/or vomiting Severe swelling in your hands, feet, and face   Tdap Vaccine It is recommended that you get the Tdap vaccine during the third trimester of EACH pregnancy to help protect your baby from getting pertussis (whooping cough) 27-36 weeks is the BEST time to do  this so that you can pass the protection on to your baby. During pregnancy is better than after pregnancy, but if you are unable to get it during pregnancy it will be offered at the hospital.  You can get this vaccine with us, at the health department, your family doctor, or some local pharmacies Everyone who will be around your baby should also be up-to-date on their vaccines before the baby comes. Adults (who are not pregnant) only need 1 dose of Tdap during adulthood.   [place] Pediatricians/Family Doctors [facility] ([facility]): [address], [phone]           [facility]: [address], [phone]                [facility] ([facility]): [address], [phone] (call to ask if accepting patients) [facility]: [address], [place], [phone]    [place] Pediatricians/Family Doctors [facility] ([facility]): [address], [phone] [facility]: [address], [phone] [facility]: [address], [phone]  [place] [facility]  [facility] ([facility]): [phone] [facility]: [address], [phone]   [place] Family Doctors [facility]: [address], [phone]  [place] [facility]  [facility]: [address], [phone]  Home Blood Pressure Monitoring for Patients   Your provider has recommended that you check your   blood pressure (BP) at least once a week at home. If you do not have a blood pressure cuff at home, one will be provided for you. Contact your provider if you have not received your monitor within 1 week.   Helpful Tips for Accurate Home Blood Pressure Checks  Don't smoke, exercise, or drink caffeine 30 minutes before checking your BP Use the restroom before checking your BP (a full bladder can raise your  pressure) Relax in a comfortable upright chair Feet on the ground Left arm resting comfortably on a flat surface at the level of your heart Legs uncrossed Back supported Sit quietly and don't talk Place the cuff on your bare arm Adjust snuggly, so that only two fingertips can fit between your skin and the top of the cuff Check 2 readings separated by at least one minute Keep a log of your BP readings For a visual, please reference this diagram: http://ccnc.care/bpdiagram  Provider Name: [facility]     Phone: [phone]  Zone 1: ALL CLEAR  Continue to monitor your symptoms:  BP reading is less than 140 (top number) or less than 90 (bottom number)  No right upper stomach pain No headaches or seeing spots No feeling nauseated or throwing up No swelling in face and hands  Zone 2: CAUTION Call your doctor's office for any of the following:  BP reading is greater than 140 (top number) or greater than 90 (bottom number)  Stomach pain under your ribs in the middle or right side Headaches or seeing spots Feeling nauseated or throwing up Swelling in face and hands  Zone 3: EMERGENCY  Seek immediate medical care if you have any of the following:  BP reading is greater than160 (top number) or greater than 110 (bottom number) Severe headaches not improving with Tylenol Serious difficulty catching your breath Any worsening symptoms from Zone 2  Preterm Labor and Birth Information  The normal length of a pregnancy is 39-41 weeks. Preterm labor is when labor starts before 37 completed weeks of pregnancy. What are the risk factors for preterm labor? Preterm labor is more likely to occur in women who: Have certain infections during pregnancy such as a bladder infection, sexually transmitted infection, or infection inside the uterus (chorioamnionitis). Have a shorter-than-normal cervix. Have gone into preterm labor before. Have had surgery on their cervix. Are younger than age 17  or older than age 35. Are African American. Are pregnant with twins or multiple babies (multiple gestation). Take street drugs or smoke while pregnant. Do not gain enough weight while pregnant. Became pregnant shortly after having been pregnant. What are the symptoms of preterm labor? Symptoms of preterm labor include: Cramps similar to those that can happen during a menstrual period. The cramps may happen with diarrhea. Pain in the abdomen or lower back. Regular uterine contractions that may feel like tightening of the abdomen. A feeling of increased pressure in the pelvis. Increased watery or bloody mucus discharge from the vagina. Water breaking (ruptured amniotic sac). Why is it important to recognize signs of preterm labor? It is important to recognize signs of preterm labor because babies who are born prematurely may not be fully developed. This can put them at an increased risk for: Long-term (chronic) heart and lung problems. Difficulty immediately after birth with regulating body systems, including blood sugar, body temperature, heart rate, and breathing rate. Bleeding in the brain. Cerebral palsy. Learning difficulties. Death. These risks are highest for babies who are born before 34 weeks   of pregnancy. How is preterm labor treated? Treatment depends on the length of your pregnancy, your condition, and the health of your baby. It may involve: Having a stitch (suture) placed in your cervix to prevent your cervix from opening too early (cerclage). Taking or being given medicines, such as: Hormone medicines. These may be given early in pregnancy to help support the pregnancy. Medicine to stop contractions. Medicines to help mature the baby's lungs. These may be prescribed if the risk of delivery is high. Medicines to prevent your baby from developing cerebral palsy. If the labor happens before 34 weeks of pregnancy, you may need to stay in the hospital. What should I do if I  think I am in preterm labor? If you think that you are going into preterm labor, call your health care provider right away. How can I prevent preterm labor in future pregnancies? To increase your chance of having a full-term pregnancy: Do not use any tobacco products, such as cigarettes, chewing tobacco, and e-cigarettes. If you need help quitting, ask your health care provider. Do not use street drugs or medicines that have not been prescribed to you during your pregnancy. Talk with your health care provider before taking any herbal supplements, even if you have been taking them regularly. Make sure you gain a healthy amount of weight during your pregnancy. Watch for infection. If you think that you might have an infection, get it checked right away. Make sure to tell your health care provider if you have gone into preterm labor before. This information is not intended to replace advice given to you by your health care provider. Make sure you discuss any questions you have with your health care provider. Document Revised: 10/28/2018 Document Reviewed: 11/27/2015 Elsevier Patient Education  2020 Elsevier Inc.   

## 2022-03-11 ENCOUNTER — Other Ambulatory Visit: Payer: BC Managed Care – PPO

## 2022-03-11 ENCOUNTER — Encounter: Payer: BC Managed Care – PPO | Admitting: Obstetrics & Gynecology

## 2022-03-11 ENCOUNTER — Other Ambulatory Visit: Payer: Self-pay | Admitting: Advanced Practice Midwife

## 2022-03-11 DIAGNOSIS — O10919 Unspecified pre-existing hypertension complicating pregnancy, unspecified trimester: Secondary | ICD-10-CM

## 2022-03-12 ENCOUNTER — Ambulatory Visit (INDEPENDENT_AMBULATORY_CARE_PROVIDER_SITE_OTHER): Payer: BC Managed Care – PPO | Admitting: Obstetrics & Gynecology

## 2022-03-12 ENCOUNTER — Ambulatory Visit (INDEPENDENT_AMBULATORY_CARE_PROVIDER_SITE_OTHER): Payer: BC Managed Care – PPO

## 2022-03-12 VITALS — BP 95/63 | HR 93 | Wt 236.8 lb

## 2022-03-12 DIAGNOSIS — O10919 Unspecified pre-existing hypertension complicating pregnancy, unspecified trimester: Secondary | ICD-10-CM

## 2022-03-12 DIAGNOSIS — O99283 Endocrine, nutritional and metabolic diseases complicating pregnancy, third trimester: Secondary | ICD-10-CM

## 2022-03-12 DIAGNOSIS — Z3A34 34 weeks gestation of pregnancy: Secondary | ICD-10-CM

## 2022-03-12 DIAGNOSIS — E039 Hypothyroidism, unspecified: Secondary | ICD-10-CM | POA: Diagnosis not present

## 2022-03-12 DIAGNOSIS — O0992 Supervision of high risk pregnancy, unspecified, second trimester: Secondary | ICD-10-CM

## 2022-03-12 DIAGNOSIS — O0993 Supervision of high risk pregnancy, unspecified, third trimester: Secondary | ICD-10-CM

## 2022-03-12 NOTE — Progress Notes (Signed)
HIGH-RISK PREGNANCY VISIT Patient name: Marcia Gonzalez MRN [id number]  Date of birth: [birth year] Chief Complaint:   Routine Prenatal Visit  History of Present Illness:   Marcia Gonzalez is a 26 y.o. G10P0020 female at [redacted]w[redacted]d with an Estimated Date of Delivery: 04/22/22 being seen today for ongoing management of a high-risk pregnancy complicated by:  Chronic HTN- on Labetalol 200mg  bid  Hypothyroid- TSH stable  -Acid reflux- doing a lot better with omeprazole   Today she reports some back and abdominal pain, but nothing regular  Contractions: Irritability. Vag. Bleeding: None.  Movement: Present. denies leaking of fluid.      01/28/2022   10:12 AM 10/21/2021    2:55 PM 09/19/2021    2:39 PM 08/13/2021    3:46 PM 06/30/2021    3:19 PM  Depression screen PHQ 2/9  Decreased Interest 1 1 1 1 1   Down, Depressed, Hopeless 1 1 1 1 2   PHQ - 2 Score 2 2 2 2 3   Altered sleeping 3 3 0 1 2  Tired, decreased energy 3 3 0 1 2  Change in appetite 3 3 0 1 1  Feeling bad or failure about yourself  2 0 0 1 1  Trouble concentrating 1 0 0 1 0  Moving slowly or fidgety/restless 0 0 0 1 1  Suicidal thoughts 0 0 0 0 0  PHQ-9 Score 14 11 2 8 10   Difficult doing work/chores   Not difficult at all Somewhat difficult Very difficult     Current Outpatient Medications  Medication Instructions   albuterol (ACCUNEB) 0.63 MG/3ML nebulizer solution 1 ampule, Every 6 hours PRN   aspirin EC 81 mg, Oral, Daily, Swallow whole.   Blood Pressure Monitor MISC For regular home bp monitoring during pregnancy   buPROPion (WELLBUTRIN XL) 150 mg, Oral, Daily   busPIRone (BUSPAR) 5 mg, Oral, 3 times daily   cyclobenzaprine (FLEXERIL) 10 mg, Oral, 3 times daily PRN   docusate sodium (COLACE) 100 mg, Oral, 2 times daily PRN   Doxylamine-Pyridoxine 10-10 MG TBEC 1 tablet, Oral, Nightly   escitalopram (LEXAPRO) 20 mg, Oral, Daily, (NEEDS TO BE SEEN BEFORE NEXT REFILL)   ferrous sulfate 325 mg, Oral, Daily with breakfast    labetalol (NORMODYNE) 200 mg, Oral, 2 times daily   levothyroxine (SYNTHROID) 88 mcg, Oral, Daily before breakfast   omeprazole (PRILOSEC) 40 mg, Oral, Daily   ondansetron (ZOFRAN-ODT) 4 mg, Oral, Every 8 hours PRN   Prenatal Vit-Fe Fumarate-FA (PRENATAL MULTIVITAMIN) TABS tablet 1 tablet, Oral, Daily   promethazine (PHENERGAN) 12.5 mg, Oral, Every 6 hours PRN     Review of Systems:   Pertinent items are noted in HPI Denies abnormal vaginal discharge w/ itching/odor/irritation, headaches, visual changes, shortness of breath, chest pain, abdominal pain, severe nausea/vomiting, or problems with urination or bowel movements unless otherwise stated above. Pertinent History Reviewed:  Reviewed past medical,surgical, social, obstetrical and family history.  Reviewed problem list, medications and allergies. Physical Assessment:   Vitals:   03/12/22 1128  BP: 95/63  Pulse: 93  Weight: 236 lb 12.8 oz (107.4 kg)  Body mass index is 39.41 kg/m.           Physical Examination:   General appearance: alert, well appearing, and in no distress  Mental status: normal mood, behavior, speech, dress, motor activity, and thought processes  Skin: warm & dry   Extremities: Edema: Trace    Cardiovascular: normal heart rate noted  Respiratory: normal respiratory effort, no distress  Abdomen: gravid, soft, non-tender  Pelvic: Cervical exam deferred         Fetal Status:     Movement: Present    Fetal Surveillance Testing today: [place] 34+1 wks,cephalic,BPP 8/8,FHR 146 bpm,posterior placenta gr 2,AFI 13.7 cm,RI .63..64..71=77%   Chaperone: N/A    No results found for this or any previous visit (from the past 24 hour(s)).   Assessment & Plan:  High-risk pregnancy: G3P0020 at [redacted]w[redacted]d with an Estimated Date of Delivery: 04/22/22   1) Chronic HTN -BP appropriate, continue current meds -continue twice weekly antepartum testing BPP 8/8 today -IOL 37-39wk  2) Anxiety -doing well with current meds  3)  Acid reflux -much improved, continue current meds  Meds: No orders of the defined types were placed in this encounter.   Labs/procedures today: BPP  Treatment Plan:  as outlined above  Reviewed: Preterm labor symptoms and general obstetric precautions including but not limited to vaginal bleeding, contractions, leaking of fluid and fetal movement were reviewed in detail with the patient.  All questions were answered. Pt has home bp cuff. Check bp weekly, let us know if >140/90.   Follow-up: Return for as scheduled.   Future Appointments  Date Time Provider Department Center  03/16/2022  9:10 AM [facility] NURSE [facility]  03/19/2022  9:50 AM [facility]  03/19/2022 10:30 AM Myna Hidalgo, DO [facility]  03/26/2022 10:00 AM [facility] [place] [facility] None  03/26/2022 10:50 AM Myna Hidalgo, DO [facility]  03/30/2022 10:10 AM [facility]  04/02/2022 10:30 AM [facility] [place] [facility] None  04/02/2022 11:30 AM Myna Hidalgo, DO [facility]  04/06/2022 10:10 AM [facility] NURSE [facility]  04/09/2022  8:30 AM [facility] [place] [facility] None  04/09/2022 10:10 AM Eure, Amaryllis Dyke, MD [facility]    Orders Placed This Encounter  Procedures   TSH    Myna Hidalgo, DO Attending Obstetrician & Gynecologist, [facility], [facility]

## 2022-03-12 NOTE — Progress Notes (Signed)
[place] 34+1 wks,cephalic,BPP 8/8,FHR 146 bpm,posterior placenta gr 2,AFI 13.7 cm,RI .63..64..71=77%

## 2022-03-13 ENCOUNTER — Inpatient Hospital Stay (HOSPITAL_COMMUNITY)
Admission: AD | Admit: 2022-03-13 | Discharge: 2022-03-13 | Disposition: A | Discharge status: Home / Self Care | Payer: BC Managed Care – PPO | Attending: Family Medicine | Admitting: Family Medicine

## 2022-03-13 ENCOUNTER — Encounter (HOSPITAL_COMMUNITY): Payer: Self-pay | Admitting: Family Medicine

## 2022-03-13 DIAGNOSIS — Z3A34 34 weeks gestation of pregnancy: Secondary | ICD-10-CM | POA: Insufficient documentation

## 2022-03-13 DIAGNOSIS — Z3689 Encounter for other specified antenatal screening: Secondary | ICD-10-CM

## 2022-03-13 DIAGNOSIS — Z0371 Encounter for suspected problem with amniotic cavity and membrane ruled out: Secondary | ICD-10-CM | POA: Diagnosis not present

## 2022-03-13 DIAGNOSIS — O0992 Supervision of high risk pregnancy, unspecified, second trimester: Secondary | ICD-10-CM

## 2022-03-13 DIAGNOSIS — O10919 Unspecified pre-existing hypertension complicating pregnancy, unspecified trimester: Secondary | ICD-10-CM

## 2022-03-13 LAB — TSH: TSH: 2.47 u[IU]/mL (ref 0.450–4.500)

## 2022-03-13 NOTE — MAU Note (Signed)
Pt says at 9pm- while in shower- she felt a gush from her vag- after shower - felt wetness No fluid now . Has  had cramping this week- went to [facility] on Wed- thought she was SROM- but they did pelvic exam- not SROM VE- closed - long .

## 2022-03-13 NOTE — MAU Provider Note (Signed)
Event Date/Time   First Provider Initiated Contact with Patient 03/13/22 2218       S: Ms. Marcia Gonzalez is a 26 y.o. G3P0020 at [redacted]w[redacted]d  who presents to MAU today complaining of leaking of fluid since 2100. She states she had recently had intercourse and was taking a shower when she noticed a gush of fluid.  She states she has not noted anymore fluid since that time.  Patient denies vaginal bleeding. She denies contractions, but reports some cramping. She reports normal fetal movement.    O: BP 126/69 (BP Location: Right Arm)   Pulse 81   Temp 97.6 F (36.4 C) (Oral)   Resp 20   Ht 5\' 5"  (1.651 m)   Wt 108.2 kg   LMP 07/22/2021 (Exact Date)   BMI 39.69 kg/m  GENERAL: Well-developed, well-nourished female in no acute distress.  HEAD: Normocephalic, atraumatic.  CHEST: Normal effort of breathing, regular heart rate ABDOMEN: Soft, nontender, gravid PELVIC: Normal external female genitalia. Vagina is pink and rugated. Cervix with normal contour, no lesions. Scant amt white discharge.  Negative pooling. Fern Collected  Cervical exam: Deferred     Fetal Monitoring: FHT: 145 bpm, Mod Var, -Decels, +Accels Toco: One ctx graphed  No results found for this or any previous visit (from the past 24 hour(s)).   A: SIUP at [redacted]w[redacted]d  Membranes intact Cat I FT  P: Fern Negative Discharge to home with precautions.   [redacted]w[redacted]d, CNM 03/13/2022 10:19 PM

## 2022-03-16 ENCOUNTER — Other Ambulatory Visit: Payer: BC Managed Care – PPO

## 2022-03-19 ENCOUNTER — Other Ambulatory Visit: Payer: BC Managed Care – PPO

## 2022-03-19 ENCOUNTER — Inpatient Hospital Stay (HOSPITAL_BASED_OUTPATIENT_CLINIC_OR_DEPARTMENT_OTHER): Payer: BC Managed Care – PPO

## 2022-03-19 ENCOUNTER — Encounter: Payer: Self-pay | Admitting: Obstetrics & Gynecology

## 2022-03-19 ENCOUNTER — Encounter (HOSPITAL_COMMUNITY): Payer: Self-pay | Admitting: Obstetrics & Gynecology

## 2022-03-19 ENCOUNTER — Ambulatory Visit (INDEPENDENT_AMBULATORY_CARE_PROVIDER_SITE_OTHER): Payer: BC Managed Care – PPO | Admitting: Obstetrics & Gynecology

## 2022-03-19 ENCOUNTER — Inpatient Hospital Stay (HOSPITAL_COMMUNITY)
Admission: AD | Admit: 2022-03-19 | Discharge: 2022-03-19 | Disposition: A | Discharge status: Home / Self Care | Payer: BC Managed Care – PPO | Attending: Obstetrics & Gynecology | Admitting: Obstetrics & Gynecology

## 2022-03-19 ENCOUNTER — Encounter: Payer: BC Managed Care – PPO | Admitting: Obstetrics & Gynecology

## 2022-03-19 VITALS — BP 112/64 | HR 102 | Wt 237.0 lb

## 2022-03-19 DIAGNOSIS — O10013 Pre-existing essential hypertension complicating pregnancy, third trimester: Secondary | ICD-10-CM

## 2022-03-19 DIAGNOSIS — O4700 False labor before 37 completed weeks of gestation, unspecified trimester: Secondary | ICD-10-CM | POA: Insufficient documentation

## 2022-03-19 DIAGNOSIS — E669 Obesity, unspecified: Secondary | ICD-10-CM | POA: Diagnosis not present

## 2022-03-19 DIAGNOSIS — O479 False labor, unspecified: Secondary | ICD-10-CM | POA: Diagnosis not present

## 2022-03-19 DIAGNOSIS — O0992 Supervision of high risk pregnancy, unspecified, second trimester: Secondary | ICD-10-CM

## 2022-03-19 DIAGNOSIS — O99213 Obesity complicating pregnancy, third trimester: Secondary | ICD-10-CM

## 2022-03-19 DIAGNOSIS — O10919 Unspecified pre-existing hypertension complicating pregnancy, unspecified trimester: Secondary | ICD-10-CM

## 2022-03-19 DIAGNOSIS — Z3A35 35 weeks gestation of pregnancy: Secondary | ICD-10-CM | POA: Diagnosis not present

## 2022-03-19 DIAGNOSIS — O26893 Other specified pregnancy related conditions, third trimester: Secondary | ICD-10-CM | POA: Diagnosis not present

## 2022-03-19 DIAGNOSIS — O288 Other abnormal findings on antenatal screening of mother: Secondary | ICD-10-CM

## 2022-03-19 HISTORY — DX: Urinary tract infection, site not specified: N39.0

## 2022-03-19 HISTORY — DX: Essential (primary) hypertension: I10

## 2022-03-19 LAB — URINALYSIS, ROUTINE W REFLEX MICROSCOPIC
Bacteria, UA: NONE SEEN
Bilirubin Urine: NEGATIVE
Glucose, UA: NEGATIVE mg/dL
Hgb urine dipstick: NEGATIVE
Ketones, ur: NEGATIVE mg/dL
Leukocytes,Ua: NEGATIVE
Nitrite: NEGATIVE
Protein, ur: 30 mg/dL — AB
Specific Gravity, Urine: 1.035 — ABNORMAL HIGH (ref 1.005–1.030)
pH: 6 (ref 5.0–8.0)

## 2022-03-19 MED ORDER — ACETAMINOPHEN 500 MG PO TABS
1000.0000 mg | ORAL_TABLET | Freq: Once | ORAL | Status: AC
  Administered 2022-03-19: 1000 mg via ORAL
  Filled 2022-03-19: qty 2

## 2022-03-19 NOTE — Progress Notes (Signed)
HIGH-RISK PREGNANCY VISIT Patient name: Marcia Gonzalez MRN [id number]  Date of birth: 03-Oct-1995 Chief Complaint:   Routine Prenatal Visit  History of Present Illness:   Marcia Gonzalez is a 26 y.o. G13P0020 female at [redacted]w[redacted]d with an Estimated Date of Delivery: 04/22/22 being seen today for ongoing management of a high-risk pregnancy complicated by:  -Chronic HTN- on Labetalol 200mg  bid -Hypothyroidism- on synthroid.    Today she reports  that she isn't feeling great. Notes irregular contractions and back pain .   Contractions: Irritability. Vag. Bleeding: None.  Movement: Present. denies leaking of fluid.      01/28/2022   10:12 AM 10/21/2021    2:55 PM 09/19/2021    2:39 PM 08/13/2021    3:46 PM 06/30/2021    3:19 PM  Depression screen PHQ 2/9  Decreased Interest 1 1 1 1 1   Down, Depressed, Hopeless 1 1 1 1 2   PHQ - 2 Score 2 2 2 2 3   Altered sleeping 3 3 0 1 2  Tired, decreased energy 3 3 0 1 2  Change in appetite 3 3 0 1 1  Feeling bad or failure about yourself  2 0 0 1 1  Trouble concentrating 1 0 0 1 0  Moving slowly or fidgety/restless 0 0 0 1 1  Suicidal thoughts 0 0 0 0 0  PHQ-9 Score 14 11 2 8 10   Difficult doing work/chores   Not difficult at all Somewhat difficult Very difficult     Current Outpatient Medications  Medication Instructions   albuterol (ACCUNEB) 0.63 MG/3ML nebulizer solution 1 ampule, Every 6 hours PRN   aspirin EC 81 mg, Oral, Daily, Swallow whole.   Blood Pressure Monitor MISC For regular home bp monitoring during pregnancy   buPROPion (WELLBUTRIN XL) 150 mg, Oral, Daily   busPIRone (BUSPAR) 5 mg, Oral, 3 times daily   cyclobenzaprine (FLEXERIL) 10 mg, Oral, 3 times daily PRN   docusate sodium (COLACE) 100 mg, Oral, 2 times daily PRN   Doxylamine-Pyridoxine 10-10 MG TBEC 1 tablet, Oral, Nightly   escitalopram (LEXAPRO) 20 mg, Oral, Daily, (NEEDS TO BE SEEN BEFORE NEXT REFILL)   ferrous sulfate 325 mg, Oral, Daily with breakfast   folic acid  (FOLVITE) 1 mg, Oral, Daily   labetalol (NORMODYNE) 200 mg, Oral, 2 times daily   levothyroxine (SYNTHROID) 88 mcg, Oral, Daily before breakfast   omeprazole (PRILOSEC) 40 mg, Oral, Daily   ondansetron (ZOFRAN-ODT) 4 mg, Oral, Every 8 hours PRN   Prenatal Vit-Fe Fumarate-FA (PRENATAL MULTIVITAMIN) TABS tablet 1 tablet, Oral, Daily   promethazine (PHENERGAN) 12.5 mg, Oral, Every 6 hours PRN     Review of Systems:   Pertinent items are noted in HPI Denies abnormal vaginal discharge w/ itching/odor/irritation, headaches, visual changes, shortness of breath, chest pain, abdominal pain, severe nausea/vomiting, or problems with urination or bowel movements unless otherwise stated above. Pertinent History Reviewed:  Reviewed past medical,surgical, social, obstetrical and family history.  Reviewed problem list, medications and allergies. Physical Assessment:   Vitals:   03/19/22 1509  BP: 112/64  Pulse: (!) 102  Weight: 237 lb (107.5 kg)  Body mass index is 39.44 kg/m.           Physical Examination:   General appearance: alert, well appearing, and in no distress  Mental status: normal mood, behavior, speech, dress, motor activity, and thought processes  Skin: warm & dry   Extremities: Edema: Trace    Cardiovascular: normal heart rate noted  Respiratory: normal  respiratory effort, no distress  Abdomen: gravid, soft, non-tender  Pelvic: Cervical exam deferred         Fetal Status:     Movement: Present    Fetal Surveillance Testing today: NST  NST being performed due to chronic HTN   Fetal Monitoring:  Baseline: 140 bpm, Variability: minimal variability- occasional moderate variability, Accelerations: absent  and Decelerations: Absent     Final diagnosis:  Non- Reactive NST      Chaperone: N/A    No results found for this or any previous visit (from the past 24 hour(s)).   Assessment & Plan:  High-risk pregnancy: G3P0020 at [redacted]w[redacted]d with an Estimated Date of Delivery:  04/22/22   1) Chronic HTN -BP appropriate with current meds -currently asymptomatic -NST non-reactive today, pt sent to MAU for further evaluation  -if FWB reassuring, will continue with twice weekly visit -GBS/GC/C next visit -IOL 37-39wks  Meds: No orders of the defined types were placed in this encounter.   Labs/procedures today: NST  Treatment Plan:  as outlined above  Reviewed: Preterm labor symptoms and general obstetric precautions including but not limited to vaginal bleeding, contractions, leaking of fluid and fetal movement were reviewed in detail with the patient.  All questions were answered. Pt has home bp cuff. Check bp weekly, let us know if >140/90.   Follow-up: Return for twice weekly as scheduled.   Future Appointments  Date Time Provider Department Center  03/26/2022 10:00 AM [facility] - [facility] [place] [facility] None  03/26/2022 10:50 AM Myna Hidalgo, DO [facility]  03/30/2022 10:10 AM [facility] NURSE [facility]  04/02/2022 10:30 AM [facility] [place] [facility] None  04/02/2022 11:30 AM Myna Hidalgo, DO [facility]  04/06/2022 10:10 AM [facility]  04/09/2022  8:30 AM [facility] [place] [facility] None  04/09/2022 10:10 AM Lazaro Arms, MD [facility]    No orders of the defined types were placed in this encounter.   Myna Hidalgo, DO Attending Obstetrician & Gynecologist, [facility] for [employer], [facility]

## 2022-03-19 NOTE — MAU Note (Signed)
Marcia Gonzalez is a 26 y.o. at [redacted]w[redacted]d here in MAU reporting: sent from office, pt contracting, cramping and having lower back pain.  Tracing also non-reactive. No bleeding or leaking.  Onset of complaint: had contractions last night,  woke up with back pain this morning, then came to front Pain score: abd 6/back 8 Vitals:   03/19/22 1705 03/19/22 1706  BP:  137/65  Pulse:  82  Resp:  18  Temp:  98 F (36.7 C)  SpO2: 98%      FHT:142 Lab orders placed from triage:  taken directly to rm

## 2022-03-19 NOTE — MAU Provider Note (Signed)
History     [id number]  Arrival date and time: 03/19/22 1644    Chief Complaint  Patient presents with   Abdominal Pain   Back Pain   Contractions   non reactive FHR tracing     HPI Marcia Gonzalez is a 26 y.o. at [redacted]w[redacted]d who presents for fetal monitoring. Patient was in the office today & had a non reactive NST. Was sent for further monitoring & BPP. Patient reports feeling normal fetal movement.  Also reports abdominal cramping & low back pain that occurs more than 10 times per hour. Started last night. Denies n/v/d, dysuria, vaginal bleeding, or LOF.    OB History     Gravida  3   Para  0   Term  0   Preterm  0   AB  2   Living  0      SAB  2   IAB  0   Ectopic  0   Multiple  0   Live Births  0           Past Medical History:  Diagnosis Date   Allergy    Anemia    Anxiety    Arthritis    Asthma    Depression    Endometriosis    GERD (gastroesophageal reflux disease)    Hypertension    gestational   Hypothyroidism    IIH (idiopathic intracranial hypertension)    UTI (urinary tract infection)    Wells' syndrome     Past Surgical History:  Procedure Laterality Date   ABDOMINAL EXPLORATION SURGERY  04/2021   DILATION AND CURETTAGE OF UTERUS     FINGER SURGERY     LAPAROTOMY      Family History  Problem Relation Age of Onset   Kidney disease Mother    Hypertension Mother    Hyperlipidemia Mother    Depression Mother    Cancer Mother        cervical   Arthritis Mother    Anxiety disorder Mother    Depression Father    Anxiety disorder Father    Asthma Brother    COPD Maternal Grandmother    Arthritis Maternal Grandmother     Allergies  Allergen Reactions   Codeine Nausea Only   Augmentin [Amoxicillin-Pot Clavulanate] Other (See Comments)    Hallucinations   Oxycodone Hives and Itching   Other Rash    Latex tape, causes reaction and blisters.    No current facility-administered medications on file prior to encounter.    Current Outpatient Medications on File Prior to Encounter  Medication Sig Dispense Refill   acetaminophen (TYLENOL) 500 MG tablet Take 1,000 mg by mouth every 6 (six) hours as needed.     aspirin 81 MG EC tablet Take 1 tablet (81 mg total) by mouth daily. Swallow whole. 90 tablet 3   buPROPion (WELLBUTRIN XL) 150 MG 24 hr tablet Take 150 mg by mouth daily.     busPIRone (BUSPAR) 5 MG tablet Take 1 tablet (5 mg total) by mouth 3 (three) times daily. 90 tablet 3   cyclobenzaprine (FLEXERIL) 10 MG tablet Take 1 tablet (10 mg total) by mouth 3 (three) times daily as needed. 30 tablet 1   docusate sodium (COLACE) 100 MG capsule Take 1 capsule (100 mg total) by mouth 2 (two) times daily as needed. 30 capsule 2   escitalopram (LEXAPRO) 20 MG tablet Take 1 tablet (20 mg total) by mouth daily. (NEEDS TO BE SEEN BEFORE NEXT REFILL)  30 tablet 0   ferrous sulfate 325 (65 FE) MG tablet Take 325 mg by mouth daily with breakfast.     folic acid (FOLVITE) 1 MG tablet Take 1 mg by mouth daily.     labetalol (NORMODYNE) 200 MG tablet Take 1 tablet (200 mg total) by mouth 2 (two) times daily. 60 tablet 3   levothyroxine (SYNTHROID) 88 MCG tablet Take 88 mcg by mouth daily before breakfast.     omeprazole (PRILOSEC) 40 MG capsule Take 1 capsule (40 mg total) by mouth daily. 30 capsule 3   ondansetron (ZOFRAN-ODT) 4 MG disintegrating tablet Take 1 tablet (4 mg total) by mouth every 8 (eight) hours as needed for nausea or vomiting. 30 tablet 2   Prenatal Vit-Fe Fumarate-FA (PRENATAL MULTIVITAMIN) TABS tablet Take 1 tablet by mouth daily at 12 noon.     promethazine (PHENERGAN) 12.5 MG tablet Take 1 tablet (12.5 mg total) by mouth every 6 (six) hours as needed for nausea or vomiting. 30 tablet 0   albuterol (ACCUNEB) 0.63 MG/3ML nebulizer solution Take 1 ampule by nebulization every 6 (six) hours as needed for wheezing. (Patient not taking: Reported on 03/10/2022)     Blood Pressure Monitor MISC For regular home bp  monitoring during pregnancy (Patient not taking: Reported on 03/19/2022) 1 each 0   Doxylamine-Pyridoxine 10-10 MG TBEC Take 1 tablet by mouth at bedtime. 60 tablet 3     ROS Pertinent positives and negative per HPI, all others reviewed and negative  Physical Exam   BP 110/64   Pulse 92   Temp 98 F (36.7 C) (Oral)   Resp 18   Ht 5\' 5"  (1.651 m)   Wt 108.1 kg   LMP 07/22/2021 (Exact Date)   SpO2 99%   BMI 39.66 kg/m   Patient Vitals for the past 24 hrs:  BP Temp Temp src Pulse Resp SpO2 Height Weight  03/19/22 1741 110/64 -- -- 92 -- 99 % -- --  03/19/22 1706 137/65 98 F (36.7 C) Oral 82 18 -- -- --  03/19/22 1705 -- -- -- -- -- 98 % -- --  03/19/22 1659 -- -- -- -- -- -- 5\' 5"  (1.651 m) 108.1 kg    Physical Exam Vitals and nursing note reviewed.  Constitutional:      General: She is not in acute distress.    Appearance: She is well-developed.  HENT:     Head: Normocephalic and atraumatic.  Pulmonary:     Effort: Pulmonary effort is normal. No respiratory distress.  Skin:    General: Skin is warm and dry.  Neurological:     Mental Status: She is alert.      Cervical Exam Dilation: Closed Effacement (%): 50 Cervical Position: Posterior Station: Ballotable Exam by:: NP    FHT Baseline 140, moderate variability, 15x15 accels, variable decel Toco: irregular Cat: 1  Labs Results for orders placed or performed during the hospital encounter of 03/19/22 (from the past 24 hour(s))  Urinalysis, Routine w reflex microscopic Urine, Clean Catch     Status: Abnormal   Collection Time: 03/19/22  5:39 PM  Result Value Ref Range   Color, Urine AMBER (A) YELLOW   APPearance HAZY (A) CLEAR   Specific Gravity, Urine 1.035 (H) 1.005 - 1.030   pH 6.0 5.0 - 8.0   Glucose, UA NEGATIVE NEGATIVE mg/dL   Hgb urine dipstick NEGATIVE NEGATIVE   Bilirubin Urine NEGATIVE NEGATIVE   Ketones, ur NEGATIVE NEGATIVE mg/dL  Protein, ur 30 (A) NEGATIVE mg/dL    Nitrite NEGATIVE NEGATIVE   Leukocytes,Ua NEGATIVE NEGATIVE   RBC / HPF 0-5 0 - 5 RBC/hpf   WBC, UA 0-5 0 - 5 WBC/hpf   Bacteria, UA NONE SEEN NONE SEEN   Squamous Epithelial / LPF 21-50 0 - 5   Mucus PRESENT    Non Squamous Epithelial 0-5 (A) NONE SEEN    Imaging No results found.  MAU Course  Procedures Lab Orders         Urinalysis, Routine w reflex microscopic Urine, Clean Catch     Meds ordered this encounter  Medications   acetaminophen (TYLENOL) tablet 1,000 mg   Imaging Orders         [place] MFM FETAL BPP WO NON STRESS     MDM Irregular contractions on monitor. Cervix closed.   Fetal tracing initially non reactive & 1 variable decel when first placed on monitor. BPP 6/8, off for movement.  Patient appreciates normal movement & hit movement button 17 times in the first 20 minutes of her monitoring.   After prolonged monitoring, had period of reactivity with 15 x 15 accelerations. Continues to have moderate variability & no further decelerations. Fetal tracing reviewed by Dr. Macon Large. Ok for discharge home Assessment and Plan   1. Non-reactive NST (non-stress test)  -Became reactive with prolonged monitoring. Patient reports normal movement.  BPP 8/10. Patient has closed follow up. Reviewed fetal kick counts & when to return  2. Braxton Hick's contraction  -Reviewed labor precautions  3. [redacted] weeks gestation of pregnancy      Judeth Horn, NP 03/19/22 8:54 PM

## 2022-03-20 ENCOUNTER — Other Ambulatory Visit: Payer: Self-pay | Admitting: Family Medicine

## 2022-03-20 DIAGNOSIS — F411 Generalized anxiety disorder: Secondary | ICD-10-CM

## 2022-03-20 DIAGNOSIS — F331 Major depressive disorder, recurrent, moderate: Secondary | ICD-10-CM

## 2022-03-21 ENCOUNTER — Other Ambulatory Visit: Payer: Self-pay | Admitting: Family Medicine

## 2022-03-21 DIAGNOSIS — F331 Major depressive disorder, recurrent, moderate: Secondary | ICD-10-CM

## 2022-03-21 DIAGNOSIS — F411 Generalized anxiety disorder: Secondary | ICD-10-CM

## 2022-03-24 MED ORDER — ESCITALOPRAM OXALATE 20 MG PO TABS: 20.0000 mg | ORAL_TABLET | Freq: Every day | ORAL | 0 refills | Status: DC

## 2022-03-25 ENCOUNTER — Other Ambulatory Visit: Payer: Self-pay | Admitting: Advanced Practice Midwife

## 2022-03-25 DIAGNOSIS — O10919 Unspecified pre-existing hypertension complicating pregnancy, unspecified trimester: Secondary | ICD-10-CM

## 2022-03-25 LAB — CERVICOVAGINAL ANCILLARY ONLY
Bacterial Vaginitis (gardnerella): NEGATIVE
Candida Glabrata: NEGATIVE
Candida Vaginitis: NEGATIVE
Comment: NEGATIVE
Comment: NEGATIVE
Comment: NEGATIVE
Comment: NEGATIVE
Comment: NEGATIVE
Comment: NORMAL
Trichomonas: NEGATIVE

## 2022-03-26 ENCOUNTER — Other Ambulatory Visit: Payer: Self-pay | Admitting: Advanced Practice Midwife

## 2022-03-26 ENCOUNTER — Ambulatory Visit (INDEPENDENT_AMBULATORY_CARE_PROVIDER_SITE_OTHER): Payer: BC Managed Care – PPO | Admitting: Obstetrics & Gynecology

## 2022-03-26 ENCOUNTER — Other Ambulatory Visit (HOSPITAL_COMMUNITY)
Admission: RE | Admit: 2022-03-26 | Discharge: 2022-03-26 | Disposition: A | Discharge status: Home / Self Care | Payer: BC Managed Care – PPO | Source: Ambulatory Visit | Attending: Obstetrics & Gynecology | Admitting: Obstetrics & Gynecology

## 2022-03-26 ENCOUNTER — Ambulatory Visit (INDEPENDENT_AMBULATORY_CARE_PROVIDER_SITE_OTHER): Payer: BC Managed Care – PPO

## 2022-03-26 ENCOUNTER — Encounter: Payer: Self-pay | Admitting: Obstetrics & Gynecology

## 2022-03-26 VITALS — BP 103/56 | HR 87 | Wt 238.0 lb

## 2022-03-26 DIAGNOSIS — Z3A36 36 weeks gestation of pregnancy: Secondary | ICD-10-CM | POA: Insufficient documentation

## 2022-03-26 DIAGNOSIS — O10919 Unspecified pre-existing hypertension complicating pregnancy, unspecified trimester: Secondary | ICD-10-CM | POA: Diagnosis not present

## 2022-03-26 DIAGNOSIS — O09893 Supervision of other high risk pregnancies, third trimester: Secondary | ICD-10-CM

## 2022-03-26 DIAGNOSIS — F331 Major depressive disorder, recurrent, moderate: Secondary | ICD-10-CM

## 2022-03-26 DIAGNOSIS — F411 Generalized anxiety disorder: Secondary | ICD-10-CM

## 2022-03-26 DIAGNOSIS — E039 Hypothyroidism, unspecified: Secondary | ICD-10-CM

## 2022-03-26 DIAGNOSIS — F418 Other specified anxiety disorders: Secondary | ICD-10-CM

## 2022-03-26 DIAGNOSIS — O0992 Supervision of high risk pregnancy, unspecified, second trimester: Secondary | ICD-10-CM

## 2022-03-26 DIAGNOSIS — O99283 Endocrine, nutritional and metabolic diseases complicating pregnancy, third trimester: Secondary | ICD-10-CM

## 2022-03-26 MED ORDER — LEVOTHYROXINE SODIUM 88 MCG PO TABS: 88.0000 ug | ORAL_TABLET | Freq: Every day | ORAL | 4 refills | Status: DC

## 2022-03-26 MED ORDER — ESCITALOPRAM OXALATE 20 MG PO TABS: 20.0000 mg | ORAL_TABLET | Freq: Every day | ORAL | 4 refills | Status: DC

## 2022-03-26 MED ORDER — BUPROPION HCL ER (XL) 150 MG PO TB24: 150.0000 mg | ORAL_TABLET | Freq: Every day | ORAL | 4 refills | Status: DC

## 2022-03-26 MED ORDER — BUSPIRONE HCL 5 MG PO TABS: 5.0000 mg | ORAL_TABLET | Freq: Three times a day (TID) | ORAL | 3 refills | Status: DC

## 2022-03-26 NOTE — Progress Notes (Signed)
HIGH-RISK PREGNANCY VISIT Patient name: Marcia Gonzalez MRN [id number]  Date of birth: 15-Apr-1996 Chief Complaint:   Routine Prenatal Visit  History of Present Illness:   Marcia Gonzalez is a 26 y.o. G23P0020 female at [redacted]w[redacted]d with an Estimated Date of Delivery: 04/22/22 being seen today for ongoing management of a high-risk pregnancy complicated by:  -chronic HTN- BP stable with current medication, currently aasymptomatic -Hypothyroidism -Anxiety/Dep   Today she reports backache.   Contractions: Irritability. Vag. Bleeding: None.  Movement: Present. denies leaking of fluid.      01/28/2022   10:12 AM 10/21/2021    2:55 PM 09/19/2021    2:39 PM 08/13/2021    3:46 PM 06/30/2021    3:19 PM  Depression screen PHQ 2/9  Decreased Interest 1 1 1 1 1   Down, Depressed, Hopeless 1 1 1 1 2   PHQ - 2 Score 2 2 2 2 3   Altered sleeping 3 3 0 1 2  Tired, decreased energy 3 3 0 1 2  Change in appetite 3 3 0 1 1  Feeling bad or failure about yourself  2 0 0 1 1  Trouble concentrating 1 0 0 1 0  Moving slowly or fidgety/restless 0 0 0 1 1  Suicidal thoughts 0 0 0 0 0  PHQ-9 Score 14 11 2 8 10   Difficult doing work/chores   Not difficult at all Somewhat difficult Very difficult     Current Outpatient Medications  Medication Instructions   acetaminophen (TYLENOL) 1,000 mg, Oral, Every 6 hours PRN   aspirin EC 81 mg, Oral, Daily, Swallow whole.   buPROPion (WELLBUTRIN XL) 150 mg, Oral, Daily   busPIRone (BUSPAR) 5 mg, Oral, 3 times daily   cyclobenzaprine (FLEXERIL) 10 mg, Oral, 3 times daily PRN   docusate sodium (COLACE) 100 mg, Oral, 2 times daily PRN   escitalopram (LEXAPRO) 20 mg, Oral, Daily, (NEEDS TO BE SEEN BEFORE NEXT REFILL)   ferrous sulfate 325 mg, Oral, Daily with breakfast   folic acid (FOLVITE) 1 mg, Oral, Daily   labetalol (NORMODYNE) 200 mg, Oral, 2 times daily   levothyroxine (SYNTHROID) 88 mcg, Oral, Daily before breakfast   omeprazole (PRILOSEC) 40 mg, Oral, Daily    ondansetron (ZOFRAN-ODT) 4 mg, Oral, Every 8 hours PRN   Prenatal Vit-Fe Fumarate-FA (PRENATAL MULTIVITAMIN) TABS tablet 1 tablet, Oral, Daily   promethazine (PHENERGAN) 12.5 mg, Oral, Every 6 hours PRN     Review of Systems:   Pertinent items are noted in HPI Some discharge- no itching or odor.  Denies headaches, visual changes, shortness of breath, chest pain, abdominal pain, severe nausea/vomiting, or problems with urination or bowel movements unless otherwise stated above. Pertinent History Reviewed:  Reviewed past medical,surgical, social, obstetrical and family history.  Reviewed problem list, medications and allergies. Physical Assessment:   Vitals:   03/26/22 1059  BP: (!) 103/56  Pulse: 87  Weight: 238 lb (108 kg)  Body mass index is 39.61 kg/m.           Physical Examination:   General appearance: alert, well appearing, and in no distress  Mental status: normal mood  Skin: warm & dry   Extremities: Edema: Trace    Cardiovascular: normal heart rate noted  Respiratory: normal respiratory effort, no distress  Abdomen: gravid, soft, non-tender  Pelvic: Cervical exam performed  Dilation: Closed Effacement (%): Thick Station: -3  cultures obtained  Fetal Status:     Movement: Present    Fetal Surveillance Testing today: 36+1  wks,cephalic,BPP 8/8,FHR 140 bpm,posterior placenta gr 2,AFI 14 cm,RI  .63,.72,.69,.74=95%,EFW 2974 g 63%   Chaperone:  husband present     No results found for this or any previous visit (from the past 24 hour(s)).   Assessment & Plan:  High-risk pregnancy: G3P0020 at [redacted]w[redacted]d with an Estimated Date of Delivery: 04/22/22   1) Chronic HTN -stable with Labetalol -BPP 8/8, dopplers elevated -continue antepartum testing -discussed IOL 37-39wk, desires IOL ~ 37wk, scheduled for Friday 9/15  2) Anxiety -refilled current medication -notes increased anxiety regarding delivery, risk of demise and other concerns -strongly encouraged pt to see  therapist as previously discussed -again discussed establishing care with psych  Meds:  Meds ordered this encounter  Medications   buPROPion (WELLBUTRIN XL) 150 MG 24 hr tablet    Sig: Take 1 tablet (150 mg total) by mouth daily.    Dispense:  90 tablet    Refill:  4   busPIRone (BUSPAR) 5 MG tablet    Sig: Take 1 tablet (5 mg total) by mouth 3 (three) times daily.    Dispense:  90 tablet    Refill:  3   escitalopram (LEXAPRO) 20 MG tablet    Sig: Take 1 tablet (20 mg total) by mouth daily. (NEEDS TO BE SEEN BEFORE NEXT REFILL)    Dispense:  90 tablet    Refill:  4    This prescription was filled on 02/23/2022. Any refills authorized will be placed on file.   levothyroxine (SYNTHROID) 88 MCG tablet    Sig: Take 1 tablet (88 mcg total) by mouth daily before breakfast.    Dispense:  90 tablet    Refill:  4    NAME BRAND ONLY    Labs/procedures today: BPP/growth, GBS, GC/C collected  Treatment Plan:  as outlined above, IOL scheduled  Reviewed: Preterm labor symptoms and general obstetric precautions including but not limited to vaginal bleeding, contractions, leaking of fluid and fetal movement were reviewed in detail with the patient.  All questions were answered. Pt has home bp cuff. Check bp weekly, let us know if >140/90.   Follow-up: Return for as scheduled twice weekly.   Future Appointments  Date Time Provider Department Center  03/30/2022 10:10 AM [facility] NURSE [facility]  04/02/2022 10:30 AM [facility] [place] [facility] None  04/02/2022 11:30 AM Myna Hidalgo, DO [facility]  04/03/2022  6:30 AM [facility] SCHED ROOM [facility] None  04/10/2022 12:50 PM [facility] NURSE [facility]  05/11/2022  2:10 PM Cheral Marker, CNM [facility]    Orders Placed This Encounter  Procedures   Culture, beta strep (group b only)   POC Urinalysis Dipstick OB    Myna Hidalgo, DO Attending Obstetrician & Gynecologist, [facility] [employer], [facility]

## 2022-03-26 NOTE — Progress Notes (Signed)
[place] 36+1 wks,cephalic,BPP 8/8,FHR 140 bpm,posterior placenta gr 2,AFI 14 cm,RI  .63,.72,.69,.74=95%,EFW 2974 g 63%

## 2022-03-28 ENCOUNTER — Other Ambulatory Visit: Payer: Self-pay | Admitting: Obstetrics & Gynecology

## 2022-03-30 ENCOUNTER — Other Ambulatory Visit: Payer: BC Managed Care – PPO

## 2022-03-30 LAB — CULTURE, BETA STREP (GROUP B ONLY): Strep Gp B Culture: NEGATIVE

## 2022-03-30 LAB — CERVICOVAGINAL ANCILLARY ONLY
Chlamydia: NEGATIVE
Comment: NEGATIVE
Comment: NORMAL
Neisseria Gonorrhea: NEGATIVE

## 2022-03-31 ENCOUNTER — Other Ambulatory Visit: Payer: Self-pay | Admitting: Obstetrics & Gynecology

## 2022-03-31 DIAGNOSIS — O10919 Unspecified pre-existing hypertension complicating pregnancy, unspecified trimester: Secondary | ICD-10-CM

## 2022-03-31 NOTE — Progress Notes (Signed)
Orders for admission 

## 2022-04-01 ENCOUNTER — Encounter (HOSPITAL_COMMUNITY): Payer: BC Managed Care – PPO

## 2022-04-01 ENCOUNTER — Other Ambulatory Visit: Payer: Self-pay | Admitting: Advanced Practice Midwife

## 2022-04-01 DIAGNOSIS — O10919 Unspecified pre-existing hypertension complicating pregnancy, unspecified trimester: Secondary | ICD-10-CM

## 2022-04-02 ENCOUNTER — Ambulatory Visit (INDEPENDENT_AMBULATORY_CARE_PROVIDER_SITE_OTHER): Payer: BC Managed Care – PPO | Admitting: Obstetrics & Gynecology

## 2022-04-02 ENCOUNTER — Encounter: Payer: Self-pay | Admitting: Obstetrics & Gynecology

## 2022-04-02 ENCOUNTER — Encounter: Payer: BC Managed Care – PPO | Admitting: Obstetrics & Gynecology

## 2022-04-02 ENCOUNTER — Encounter (HOSPITAL_COMMUNITY): Payer: Self-pay | Admitting: *Deleted

## 2022-04-02 ENCOUNTER — Ambulatory Visit (INDEPENDENT_AMBULATORY_CARE_PROVIDER_SITE_OTHER): Payer: BC Managed Care – PPO

## 2022-04-02 ENCOUNTER — Other Ambulatory Visit: Payer: BC Managed Care – PPO

## 2022-04-02 ENCOUNTER — Telehealth (HOSPITAL_COMMUNITY): Payer: Self-pay | Admitting: *Deleted

## 2022-04-02 VITALS — BP 106/67 | HR 83 | Wt 237.4 lb

## 2022-04-02 DIAGNOSIS — F418 Other specified anxiety disorders: Secondary | ICD-10-CM

## 2022-04-02 DIAGNOSIS — Z3A37 37 weeks gestation of pregnancy: Secondary | ICD-10-CM

## 2022-04-02 DIAGNOSIS — O10919 Unspecified pre-existing hypertension complicating pregnancy, unspecified trimester: Secondary | ICD-10-CM | POA: Diagnosis not present

## 2022-04-02 DIAGNOSIS — O0992 Supervision of high risk pregnancy, unspecified, second trimester: Secondary | ICD-10-CM

## 2022-04-02 DIAGNOSIS — O09893 Supervision of other high risk pregnancies, third trimester: Secondary | ICD-10-CM

## 2022-04-02 NOTE — Progress Notes (Signed)
[place] 37+1 wks,cephalic,elevated UAD with EDF,RI .68,.74,.75=98.5%,posterior placenta gr 2,BPP 8/8,FHR 135 BPM

## 2022-04-02 NOTE — Progress Notes (Signed)
HIGH-RISK PREGNANCY VISIT Patient name: Marcia Gonzalez MRN [id number]  Date of birth: 1996-04-21 Chief Complaint:   Routine Prenatal Visit  History of Present Illness:   Marcia Gonzalez is a 26 y.o. G11P0020 female at [redacted]w[redacted]d with an Estimated Date of Delivery: 04/22/22 being seen today for ongoing management of a high-risk pregnancy complicated by:  chronic HTN- BP stable with current medication, currently asymptomatic -Hypothyroidism- stable -Anxiety/Dep- on wellbutrin, lexaprin, buspar   Today she reports  pelvic pressure .   Contractions: Irritability. Vag. Bleeding: None.  Movement: Present. denies leaking of fluid.      01/28/2022   10:12 AM 10/21/2021    2:55 PM 09/19/2021    2:39 PM 08/13/2021    3:46 PM 06/30/2021    3:19 PM  Depression screen PHQ 2/9  Decreased Interest 1 1 1 1 1   Down, Depressed, Hopeless 1 1 1 1 2   PHQ - 2 Score 2 2 2 2 3   Altered sleeping 3 3 0 1 2  Tired, decreased energy 3 3 0 1 2  Change in appetite 3 3 0 1 1  Feeling bad or failure about yourself  2 0 0 1 1  Trouble concentrating 1 0 0 1 0  Moving slowly or fidgety/restless 0 0 0 1 1  Suicidal thoughts 0 0 0 0 0  PHQ-9 Score 14 11 2 8 10   Difficult doing work/chores   Not difficult at all Somewhat difficult Very difficult     Current Outpatient Medications  Medication Instructions   acetaminophen (TYLENOL) 1,000 mg, Oral, Every 6 hours PRN   aspirin EC 81 mg, Oral, Daily, Swallow whole.   buPROPion (WELLBUTRIN XL) 150 mg, Oral, Daily   busPIRone (BUSPAR) 5 mg, Oral, 3 times daily   cyclobenzaprine (FLEXERIL) 10 mg, Oral, 3 times daily PRN   docusate sodium (COLACE) 100 mg, Oral, 2 times daily PRN   escitalopram (LEXAPRO) 20 mg, Oral, Daily, (NEEDS TO BE SEEN BEFORE NEXT REFILL)   ferrous sulfate 325 mg, Oral, Daily with breakfast   folic acid (FOLVITE) 1 mg, Oral, Daily   labetalol (NORMODYNE) 200 mg, Oral, 2 times daily   levothyroxine (SYNTHROID) 88 mcg, Oral, Daily before breakfast    omeprazole (PRILOSEC) 40 mg, Oral, Daily   ondansetron (ZOFRAN-ODT) 4 mg, Oral, Every 8 hours PRN   Prenatal Vit-Fe Fumarate-FA (PRENATAL MULTIVITAMIN) TABS tablet 1 tablet, Oral, Daily   promethazine (PHENERGAN) 12.5 mg, Oral, Every 6 hours PRN     Review of Systems:   Pertinent items are noted in HPI Denies abnormal vaginal discharge w/ itching/odor/irritation, headaches, visual changes, shortness of breath, chest pain, abdominal pain, severe nausea/vomiting, or problems with urination or bowel movements unless otherwise stated above. Pertinent History Reviewed:  Reviewed past medical,surgical, social, obstetrical and family history.  Reviewed problem list, medications and allergies. Physical Assessment:   Vitals:   04/02/22 1118  BP: 106/67  Pulse: 83  Weight: 237 lb 6.4 oz (107.7 kg)  Body mass index is 39.51 kg/m.           Physical Examination:   General appearance: alert, well appearing, and in no distress  Mental status: normal mood, behavior, speech, dress, motor activity, and thought processes  Skin: warm & dry   Extremities: Edema: Trace    Cardiovascular: normal heart rate noted  Respiratory: normal respiratory effort, no distress  Abdomen: gravid, soft, non-tender  Pelvic: Cervical exam performed  Dilation: Closed Effacement (%): Thick Station: -3  narrow introitus  Fetal Status:  Movement: Present    Fetal Surveillance Testing today: cephalic,elevated UAD with EDF,RI .68,.74,.75=98.5%,posterior placenta gr 2,BPP 8/8,FHR 135 BPM   Chaperone: N/A    No results found for this or any previous visit (from the past 24 hour(s)).   Assessment & Plan:  High-risk pregnancy: G3P0020 at [redacted]w[redacted]d with an Estimated Date of Delivery: 04/22/22   chronic HTN- BP stable with current medication, currently aasymptomatic -Hypothyroidism continue current dose -Anxiety/Dep continue current meds   Meds: No orders of the defined types were placed in this  encounter.   Labs/procedures today: BPP  Treatment Plan:  IOL scheduled for tomorrow  Reviewed: Term labor symptoms and general obstetric precautions including but not limited to vaginal bleeding, contractions, leaking of fluid and fetal movement were reviewed in detail with the patient.  All questions were answered. Pt has home bp cuff. Check bp weekly, let us know if >140/90.   Follow-up: Return for IOL tmr, 1wk pp f/u as scheduled.   Future Appointments  Date Time Provider Department Center  04/03/2022  6:30 AM [facility] SCHED ROOM [facility] None  04/10/2022 12:50 PM [facility] NURSE [facility]  05/11/2022  2:10 PM Cheral Marker, CNM [facility]    Myna Hidalgo, DO Attending Obstetrician & Gynecologist, [facility] for [facility], [facility]

## 2022-04-02 NOTE — Telephone Encounter (Signed)
Preadmission screen  

## 2022-04-03 ENCOUNTER — Inpatient Hospital Stay (HOSPITAL_COMMUNITY)
Admission: AD | Admit: 2022-04-03 | Discharge: 2022-04-06 | DRG: 806 | Disposition: A | Discharge status: Home / Self Care | Payer: BC Managed Care – PPO | Attending: Obstetrics and Gynecology | Admitting: Obstetrics and Gynecology

## 2022-04-03 ENCOUNTER — Inpatient Hospital Stay (HOSPITAL_COMMUNITY): Payer: BC Managed Care – PPO

## 2022-04-03 ENCOUNTER — Inpatient Hospital Stay (HOSPITAL_COMMUNITY): Payer: BC Managed Care – PPO | Admitting: Anesthesiology

## 2022-04-03 ENCOUNTER — Other Ambulatory Visit: Payer: Self-pay

## 2022-04-03 ENCOUNTER — Encounter (HOSPITAL_COMMUNITY): Payer: Self-pay | Admitting: Obstetrics & Gynecology

## 2022-04-03 DIAGNOSIS — O10919 Unspecified pre-existing hypertension complicating pregnancy, unspecified trimester: Secondary | ICD-10-CM | POA: Diagnosis present

## 2022-04-03 DIAGNOSIS — Z3A37 37 weeks gestation of pregnancy: Secondary | ICD-10-CM | POA: Diagnosis not present

## 2022-04-03 DIAGNOSIS — O99284 Endocrine, nutritional and metabolic diseases complicating childbirth: Secondary | ICD-10-CM | POA: Diagnosis not present

## 2022-04-03 DIAGNOSIS — Z7982 Long term (current) use of aspirin: Secondary | ICD-10-CM | POA: Diagnosis not present

## 2022-04-03 DIAGNOSIS — Z818 Family history of other mental and behavioral disorders: Secondary | ICD-10-CM | POA: Diagnosis not present

## 2022-04-03 DIAGNOSIS — Z79899 Other long term (current) drug therapy: Secondary | ICD-10-CM | POA: Diagnosis not present

## 2022-04-03 DIAGNOSIS — O99214 Obesity complicating childbirth: Secondary | ICD-10-CM | POA: Diagnosis present

## 2022-04-03 DIAGNOSIS — E039 Hypothyroidism, unspecified: Secondary | ICD-10-CM | POA: Diagnosis present

## 2022-04-03 DIAGNOSIS — G932 Benign intracranial hypertension: Secondary | ICD-10-CM | POA: Diagnosis present

## 2022-04-03 DIAGNOSIS — O1002 Pre-existing essential hypertension complicating childbirth: Secondary | ICD-10-CM | POA: Diagnosis not present

## 2022-04-03 DIAGNOSIS — F329 Major depressive disorder, single episode, unspecified: Secondary | ICD-10-CM | POA: Diagnosis present

## 2022-04-03 DIAGNOSIS — O134 Gestational [pregnancy-induced] hypertension without significant proteinuria, complicating childbirth: Secondary | ICD-10-CM | POA: Diagnosis not present

## 2022-04-03 DIAGNOSIS — O9962 Diseases of the digestive system complicating childbirth: Secondary | ICD-10-CM | POA: Diagnosis present

## 2022-04-03 DIAGNOSIS — F411 Generalized anxiety disorder: Secondary | ICD-10-CM | POA: Diagnosis not present

## 2022-04-03 DIAGNOSIS — R12 Heartburn: Secondary | ICD-10-CM | POA: Diagnosis present

## 2022-04-03 DIAGNOSIS — O99344 Other mental disorders complicating childbirth: Secondary | ICD-10-CM | POA: Diagnosis not present

## 2022-04-03 LAB — COMPREHENSIVE METABOLIC PANEL
ALT: 14 U/L (ref 0–44)
AST: 23 U/L (ref 15–41)
Albumin: 2.7 g/dL — ABNORMAL LOW (ref 3.5–5.0)
Alkaline Phosphatase: 117 U/L (ref 38–126)
Anion gap: 10 (ref 5–15)
BUN: 8 mg/dL (ref 6–20)
CO2: 18 mmol/L — ABNORMAL LOW (ref 22–32)
Calcium: 8.8 mg/dL — ABNORMAL LOW (ref 8.9–10.3)
Chloride: 108 mmol/L (ref 98–111)
Creatinine, Ser: 0.56 mg/dL (ref 0.44–1.00)
GFR, Estimated: >60 mL/min (ref >60)
Glucose, Bld: 108 mg/dL — ABNORMAL HIGH (ref 70–99)
Potassium: 3.7 mmol/L (ref 3.5–5.1)
Sodium: 136 mmol/L (ref 135–145)
Total Bilirubin: 0.5 mg/dL (ref 0.3–1.2)
Total Protein: 5.7 g/dL — ABNORMAL LOW (ref 6.5–8.1)

## 2022-04-03 LAB — TYPE AND SCREEN
ABO/RH(D): O POS
Antibody Screen: NEGATIVE

## 2022-04-03 LAB — CBC
HCT: 32.1 % — ABNORMAL LOW (ref 36.0–46.0)
Hemoglobin: 11.2 g/dL — ABNORMAL LOW (ref 12.0–15.0)
MCH: 31.1 pg (ref 26.0–34.0)
MCHC: 34.9 g/dL (ref 30.0–36.0)
MCV: 89.2 fL (ref 80.0–100.0)
Platelets: 261 10*3/uL (ref 150–400)
RBC: 3.6 MIL/uL — ABNORMAL LOW (ref 3.87–5.11)
RDW: 12.8 % (ref 11.5–15.5)
WBC: 8.7 10*3/uL (ref 4.0–10.5)
nRBC: 0 % (ref 0.0–0.2)

## 2022-04-03 LAB — PROTEIN / CREATININE RATIO, URINE
Creatinine, Urine: 223 mg/dL
Protein Creatinine Ratio: 0.07 mg/mg{Cre} (ref 0.00–0.15)
Total Protein, Urine: 15 mg/dL

## 2022-04-03 LAB — RPR: RPR Ser Ql: NONREACTIVE

## 2022-04-03 MED ORDER — LACTATED RINGERS IV SOLN: INTRAVENOUS | Status: DC

## 2022-04-03 MED ORDER — HYDROXYZINE HCL 50 MG PO TABS
50.0000 mg | ORAL_TABLET | Freq: Four times a day (QID) | ORAL | Status: DC | PRN
  Administered 2022-04-04 (×2): 50 mg via ORAL
  Filled 2022-04-03 (×3): qty 1

## 2022-04-03 MED ORDER — ESCITALOPRAM OXALATE 20 MG PO TABS: 20.0000 mg | ORAL_TABLET | Freq: Every day | ORAL | Status: DC

## 2022-04-03 MED ORDER — FENTANYL-BUPIVACAINE-NACL 0.5-0.125-0.9 MG/250ML-% EP SOLN
12.0000 mL/h | EPIDURAL | Status: DC | PRN
  Administered 2022-04-03 – 2022-04-04 (×2): 12 mL/h via EPIDURAL
  Filled 2022-04-03 (×2): qty 250

## 2022-04-03 MED ORDER — OXYTOCIN BOLUS FROM INFUSION
333.0000 mL | Freq: Once | INTRAVENOUS | Status: AC
  Administered 2022-04-04: 333 mL via INTRAVENOUS

## 2022-04-03 MED ORDER — SOD CITRATE-CITRIC ACID 500-334 MG/5ML PO SOLN
30.0000 mL | ORAL | Status: DC | PRN
  Administered 2022-04-04: 30 mL via ORAL
  Filled 2022-04-03: qty 30

## 2022-04-03 MED ORDER — PHENYLEPHRINE 80 MCG/ML (10ML) SYRINGE FOR IV PUSH (FOR BLOOD PRESSURE SUPPORT): 80.0000 ug | PREFILLED_SYRINGE | INTRAVENOUS | Status: DC | PRN

## 2022-04-03 MED ORDER — OXYTOCIN-SODIUM CHLORIDE 30-0.9 UT/500ML-% IV SOLN
2.5000 [IU]/h | INTRAVENOUS | Status: DC
  Administered 2022-04-04: 2.5 [IU]/h via INTRAVENOUS
  Filled 2022-04-03: qty 500

## 2022-04-03 MED ORDER — ESCITALOPRAM OXALATE 20 MG PO TABS
20.0000 mg | ORAL_TABLET | Freq: Every day | ORAL | Status: DC
  Administered 2022-04-04 – 2022-04-06 (×3): 20 mg via ORAL
  Filled 2022-04-03 (×3): qty 1

## 2022-04-03 MED ORDER — EPHEDRINE 5 MG/ML INJ: 10.0000 mg | INTRAVENOUS | Status: DC | PRN

## 2022-04-03 MED ORDER — LACTATED RINGERS IV SOLN
500.0000 mL | Freq: Once | INTRAVENOUS | Status: AC
  Administered 2022-04-04: 500 mL via INTRAVENOUS

## 2022-04-03 MED ORDER — LABETALOL HCL 200 MG PO TABS
200.0000 mg | ORAL_TABLET | Freq: Two times a day (BID) | ORAL | Status: DC
  Administered 2022-04-03 – 2022-04-06 (×5): 200 mg via ORAL
  Filled 2022-04-03 (×5): qty 1

## 2022-04-03 MED ORDER — LEVOTHYROXINE SODIUM 88 MCG PO TABS: 88.0000 ug | ORAL_TABLET | Freq: Every day | ORAL | Status: DC

## 2022-04-03 MED ORDER — ONDANSETRON HCL 4 MG/2ML IJ SOLN
4.0000 mg | Freq: Four times a day (QID) | INTRAMUSCULAR | Status: DC | PRN
  Administered 2022-04-04: 4 mg via INTRAVENOUS
  Filled 2022-04-03: qty 2

## 2022-04-03 MED ORDER — LIDOCAINE HCL (PF) 1 % IJ SOLN
INTRAMUSCULAR | Status: DC | PRN
  Administered 2022-04-03 (×2): 5 mL via EPIDURAL

## 2022-04-03 MED ORDER — MISOPROSTOL 25 MCG QUARTER TABLET
25.0000 ug | ORAL_TABLET | Freq: Once | ORAL | Status: AC
  Administered 2022-04-03: 25 ug via VAGINAL
  Filled 2022-04-03: qty 1

## 2022-04-03 MED ORDER — TERBUTALINE SULFATE 1 MG/ML IJ SOLN: 0.2500 mg | Freq: Once | INTRAMUSCULAR | Status: DC | PRN

## 2022-04-03 MED ORDER — LEVOTHYROXINE SODIUM 88 MCG PO TABS
88.0000 ug | ORAL_TABLET | Freq: Every day | ORAL | Status: DC
  Administered 2022-04-04 – 2022-04-06 (×3): 88 ug via ORAL
  Filled 2022-04-03 (×4): qty 1

## 2022-04-03 MED ORDER — DIPHENHYDRAMINE HCL 50 MG/ML IJ SOLN
12.5000 mg | INTRAMUSCULAR | Status: AC | PRN
  Administered 2022-04-03 – 2022-04-04 (×4): 12.5 mg via INTRAVENOUS
  Filled 2022-04-03 (×2): qty 1

## 2022-04-03 MED ORDER — BUPROPION HCL ER (XL) 150 MG PO TB24
150.0000 mg | ORAL_TABLET | Freq: Every day | ORAL | Status: DC
  Administered 2022-04-04 – 2022-04-06 (×3): 150 mg via ORAL
  Filled 2022-04-03 (×3): qty 1

## 2022-04-03 MED ORDER — MISOPROSTOL 50MCG HALF TABLET
50.0000 ug | ORAL_TABLET | Freq: Once | ORAL | Status: AC
  Administered 2022-04-03: 50 ug via ORAL
  Filled 2022-04-03: qty 1

## 2022-04-03 MED ORDER — FENTANYL CITRATE (PF) 100 MCG/2ML IJ SOLN
50.0000 ug | INTRAMUSCULAR | Status: DC | PRN
  Administered 2022-04-03 (×2): 100 ug via INTRAVENOUS
  Filled 2022-04-03 (×2): qty 2

## 2022-04-03 MED ORDER — ACETAMINOPHEN 325 MG PO TABS
650.0000 mg | ORAL_TABLET | ORAL | Status: DC | PRN
  Administered 2022-04-03 (×2): 650 mg via ORAL
  Filled 2022-04-03 (×2): qty 2

## 2022-04-03 MED ORDER — LABETALOL HCL 200 MG PO TABS: 200.0000 mg | ORAL_TABLET | Freq: Two times a day (BID) | ORAL | Status: DC

## 2022-04-03 MED ORDER — BUSPIRONE HCL 5 MG PO TABS
5.0000 mg | ORAL_TABLET | Freq: Three times a day (TID) | ORAL | Status: DC
  Administered 2022-04-03 – 2022-04-06 (×8): 5 mg via ORAL
  Filled 2022-04-03 (×14): qty 1

## 2022-04-03 MED ORDER — BUPROPION HCL ER (XL) 150 MG PO TB24: 150.0000 mg | ORAL_TABLET | Freq: Every day | ORAL | Status: DC

## 2022-04-03 MED ORDER — MISOPROSTOL 25 MCG QUARTER TABLET
25.0000 ug | ORAL_TABLET | Freq: Once | ORAL | Status: AC
  Administered 2022-04-03: 25 ug via ORAL
  Filled 2022-04-03: qty 1

## 2022-04-03 MED ORDER — LACTATED RINGERS IV SOLN
500.0000 mL | INTRAVENOUS | Status: DC | PRN
  Administered 2022-04-04: 500 mL via INTRAVENOUS

## 2022-04-03 MED ORDER — LIDOCAINE HCL (PF) 1 % IJ SOLN: 30.0000 mL | INTRAMUSCULAR | Status: DC | PRN

## 2022-04-03 NOTE — Progress Notes (Signed)
Labor Progress Note Marcia Gonzalez is a 26 y.o. G3P0020 at [redacted]w[redacted]d presented for IOL due to cHTN S: Patient is resting comfortably, epidural in place.  O:  BP 112/76   Pulse 66   Temp 98.3 F (36.8 C) (Oral)   Resp 18   Ht 5\' 5"  (1.651 m)   Wt 108.9 kg   LMP 07/22/2021 (Exact Date)   BMI 39.94 kg/m  EFM: 135/moderate variability/accels +  CVE: Dilation: 5 Effacement (%): 70 Cervical Position: Middle Station: -2 Presentation: Vertex Exam by:: [place], rn   A&P: 26 y.o. G3P0020 [redacted]w[redacted]d here for IOL due to Chronic HTN. #Labor: Progressing well. Foley bulb out. AROM. IUPC placed. #Pain: Epidural in place. #FWB: Cat 1 #GBS negative #cHTN: Continue Labetalol 200mg  BID, Pre-e labs negative.  [redacted]w[redacted]d, MD [facility], [facility] 6:20 PM

## 2022-04-03 NOTE — Anesthesia Preprocedure Evaluation (Signed)
Anesthesia Evaluation  Patient identified by MRN, date of birth, ID band Patient awake    Reviewed: Allergy & Precautions, NPO status , Patient's Chart, lab work & pertinent test results, reviewed documented beta blocker date and time   Airway Mallampati: II  TM Distance: >3 FB Neck ROM: Full    Dental no notable dental hx. (+) Teeth Intact   Pulmonary asthma ,    Pulmonary exam normal breath sounds clear to auscultation       Cardiovascular hypertension, Pt. on medications and Pt. on home beta blockers Normal cardiovascular exam Rhythm:Regular Rate:Normal     Neuro/Psych PSYCHIATRIC DISORDERS Anxiety Depression Idiopathic intracranial HTN    GI/Hepatic Neg liver ROS, GERD  Medicated,  Endo/Other  Hypothyroidism Morbid obesity  Renal/GU negative Renal ROS  negative genitourinary   Musculoskeletal  (+) Arthritis , Osteoarthritis,    Abdominal (+) + obese,   Peds  Hematology  (+) Blood dyscrasia, anemia ,   Anesthesia Other Findings   Reproductive/Obstetrics (+) Pregnancy Gestational HTN                             Anesthesia Physical Anesthesia Plan  ASA: 3  Anesthesia Plan: Epidural   Post-op Pain Management: Minimal or no pain anticipated   Induction: Intravenous  PONV Risk Score and Plan: Treatment may vary due to age or medical condition  Airway Management Planned: Natural Airway  Additional Equipment: None  Intra-op Plan:   Post-operative Plan:   Informed Consent: I have reviewed the patients History and Physical, chart, labs and discussed the procedure including the risks, benefits and alternatives for the proposed anesthesia with the patient or authorized representative who has indicated his/her understanding and acceptance.       Plan Discussed with: Anesthesiologist  Anesthesia Plan Comments:         Anesthesia Quick Evaluation

## 2022-04-03 NOTE — H&P (Addendum)
OBSTETRIC ADMISSION HISTORY AND PHYSICAL  Marcia Gonzalez is a 26 y.o. female G3P0020 with IUP at [redacted]w[redacted]d presenting for IOL due to [facility]. She reports +FMs. No LOF, VB, blurry vision, headaches, peripheral edema, or RUQ pain. She plans on breast feeding. She is planning on condoms for birth control  Dating: By [place] --->  Estimated Date of Delivery: 04/22/22  Sono:    @[redacted]w[redacted]d , normal anatomy, posterior placenta, Cephalic presentation, 2974g, , EFW    Prenatal History/Complications: Anxiety and Depression. Chronic HTN - Labetolol BID. Hypothyroidism - Previously on synthroid.  Past Medical History: Past Medical History:  Diagnosis Date   Allergy    Anemia    Anxiety    Arthritis    Asthma    Depression    Endometriosis    GERD (gastroesophageal reflux disease)    Hypertension    gestational   Hypothyroidism    IIH (idiopathic intracranial hypertension)    Pregnancy induced hypertension    UTI (urinary tract infection)    Wells' syndrome     Past Surgical History: Past Surgical History:  Procedure Laterality Date   ABDOMINAL EXPLORATION SURGERY  04/2021   COLONOSCOPY     DILATION AND CURETTAGE OF UTERUS     FINGER SURGERY     LAPAROTOMY     LUMBAR PUNCTURE      Obstetrical History: OB History     Gravida  3   Para  0   Term  0   Preterm  0   AB  2   Living  0      SAB  2   IAB  0   Ectopic  0   Multiple  0   Live Births  0           Social History: Social History   Socioeconomic History   Marital status: Married    Spouse name: 05/2021   Number of children: 0   Years of education: 12   Highest education level: High school graduate  Occupational History   Not on file  Tobacco Use   Smoking status: Never    Passive exposure: Never   Smokeless tobacco: Never  Vaping Use   Vaping Use: Never used  Substance and Sexual Activity   Alcohol use: Not Currently   Drug use: Never   Sexual activity: Yes    Birth control/protection:  None  Other Topics Concern   Not on file  Social History Narrative   Not on file   Social Determinants of Health   Financial Resource Strain: Low Risk  (03/26/2022)   Overall Financial Resource Strain (CARDIA)    Difficulty of Paying Living Expenses: Not very hard  Food Insecurity: No Food Insecurity (03/26/2022)   Hunger Vital Sign    Worried About Running Out of Food in the Last Year: Never true    Ran Out of Food in the Last Year: Never true  Transportation Needs: No Transportation Needs (03/26/2022)   PRAPARE - 05/26/2022 (Medical): No    Lack of Transportation (Non-Medical): No  Physical Activity: Insufficiently Active (03/26/2022)   Exercise Vital Sign    Days of Exercise per Week: 2 days    Minutes of Exercise per Session: 20 min  Stress: No Stress Concern Present (03/26/2022)   05/26/2022 of Occupational Health - Occupational Stress Questionnaire    Feeling of Stress : Not at all  Social Connections: Socially Integrated (10/21/2021)   Social Connection and Isolation Panel [NHANES]  Frequency of Communication with Friends and Family: More than three times a week    Frequency of Social Gatherings with Friends and Family: Three times a week    Attends Religious Services: More than 4 times per year    Active Member of Clubs or Organizations: Yes    Attends Engineer, structural: More than 4 times per year    Marital Status: Married    Family History: Family History  Problem Relation Age of Onset   Kidney disease Mother    Hypertension Mother    Hyperlipidemia Mother    Depression Mother    Cancer Mother        cervical   Arthritis Mother    Anxiety disorder Mother    Depression Father    Anxiety disorder Father    Asthma Brother    COPD Maternal Grandmother    Arthritis Maternal Grandmother     Allergies: Allergies  Allergen Reactions   Codeine Nausea Only   Augmentin [Amoxicillin-Pot Clavulanate] Other (See Comments)     Hallucinations   Misoprostol     Left blisters in mouth   Oxycodone Hives and Itching    Tylenol 3   Other Rash    Latex tape, causes reaction and blisters.    Medications Prior to Admission  Medication Sig Dispense Refill Last Dose   acetaminophen (TYLENOL) 500 MG tablet Take 1,000 mg by mouth every 6 (six) hours as needed.      aspirin 81 MG EC tablet Take 1 tablet (81 mg total) by mouth daily. Swallow whole. 90 tablet 3    buPROPion (WELLBUTRIN XL) 150 MG 24 hr tablet Take 1 tablet (150 mg total) by mouth daily. 90 tablet 4    busPIRone (BUSPAR) 5 MG tablet Take 1 tablet (5 mg total) by mouth 3 (three) times daily. 90 tablet 3    cyclobenzaprine (FLEXERIL) 10 MG tablet Take 1 tablet (10 mg total) by mouth 3 (three) times daily as needed. 30 tablet 1    docusate sodium (COLACE) 100 MG capsule Take 1 capsule (100 mg total) by mouth 2 (two) times daily as needed. (Patient not taking: Reported on 04/02/2022) 30 capsule 2    escitalopram (LEXAPRO) 20 MG tablet Take 1 tablet (20 mg total) by mouth daily. (NEEDS TO BE SEEN BEFORE NEXT REFILL) 90 tablet 4    ferrous sulfate 325 (65 FE) MG tablet Take 325 mg by mouth daily with breakfast.      folic acid (FOLVITE) 1 MG tablet Take 1 mg by mouth daily.      labetalol (NORMODYNE) 200 MG tablet Take 1 tablet (200 mg total) by mouth 2 (two) times daily. 60 tablet 3    levothyroxine (SYNTHROID) 88 MCG tablet Take 1 tablet (88 mcg total) by mouth daily before breakfast. 90 tablet 4    omeprazole (PRILOSEC) 40 MG capsule Take 1 capsule (40 mg total) by mouth daily. 30 capsule 3    ondansetron (ZOFRAN-ODT) 4 MG disintegrating tablet Take 1 tablet (4 mg total) by mouth every 8 (eight) hours as needed for nausea or vomiting. 30 tablet 2    Prenatal Vit-Fe Fumarate-FA (PRENATAL MULTIVITAMIN) TABS tablet Take 1 tablet by mouth daily at 12 noon.      promethazine (PHENERGAN) 12.5 MG tablet Take 1 tablet (12.5 mg total) by mouth every 6 (six) hours as needed  for nausea or vomiting. (Patient not taking: Reported on 04/02/2022) 30 tablet 0      Review of Systems:  All systems reviewed and negative except as stated in HPI  PE: Blood pressure (!) 118/59, pulse 77, temperature 97.8 F (36.6 C), temperature source Oral, resp. rate 16, height 5\' 5"  (1.651 m), weight 108.9 kg, last menstrual period 07/22/2021, unknown if currently breastfeeding. General appearance: alert, cooperative, appears stated age, and no distress Lungs: regular rate and effort Heart: regular rate  Abdomen: soft, non-tender Extremities: Homans sign is negative, no sign of DVT Presentation: cephalic EFM: 135 bpm, moderate variability, + accels, - decels Toco: occasional Dilation: Closed Effacement (%): Thick Station: -2 Exam by:: [email or web address], RN SVE: n/a  Prenatal labs: ABO, Rh: --/--/O POS (09/15 0749) Antibody: NEG (09/15 0749) Rubella: 1.80 (04/04 1619) RPR: Non Reactive (07/12 0957)  HBsAg: Negative (04/04 1619)  HIV: Non Reactive (07/12 0957)  GBS: Negative/-- (09/07 1424)  2 hr GTT negative  Prenatal Transfer Tool  Maternal Diabetes: No Genetic Screening: Panorama: Low risk female Maternal Ultrasounds/Referrals: Normal Fetal Ultrasounds or other Referrals:  Referred to Materal Fetal Medicine  Maternal Substance Abuse:  No Significant Maternal Medications:  Synthroid, Wellbutrin, Lexapro Significant Maternal Lab Results: Group B Strep negative  Results for orders placed or performed during the hospital encounter of 04/03/22 (from the past 24 hour(s))  Type and screen   Collection Time: 04/03/22  7:49 AM  Result Value Ref Range   ABO/RH(D) O POS    Antibody Screen NEG    Sample Expiration      04/06/2022,2359 Performed at [facility], [address], [place], [place]   CBC   Collection Time: 04/03/22  7:51 AM  Result Value Ref Range   WBC 8.7 4.0 - 10.5 K/uL   RBC 3.60 (L) 3.87 - 5.11 MIL/uL   Hemoglobin 11.2 (L) 12.0 - 15.0 g/dL   HCT  04/05/22 (L) 68.0 - 46.0 %   MCV 89.2 80.0 - 100.0 fL   MCH 31.1 26.0 - 34.0 pg   MCHC 34.9 30.0 - 36.0 g/dL   RDW 32.1 22.4 - 82.5 %   Platelets 261 150 - 400 K/uL   nRBC 0.0 0.0 - 0.2 %    Patient Active Problem List   Diagnosis Date Noted   Chronic hypertension affecting pregnancy 12/03/2021   IIH (idiopathic intracranial hypertension) 10/21/2021   Encounter for supervision of high risk pregnancy in second trimester, antepartum 10/21/2021   Depression with anxiety 10/21/2021   Anemia 06/30/2021   Class 2 severe obesity due to excess calories with serious comorbidity and body mass index (BMI) of 39.0 to 39.9 in adult [facility]) 06/30/2021   Hashimoto's thyroiditis 10/16/2011    Assessment: Gracieann Stannard is a 26 y.o. G3P0020 at [redacted]w[redacted]d here for IOL due to [redacted]w[redacted]d.  1. Labor: Expectant management IOL. Patient started with Cytotec 25/39mcg orall and vaginally. Recheck cervical exam for possible foley bulb in 4 hours. 2. FWB: Cat 1 3. Pain: Planning epidural 4. GBS: negative 5. cHTN: Continue Labetalol 200mg  BID 6. MDD/GAD: Continue home Lexapro, Wellbutrin, and Buspar 7. Hypothyroidism: Continue home Synthroid.   Plan: Admit to Labor and Delivery.  32m, MD  04/03/2022, 8:58 AM  Fellow Attestation  I saw and evaluated the patient, performing the key elements of the service.I  personally performed or re-performed the history, physical exam, and medical decision making activities of this service and have verified that the service and findings are accurately documented in the resident's note. I developed the management plan that is described in the resident's note, and I agree with the  content, with my edits above.    Derrel Nip, MD OB Fellow

## 2022-04-03 NOTE — Progress Notes (Signed)
Marcia Gonzalez is a 26 y.o. G3P0020 at [redacted]w[redacted]d admitted for IOL for [facility]  Subjective: Pt feeling painful contractions.  Desires epidural.   Objective: BP 118/69   Pulse 87   Temp 98.3 F (36.8 C) (Oral)   Resp 16   Ht 5\' 5"  (1.651 m)   Wt 108.9 kg   LMP 07/22/2021 (Exact Date)   BMI 39.94 kg/m  No intake/output data recorded. No intake/output data recorded.  FHT:  FHR: 135 bpm, variability: moderate,  accelerations:  Present,  decelerations:  Absent UC:   irregular, every 2-6 minutes SVE:   Dilation: 2 Effacement (%): 50 Station: -2 Exam by:: Davis,RN  Labs: Lab Results  Component Value Date   WBC 8.7 04/03/2022   HGB 11.2 (L) 04/03/2022   HCT 32.1 (L) 04/03/2022   MCV 89.2 04/03/2022   PLT 261 04/03/2022    Assessment / Plan: IOL for CHTN with secondary hypthyroid, anxiety/depression  Labor:  Foley balloon out per RN, pt desires epidural, plan to check and consider AROM after pt comfortable with epidural.  Discussed options with pt and pt desires AROM if possible, after epidural. Preeclampsia:  labs stable Fetal Wellbeing:  Category I Pain Control:  IV pain meds I/D:   GBS  neg Anticipated MOD:  NSVD  04/05/2022, CNM 04/03/2022, 4:58 PM

## 2022-04-03 NOTE — Progress Notes (Signed)
Labor Progress Note Marcia Gonzalez is a 26 y.o. G3P0020 at [redacted]w[redacted]d presented for IOL due to cHTN  S: Patient is resting comfortably, epidural in place.  O:  BP 121/68   Pulse 71   Temp 98.2 F (36.8 C) (Oral)   Resp 16   Ht 5\' 5"  (1.651 m)   Wt 108.9 kg   LMP 07/22/2021 (Exact Date)   BMI 39.94 kg/m  EFM: 135/moderate variability/ difficult tracing, possible variables with contractions  CVE: Dilation: 6 Effacement (%): 70 Cervical Position: Middle Station: -2 Presentation: Vertex Exam by:: Dr. [email or web address]   A&P: 26 y.o. 30 [redacted]w[redacted]d here for IOL due to Chronic HTN. #Labor: s/p FB, AROM, IUPC and now FSE. Amnioinfusion started 2/2 repetitive deep variables. Do not start pitocin at this time. Continue to monitor fetal heart rate.  #Pain: Epidural in place. #FWB: Cat II, FSE placed amnio infusion started #GBS negative #cHTN: Continue Labetalol 200mg  BID, Pre-e labs negative.  [redacted]w[redacted]d, DO [facility] for [facility] 9:48 PM

## 2022-04-03 NOTE — Progress Notes (Signed)
Labor Progress Note Marcia Gonzalez is a 26 y.o. G3P0020 at [redacted]w[redacted]d presented for IOL due to [place]. S: Patient is resting comfortably.  O:  BP (!) 134/48   Pulse 75   Temp 97.8 F (36.6 C) (Oral)   Resp 16   Ht 5\' 5"  (1.651 m)   Wt 108.9 kg   LMP 07/22/2021 (Exact Date)   BMI 39.94 kg/m  EFM: 135/moderate variability/accels present  CVE: Dilation: Closed Effacement (%): Thick Station: -2 Presentation: Vertex Exam by:: [email or web address], RN   A&P: 26 y.o. 30 [redacted]w[redacted]d here for IOL due to cHTN. #Labor: Progressing well. Will do additional dose of [redacted]w[redacted]d of oral Cytotec. Foley bulb placed. #Pain: Planning for epidural #FWB: Cat 1 #GBS negative #cHTN: Continue Labetalol 200mg  BID #MDD/GAD: Continue home Lexapro, Wellbutrin, and Buspar #Hypothyroidism: Continue home Synthroid.  , MD, PGY-1 [facility] for [facility] 12:48 PM

## 2022-04-03 NOTE — Anesthesia Procedure Notes (Signed)
Epidural Patient location during procedure: OB Start time: 04/03/2022 4:46 PM End time: 04/03/2022 4:57 PM  Staffing Anesthesiologist: Mal Amabile, MD Performed: anesthesiologist   Preanesthetic Checklist Completed: patient identified, IV checked, site marked, risks and benefits discussed, surgical consent, monitors and equipment checked, pre-op evaluation and timeout performed  Epidural Patient position: sitting Prep: DuraPrep and site prepped and draped Patient monitoring: continuous pulse ox and blood pressure Approach: midline Location: L3-L4 Injection technique: LOR air  Needle:  Needle type: Tuohy  Needle gauge: 17 G Needle length: 9 cm and 9 Needle insertion depth: 7 cm Catheter type: closed end flexible Catheter size: 19 Gauge Catheter at skin depth: 12 cm Test dose: negative and Other  Assessment Events: blood not aspirated, injection not painful, no injection resistance, no paresthesia and negative IV test  Additional Notes Patient identified. Risks and benefits discussed including failed block, incomplete  Pain control, post dural puncture headache, nerve damage, paralysis, blood pressure Changes, nausea, vomiting, reactions to medications-both toxic and allergic and post Partum back pain. All questions were answered. Patient expressed understanding and wished to proceed. Sterile technique was used throughout procedure. Epidural site was Dressed with sterile barrier dressing. No paresthesias, signs of intravascular injection Or signs of intrathecal spread were encountered.  Patient was more comfortable after the epidural was dosed. Technically difficult due to poor positioning. Attempt x 3 Please see RN's note for documentation of vital signs and FHR which are stable. Reason for block:procedure for pain

## 2022-04-04 ENCOUNTER — Encounter (HOSPITAL_COMMUNITY): Payer: Self-pay | Admitting: Obstetrics & Gynecology

## 2022-04-04 DIAGNOSIS — O1002 Pre-existing essential hypertension complicating childbirth: Secondary | ICD-10-CM

## 2022-04-04 DIAGNOSIS — Z3A37 37 weeks gestation of pregnancy: Secondary | ICD-10-CM

## 2022-04-04 DIAGNOSIS — O99284 Endocrine, nutritional and metabolic diseases complicating childbirth: Secondary | ICD-10-CM

## 2022-04-04 MED ORDER — OXYCODONE HCL 5 MG PO TABS
5.0000 mg | ORAL_TABLET | Freq: Once | ORAL | Status: AC
  Administered 2022-04-04: 5 mg via ORAL
  Filled 2022-04-04: qty 1

## 2022-04-04 MED ORDER — ONDANSETRON HCL 4 MG/2ML IJ SOLN: 4.0000 mg | INTRAMUSCULAR | Status: DC | PRN

## 2022-04-04 MED ORDER — TRANEXAMIC ACID-NACL 1000-0.7 MG/100ML-% IV SOLN
INTRAVENOUS | Status: AC
  Administered 2022-04-04: 1000 mg via INTRAVENOUS
  Filled 2022-04-04: qty 100

## 2022-04-04 MED ORDER — TETANUS-DIPHTH-ACELL PERTUSSIS 5-2.5-18.5 LF-MCG/0.5 IM SUSY: 0.5000 mL | PREFILLED_SYRINGE | Freq: Once | INTRAMUSCULAR | Status: DC

## 2022-04-04 MED ORDER — ONDANSETRON HCL 4 MG PO TABS: 4.0000 mg | ORAL_TABLET | ORAL | Status: DC | PRN

## 2022-04-04 MED ORDER — ACETAMINOPHEN 325 MG PO TABS
650.0000 mg | ORAL_TABLET | ORAL | Status: DC | PRN
  Administered 2022-04-04 – 2022-04-05 (×3): 650 mg via ORAL
  Filled 2022-04-04 (×3): qty 2

## 2022-04-04 MED ORDER — BENZOCAINE-MENTHOL 20-0.5 % EX AERO
1.0000 | INHALATION_SPRAY | CUTANEOUS | Status: DC | PRN
  Administered 2022-04-05 – 2022-04-06 (×2): 1 via TOPICAL
  Filled 2022-04-04 (×3): qty 56

## 2022-04-04 MED ORDER — DIPHENHYDRAMINE HCL 25 MG PO CAPS: 25.0000 mg | ORAL_CAPSULE | Freq: Four times a day (QID) | ORAL | Status: DC | PRN

## 2022-04-04 MED ORDER — COCONUT OIL OIL: 1.0000 | TOPICAL_OIL | Status: DC | PRN

## 2022-04-04 MED ORDER — TRANEXAMIC ACID-NACL 1000-0.7 MG/100ML-% IV SOLN: 1000.0000 mg | INTRAVENOUS | Status: AC

## 2022-04-04 MED ORDER — ZOLPIDEM TARTRATE 5 MG PO TABS: 5.0000 mg | ORAL_TABLET | Freq: Every evening | ORAL | Status: DC | PRN

## 2022-04-04 MED ORDER — DIBUCAINE (PERIANAL) 1 % EX OINT: 1.0000 | TOPICAL_OINTMENT | CUTANEOUS | Status: DC | PRN

## 2022-04-04 MED ORDER — WITCH HAZEL-GLYCERIN EX PADS
1.0000 | MEDICATED_PAD | CUTANEOUS | Status: DC | PRN
  Administered 2022-04-04: 1 via TOPICAL

## 2022-04-04 MED ORDER — IBUPROFEN 600 MG PO TABS
600.0000 mg | ORAL_TABLET | Freq: Four times a day (QID) | ORAL | Status: DC
  Administered 2022-04-04 – 2022-04-06 (×8): 600 mg via ORAL
  Filled 2022-04-04 (×8): qty 1

## 2022-04-04 MED ORDER — TERBUTALINE SULFATE 1 MG/ML IJ SOLN: 0.2500 mg | Freq: Once | INTRAMUSCULAR | Status: DC | PRN

## 2022-04-04 MED ORDER — SENNOSIDES-DOCUSATE SODIUM 8.6-50 MG PO TABS
2.0000 | ORAL_TABLET | Freq: Every day | ORAL | Status: DC
  Administered 2022-04-05 – 2022-04-06 (×2): 2 via ORAL
  Filled 2022-04-04 (×2): qty 2

## 2022-04-04 MED ORDER — SIMETHICONE 80 MG PO CHEW: 80.0000 mg | CHEWABLE_TABLET | ORAL | Status: DC | PRN

## 2022-04-04 MED ORDER — PRENATAL MULTIVITAMIN CH
1.0000 | ORAL_TABLET | Freq: Every day | ORAL | Status: DC
  Administered 2022-04-05 – 2022-04-06 (×2): 1 via ORAL
  Filled 2022-04-04 (×2): qty 1

## 2022-04-04 MED ORDER — FAMOTIDINE IN NACL 20-0.9 MG/50ML-% IV SOLN
20.0000 mg | Freq: Once | INTRAVENOUS | Status: AC
  Administered 2022-04-04: 20 mg via INTRAVENOUS
  Filled 2022-04-04: qty 50

## 2022-04-04 MED ORDER — OXYTOCIN-SODIUM CHLORIDE 30-0.9 UT/500ML-% IV SOLN
1.0000 m[IU]/min | INTRAVENOUS | Status: DC
  Administered 2022-04-04: 2 m[IU]/min via INTRAVENOUS

## 2022-04-04 NOTE — Lactation Note (Signed)
This note was copied from a baby's chart. Lactation Consultation Note  Patient Name: Marcia Gonzalez [id number] Date: 04/04/2022 Reason for consult: Follow-up assessment;Mother's request;Difficult latch;Primapara;1st time breastfeeding;Early term 37-38.6wks;Breastfeeding assistance;Maternal endocrine disorder Age:24 hours  P1, Early term, Infant Female  LC entered the room and the birth parent was attempting to latch the infant to the left breast in cross-cradle.  Infant's body was facing the ceiling and his head was turned to the side.  LC adjusted the position of the infant and assisted the parent in using a tea cup hold to assist the infant with latching.  The infant was off and on the breast.  LC rolled the nipple and the infant was able to stay on for a few sucks and came off.  LC switched the infant to the right breast in the football position and rolled the nipple.  The infant latched and stayed on for 5 min.  LC gave the birth parent breast shells to assist with the areolar edema and to evert the nipple. She is aware to wear the breast shells when she is awake in her bra, rinse the white sponges with cool water, not to feed the infant milk that collects in the shells, toss the white sponge after 24 hours, and remove the shells when she is asleep.  The birth parent had no further questions.  The birth parent will call RN/LC for assistance with breastfeeding.    Maternal Data    Feeding Mother's Current Feeding Choice: Breast Milk  LATCH Score Latch: Repeated attempts needed to sustain latch, nipple held in mouth throughout feeding, stimulation needed to elicit sucking reflex.  Audible Swallowing: A few with stimulation  Type of Nipple: Flat  Comfort (Breast/Nipple): Soft / non-tender  Hold (Positioning): Assistance needed to correctly position infant at breast and maintain latch.  LATCH Score: 6   Lactation Tools Discussed/Used Tools:  Shells  Interventions Interventions: Assisted with latch;Adjust position  Discharge    Consult Status Consult Status: Follow-up Date: 04/05/22 Follow-up type: In-patient    Lysbeth Penner 04/04/2022, 7:16 PM

## 2022-04-04 NOTE — Discharge Summary (Signed)
Postpartum Discharge Summary  Date of Service updated- yes     Patient Name: Marcia Gonzalez DOB: Oct 09, 1995 MRN: [id number]  Date of admission: 04/03/2022 Delivery date:04/04/2022  Delivering provider: Concepcion Living  Date of discharge: 04/05/2022  Admitting diagnosis: Chronic hypertension affecting pregnancy [O10.919] Intrauterine pregnancy: [redacted]w[redacted]d    Secondary diagnosis:  Principal Problem:   Chronic hypertension affecting pregnancy Active Problems:   Vaginal delivery  Additional problems: hypothyroidism, anxiety and depression    Discharge diagnosis: Term Pregnancy Delivered and CHTN                                              Post partum procedures: none Augmentation: AROM, Pitocin, Cytotec, and IP Foley Complications: None  Hospital course: Induction of Labor With Vaginal Delivery   26y.o. yo G3P0020 at 24w3das admitted to the hospital 04/03/2022 for induction of labor.  Indication for induction:  Elevated cord doppler ratio and cHTN .  Patient had an uncomplicated labor course as follows: Membrane Rupture Time/Date: 6:25 PM ,04/03/2022   Delivery Method:Vaginal, Spontaneous  Episiotomy: None  Lacerations:  1st degree;Periurethral  Details of delivery can be found in separate delivery note.  Patient had a routine postpartum course. Blood pressures stable. Patient is discharged home 04/05/22.  Newborn Data: Birth date:04/04/2022  Birth time:10:55 AM  Gender:Female  Living status:Living  Apgars:5 ,6  Weight:3110 g   Magnesium Sulfate received: No BMZ received: No Rhophylac:N/A MMR: immune T-DaP:Given prenatally Flu: No Transfusion:No  Physical exam  Vitals:   04/04/22 1445 04/04/22 1822 04/04/22 2200 04/05/22 0300  BP: 136/62 125/62 129/72 118/66  Pulse: 74 93 97   Resp:  '18 18 20  ' Temp: 97.6 F (36.4 C) 99.1 F (37.3 C) 98.6 F (37 C) 98.5 F (36.9 C)  TempSrc: Oral Oral Axillary Axillary  SpO2: 97%  99%   Weight:      Height:       Physical  exam:  Please see exam finding on Dr Autry-Lott's progress note dated 04/05/2022   Labs: Lab Results  Component Value Date   WBC 9.6 04/05/2022   HGB 9.3 (L) 04/05/2022   HCT 26.3 (L) 04/05/2022   MCV 88.3 04/05/2022   PLT 187 04/05/2022      Latest Ref Rng & Units 04/03/2022    7:51 AM  CMP  Glucose 70 - 99 mg/dL 108   BUN 6 - 20 mg/dL 8   Creatinine 0.44 - 1.00 mg/dL 0.56   Sodium 135 - 145 mmol/L 136   Potassium 3.5 - 5.1 mmol/L 3.7   Chloride 98 - 111 mmol/L 108   CO2 22 - 32 mmol/L 18   Calcium 8.9 - 10.3 mg/dL 8.8   Total Protein 6.5 - 8.1 g/dL 5.7   Total Bilirubin 0.3 - 1.2 mg/dL 0.5   Alkaline Phos 38 - 126 U/L 117   AST 15 - 41 U/L 23   ALT 0 - 44 U/L 14    Edinburgh Score:    04/04/2022   10:00 PM  Edinburgh Postnatal Depression Scale Screening Tool  I have been able to laugh and see the funny side of things. 0  I have looked forward with enjoyment to things. 0  I have blamed myself unnecessarily when things went wrong. 2  I have been anxious or worried for no good reason. 2  I have felt scared or panicky for no good reason. 2  Things have been getting on top of me. 1  I have been so unhappy that I have had difficulty sleeping. 0  I have felt sad or miserable. 0  I have been so unhappy that I have been crying. 1  The thought of harming myself has occurred to me. 0  Edinburgh Postnatal Depression Scale Total 8     After visit meds:  Allergies as of 04/05/2022       Reactions   Codeine Nausea Only   Augmentin [amoxicillin-pot Clavulanate] Other (See Comments)   Hallucinations   Misoprostol    Left blisters in mouth   Oxycodone Hives, Itching   Tylenol 3   Other Rash   Latex tape, causes reaction and blisters.        Medication List     STOP taking these medications    acetaminophen 500 MG tablet Commonly known as: TYLENOL   aspirin EC 81 MG tablet   cyclobenzaprine 10 MG tablet Commonly known as: FLEXERIL   docusate sodium 100 MG  capsule Commonly known as: COLACE   folic acid 1 MG tablet Commonly known as: FOLVITE   omeprazole 40 MG capsule Commonly known as: PRILOSEC   ondansetron 4 MG disintegrating tablet Commonly known as: ZOFRAN-ODT   promethazine 12.5 MG tablet Commonly known as: PHENERGAN       TAKE these medications    buPROPion 150 MG 24 hr tablet Commonly known as: WELLBUTRIN XL Take 1 tablet (150 mg total) by mouth daily.   busPIRone 5 MG tablet Commonly known as: BUSPAR Take 1 tablet (5 mg total) by mouth 3 (three) times daily.   escitalopram 20 MG tablet Commonly known as: LEXAPRO Take 1 tablet (20 mg total) by mouth daily. (NEEDS TO BE SEEN BEFORE NEXT REFILL)   ferrous sulfate 325 (65 FE) MG tablet Take 325 mg by mouth daily with breakfast.   furosemide 20 MG tablet Commonly known as: Lasix Take 1 tablet (20 mg total) by mouth daily for 5 days.   labetalol 200 MG tablet Commonly known as: NORMODYNE Take 1 tablet (200 mg total) by mouth 2 (two) times daily.   levothyroxine 88 MCG tablet Commonly known as: Synthroid Take 1 tablet (88 mcg total) by mouth daily before breakfast.   prenatal multivitamin Tabs tablet Take 1 tablet by mouth daily at 12 noon.         Discharge home in stable condition Infant Feeding: Breast Infant Disposition:home with mother Discharge instruction: per After Visit Summary and Postpartum booklet. Activity: Advance as tolerated. Pelvic rest for 6 weeks.  Diet: low salt diet Future Appointments: Future Appointments  Date Time Provider [place]  04/10/2022 12:50 PM [facility] NURSE [facility]  05/11/2022  2:10 PM Roma Schanz, CNM [facility]   Follow up Visit:  Message sent 04/04/22  Please schedule this patient for a In person postpartum visit in 4 weeks with the following provider: Any provider. Additional Postpartum F/U:Postpartum Depression checkup and BP check 1 week  High risk pregnancy complicated by:  HTN Delivery mode:  Vaginal, Spontaneous  Anticipated Birth Control:  Condoms   Liliane Channel MD MPH OB Fellow, [place] [facility] [place] 04/05/2022

## 2022-04-04 NOTE — Lactation Note (Signed)
This note was copied from a baby's chart. Lactation Consultation Note  Patient Name: Marcia Gonzalez [id number] Date: 04/04/2022 Reason for consult: Initial assessment;Primapara;1st time breastfeeding;Early term 37-38.6wks (As LC entered the room family member holding the baby, and birth parent resting in bed with support person. Per Birth parent, attempted to latch at 1:30 and baby was not interested by the [facility]. LC encouraged mom to call with feeding cues.) Age:65 hours LC reviewed potential feeding behaviors of early term infant and feeding goals in 24 hours. Feed with feeding cues and by 3 hours if the baby isn't showing feeding cues, STS, spoon feed for calories.  Birth parent aware to call for [facility] or RN for feeding assessment.   Maternal Data Has patient been taught Hand Expression?:  (per mom has been able to hand express and the next attempt to latch will do so. LC enc to call.)  Feeding Mother's Current Feeding Choice: Breast Milk  LATCH Score  Lactation Tools Discussed/Used    Interventions    Discharge Pump: DEBP;Personal;Hands Free (per Birth parent has the regular DEBP and the hand- free one.)  Consult Status Consult Status: Follow-up Date: 04/04/22 Follow-up type: In-patient    [place] 04/04/2022, 3:30 PM

## 2022-04-04 NOTE — Lactation Note (Signed)
This note was copied from a baby's chart. Late entry- Lactation note- Baby was code apgar, LD mentioned baby was resting on mom's chest, and mom was getting repaired.

## 2022-04-04 NOTE — Progress Notes (Signed)
Labor Progress Note Marcia Gonzalez is a 26 y.o. G3P0020 at [redacted]w[redacted]d presented for IOL due to cHTN  S: Patient is resting comfortably, epidural in place. Experiencing headache and heartburn at this time.   O:  BP (!) 103/43   Pulse 69   Temp (!) 96.4 F (35.8 C) (Axillary)   Resp 17   Ht 5\' 5"  (1.651 m)   Wt 108.9 kg   LMP 07/22/2021 (Exact Date)   SpO2 95%   BMI 39.94 kg/m  EFM: 135/moderate variability/early decels CVE: Dilation: 9 Effacement (%): 100 Cervical Position: Middle Station: -2 Presentation: Vertex Exam by:: Dr. Janus Molder   A&P: 26 y.o. [id number] [redacted]w[redacted]d here for IOL due to Chronic HTN. #Labor: Progressing well. Expectantly manage from here.  #Pain: Epidural in place. #FWB: Cat I #GBS negative #cHTN: Continue Labetalol 200mg  BID, Pre-e labs negative. Continue to monitor headache [place] for heartburn  [place] for [place] 12:45 AM

## 2022-04-04 NOTE — Progress Notes (Signed)
Labor Progress Note Marcia Gonzalez is a 26 y.o. G3P0020 at [redacted]w[redacted]d presented for IOL due to cHTN  S: Patient is resting. No acute concerns.   O:  BP (!) 101/54   Pulse 91   Temp (!) 97.3 F (36.3 C) (Axillary)   Resp 15   Ht 5\' 5"  (1.651 m)   Wt 108.9 kg   LMP 07/22/2021 (Exact Date)   SpO2 95%   BMI 39.94 kg/m  EFM: 135/moderate variability/early decels CVE: Dilation: 9.5 Effacement (%): 100 Cervical Position: Middle Station: -1,0 Presentation: Vertex Exam by:: Dr. Janus Molder   A&P: 26 y.o. [id number] [redacted]w[redacted]d here for IOL due to Chronic HTN. #Labor: 9-9.5 cm for several hours. Start low dose pitocin. Expectantly manage from here.  #Pain: Epidural in place. #FWB: Cat I #GBS negative #cHTN: Continue Labetalol 200mg  BID  Gerlene Fee, DO [facility] [place] 7:00 AM

## 2022-04-04 NOTE — Lactation Note (Signed)
This note was copied from a baby's chart. Lactation Consultation Note  Patient Name: Marcia Gonzalez [id number] Date: 04/04/2022 Reason for consult: Follow-up assessment;Mother's request;Difficult latch;Primapara;1st time breastfeeding;Early term 37-38.6wks;Breastfeeding assistance Age:23 hours  P1, Early Term, Infant Female  LC entered the room and the baby was asleep in the bassinet.  Per the birth parent, the infant has been sleepy.  [place] asked if the birth parent had been taught how to hand express.  The birth parent states that she has been taught how to hand express.  LC encouraged the birth parent to hand express and feed the infant colostrum via a spoon if the infant is sleepy and not feeding.  The parents are aware that the infant takes in about 30mL per feed in the 1st 24 hours.  [place] talked about milk production and infant behavior.  LC reviewed feeding cues.  [place] asked the birth parent to call when the infant is ready to feed.  Current Feeding Plan:  Breastfeed 8+times in 24 hours according to feeding cues.  Hand express and spoon feed the expressed milk to the infant.  Call [place] for assistance with the latch.     Maternal Data Has patient been taught Hand Expression?:  (per mom has been able to hand express and the next attempt to latch will do so. LC enc to call.)  Feeding Mother's Current Feeding Choice: Breast Milk  LATCH Score                    Lactation Tools Discussed/Used    Interventions Interventions: Education  Discharge Pump: DEBP;Personal;Hands Free (per Birth parent has the regular DEBP and the hand- free one.)  Consult Status Consult Status: Follow-up Date: 04/05/22 Follow-up type: In-patient    Lysbeth Penner 04/04/2022, 6:35 PM

## 2022-04-05 LAB — GROUP A STREP BY PCR: Group A Strep by PCR: NOT DETECTED

## 2022-04-05 LAB — CBC
HCT: 26.3 % — ABNORMAL LOW (ref 36.0–46.0)
Hemoglobin: 9.3 g/dL — ABNORMAL LOW (ref 12.0–15.0)
MCH: 31.2 pg (ref 26.0–34.0)
MCHC: 35.4 g/dL (ref 30.0–36.0)
MCV: 88.3 fL (ref 80.0–100.0)
Platelets: 187 10*3/uL (ref 150–400)
RBC: 2.98 MIL/uL — ABNORMAL LOW (ref 3.87–5.11)
RDW: 12.8 % (ref 11.5–15.5)
WBC: 9.6 10*3/uL (ref 4.0–10.5)
nRBC: 0 % (ref 0.0–0.2)

## 2022-04-05 MED ORDER — ACETAMINOPHEN 325 MG PO TABS
650.0000 mg | ORAL_TABLET | ORAL | Status: DC
  Administered 2022-04-05 – 2022-04-06 (×6): 650 mg via ORAL
  Filled 2022-04-05 (×6): qty 2

## 2022-04-05 MED ORDER — OXYCODONE HCL 5 MG PO TABS
10.0000 mg | ORAL_TABLET | Freq: Once | ORAL | Status: AC | PRN
  Administered 2022-04-05: 10 mg via ORAL
  Filled 2022-04-05: qty 2

## 2022-04-05 MED ORDER — FUROSEMIDE 20 MG PO TABS: 20.0000 mg | ORAL_TABLET | Freq: Every day | ORAL | 0 refills | Status: DC

## 2022-04-05 NOTE — Progress Notes (Signed)
Patient requested to talk to nurse & charge nurse. She was anxious & verbalized frustration. She stated " I woke up & my baby is not beside the bed. There's some lady doing something & my baby was hooked up with strings. Nobody told me about any tests & that scared me" Provided emotional support & explained about weight & Tcb checks & approximate times. Charge nurse came & talked to patient as well.

## 2022-04-05 NOTE — Progress Notes (Signed)
POSTPARTUM PROGRESS NOTE  Post Partum Day 1  Subjective:  Marcia Gonzalez is a 27 y.o. P5T6144 s/p VD at [redacted]w[redacted]d.  She reports she is doing well. No acute events overnight. She denies any problems with ambulating, voiding or po intake. Denies nausea or vomiting.  Pain is poorly controlled.  Lochia is moderate.  Objective: Blood pressure 118/66, pulse 97, temperature 98.5 F (36.9 C), temperature source Axillary, resp. rate 20, height 5\' 5"  (1.651 m), weight 108.9 kg, last menstrual period 07/22/2021, SpO2 99 %, unknown if currently breastfeeding.  Physical Exam:  General: alert, cooperative and no distress Chest: no respiratory distress Heart:regular rate, distal pulses intact Abdomen: soft, nontender,  Uterine Fundus: firm, appropriately tender DVT Evaluation: No calf swelling or tenderness Extremities: No LE edema Skin: warm, dry  Recent Labs    04/03/22 0751 04/05/22 0450  HGB 11.2* 9.3*  HCT 32.1* 26.3*    Assessment/Plan: Marcia Gonzalez is a 27 y.o. R1V4008 s/p VD at [redacted]w[redacted]d   PPD#1 - Doing well  Routine postpartum care Consider 1x 10mg  oxy for pain. Would not continue. Scheduled tylenol and ibuprofen. Contraception: Condoms Feeding: Breast Dispo: Plan for discharge 9/18.   LOS: 2 days   Gerlene Fee, DO OB Fellow, [place] for [facility] 04/05/2022, 7:10 AM

## 2022-04-05 NOTE — Anesthesia Postprocedure Evaluation (Signed)
Anesthesia Post Note  Patient: Marcia Gonzalez  Procedure(s) Performed: AN AD HOC LABOR EPIDURAL     Patient location during evaluation: Mother Baby Anesthesia Type: Epidural Level of consciousness: awake and alert Pain management: pain level controlled Vital Signs Assessment: post-procedure vital signs reviewed and stable Respiratory status: spontaneous breathing, nonlabored ventilation and respiratory function stable Cardiovascular status: stable Postop Assessment: no headache, no backache, epidural receding, no apparent nausea or vomiting, adequate PO intake, patient able to bend at knees and able to ambulate Anesthetic complications: no   No notable events documented.  Last Vitals:  Vitals:   04/04/22 2200 04/05/22 0300  BP: 129/72 118/66  Pulse: 97   Resp: 18 20  Temp: 37 C 36.9 C  SpO2: 99%     Last Pain:  Vitals:   04/05/22 0545  TempSrc:   PainSc: 8    Pain Goal:                   Jabier Mutton

## 2022-04-05 NOTE — Progress Notes (Signed)
Discharge written this morning as an early discharge. Pediatricians keeping infant overnight for observation. Mother requests to cancel discharge due to her not having her medications with her. Called to notify first call L&D MD, who orders to cancel discharge. Maxwell Caul, Leretha Dykes [place]

## 2022-04-05 NOTE — Discharge Instructions (Addendum)
-   Continue your prenatal vitamins especially if breastfeeding - Try to eat iron rich food. - Take iron supplement as prescribed every other day. Take along with fruit or orange juice, avoid taking within 30 mins of eating dairy (milk, cheese) - Take over the counter tylenol (500mg) or ibuprofen (200mg) three times a day as needed for cramping/pain. - continue blood pressure medication. - Please come back to [facility] if you notice persistently elevated blood pressures or you start to have a headache, that doesn't get better with medications (tylenol and ibuprofen), rest (4hrs of sleep) and drinking water.  

## 2022-04-05 NOTE — Progress Notes (Signed)
Patient c/o of sore throat that started this evening. Throat has redness with white patches noted. Low grade fever. Notified MD on call with order to collect swab for strep throat. Sent to lab, awaiting result.

## 2022-04-05 NOTE — Progress Notes (Signed)
MOB was referred for history of depression/anxiety. * Referral screened out by Clinical Social Worker because none of the following criteria appear to apply: ~ History of anxiety/depression during this pregnancy, or of post-partum depression following prior delivery. ~ Diagnosis of anxiety and/or depression within last 3 years OR * MOB's symptoms currently being treated with medication and/or therapy. MOB has active prescriptions for Lexapro, Wellbutrin, and Buspar.   Please contact the Clinical Social Worker if needs arise, by [facility] request, or if MOB scores greater than 9/yes to question 10 on Edinburgh Postpartum Depression Screen.  Signed,  Berniece Salines, MSW, LCSWA, LCASA 04/05/2022 9:15 AM

## 2022-04-06 ENCOUNTER — Other Ambulatory Visit (HOSPITAL_COMMUNITY): Payer: Self-pay

## 2022-04-06 ENCOUNTER — Other Ambulatory Visit: Payer: BC Managed Care – PPO

## 2022-04-06 MED ORDER — COCONUT OIL OIL: 1.0000 | TOPICAL_OIL | 0 refills | Status: DC | PRN

## 2022-04-06 MED ORDER — IBUPROFEN 600 MG PO TABS
600.0000 mg | ORAL_TABLET | Freq: Four times a day (QID) | ORAL | 1 refills | Status: DC
  Filled 2022-04-06: qty 30, 8d supply, fill #0

## 2022-04-06 MED ORDER — ACETAMINOPHEN 325 MG PO TABS: 650.0000 mg | ORAL_TABLET | ORAL | Status: DC

## 2022-04-06 MED ORDER — FUROSEMIDE 20 MG PO TABS
20.0000 mg | ORAL_TABLET | Freq: Every day | ORAL | 0 refills | Status: DC
  Filled 2022-04-06: qty 5, 5d supply, fill #0

## 2022-04-06 MED ORDER — MAGIC MOUTHWASH
5.0000 mL | Freq: Three times a day (TID) | ORAL | 0 refills | Status: DC | PRN
  Filled 2022-04-06 (×2): qty 30, 2d supply, fill #0

## 2022-04-06 NOTE — Discharge Summary (Signed)
Postpartum Discharge Summary    Patient Name: Marcia Gonzalez DOB: [birth year] MRN: [id number]  Date of admission: 04/03/2022 Delivery date:04/04/2022  Delivering provider: Concepcion [facility]  Date of discharge: 04/06/2022  Admitting diagnosis: Chronic hypertension affecting pregnancy [O10.919] Intrauterine pregnancy: [redacted]w[redacted]d    Secondary diagnosis:  Principal Problem:   Chronic hypertension affecting pregnancy Active Problems:   Vaginal delivery  Additional problems: hypothyroidism, anxiety and depression    Discharge diagnosis: Term Pregnancy Delivered and CHTN                                              Post partum procedures: None Augmentation: AROM, Pitocin, Cytotec, and IP Foley Complications: None  Hospital course: Induction of Labor With Vaginal Delivery   26y.o. yo [id number] 334w3das admitted to the hospital 04/03/2022 for induction of labor.  Indication for induction:  elevated cord doppler ratio and chronic HTN .  Patient had an uncomplicated labor course as follows: Membrane Rupture Time/Date: 6:25 PM ,04/03/2022   Delivery Method:Vaginal, Spontaneous  Episiotomy: None  Lacerations:  1st degree;Periurethral  Details of delivery can be found in separate delivery note.  Patient had a routine postpartum course. Patient is discharged home 04/06/22.  Newborn Data: Birth date:04/04/2022  Birth time:10:55 AM  Gender:Female  Living status:Living  Apgars:5 ,6  Weight:3110 g   Magnesium Sulfate received: No BMZ received: No Rhophylac:N/A MMR:N/A T-DaP:Given prenatally Flu: No Transfusion:No  Physical exam  Vitals:   04/05/22 0300 04/05/22 1500 04/05/22 2122 04/06/22 0500  BP: 118/66 115/68 130/74 128/71  Pulse:  74 75 71  Resp: '20 18 18 16  ' Temp: 98.5 F (36.9 C) 98.2 F (36.8 C) 99.9 F (37.7 C) 98.4 F (36.9 C)  TempSrc: Axillary Oral Oral Oral  SpO2:  98% 98% 99%  Weight:      Height:       General: alert, cooperative, and no distress Lochia:  appropriate Uterine Fundus: firm Incision: N/A DVT Evaluation: No evidence of DVT seen on physical exam. No cords or calf tenderness. No significant calf/ankle edema. Labs: Lab Results  Component Value Date   WBC 9.6 04/05/2022   HGB 9.3 (L) 04/05/2022   HCT 26.3 (L) 04/05/2022   MCV 88.3 04/05/2022   PLT 187 04/05/2022      Latest Ref Rng & Units 04/03/2022    7:51 AM  CMP  Glucose 70 - 99 mg/dL 108   BUN 6 - 20 mg/dL 8   Creatinine 0.44 - 1.00 mg/dL 0.56   Sodium 135 - 145 mmol/L 136   Potassium 3.5 - 5.1 mmol/L 3.7   Chloride 98 - 111 mmol/L 108   CO2 22 - 32 mmol/L 18   Calcium 8.9 - 10.3 mg/dL 8.8   Total Protein 6.5 - 8.1 g/dL 5.7   Total Bilirubin 0.3 - 1.2 mg/dL 0.5   Alkaline Phos 38 - 126 U/L 117   AST 15 - 41 U/L 23   ALT 0 - 44 U/L 14    Edinburgh Score:    04/04/2022   10:00 PM  Edinburgh Postnatal Depression Scale Screening Tool  I have been able to laugh and see the funny side of things. 0  I have looked forward with enjoyment to things. 0  I have blamed myself unnecessarily when things went wrong. 2  I have been anxious or worried  for no good reason. 2  I have felt scared or panicky for no good reason. 2  Things have been getting on top of me. 1  I have been so unhappy that I have had difficulty sleeping. 0  I have felt sad or miserable. 0  I have been so unhappy that I have been crying. 1  The thought of harming myself has occurred to me. 0  Edinburgh Postnatal Depression Scale Total 8     After visit meds:  Allergies as of 04/06/2022       Reactions   Codeine Nausea Only   Augmentin [amoxicillin-pot Clavulanate] Other (See Comments)   Hallucinations   Misoprostol    Left blisters in mouth   Oxycodone Hives, Itching   Tylenol 3   Other Rash   Latex tape, causes reaction and blisters.        Medication List     STOP taking these medications    aspirin EC 81 MG tablet   cyclobenzaprine 10 MG tablet Commonly known as: FLEXERIL    docusate sodium 100 MG capsule Commonly known as: COLACE   folic acid 1 MG tablet Commonly known as: FOLVITE   omeprazole 40 MG capsule Commonly known as: PRILOSEC   ondansetron 4 MG disintegrating tablet Commonly known as: ZOFRAN-ODT   promethazine 12.5 MG tablet Commonly known as: PHENERGAN       TAKE these medications    acetaminophen 325 MG tablet Commonly known as: Tylenol Take 2 tablets (650 mg total) by mouth every 4 (four) hours. What changed:  medication strength how much to take when to take this reasons to take this   buPROPion 150 MG 24 hr tablet Commonly known as: WELLBUTRIN XL Take 1 tablet (150 mg total) by mouth daily.   busPIRone 5 MG tablet Commonly known as: BUSPAR Take 1 tablet (5 mg total) by mouth 3 (three) times daily.   coconut oil Oil Apply 1 Application topically as needed.   escitalopram 20 MG tablet Commonly known as: LEXAPRO Take 1 tablet (20 mg total) by mouth daily. (NEEDS TO BE SEEN BEFORE NEXT REFILL)   ferrous sulfate 325 (65 FE) MG tablet Take 325 mg by mouth daily with breakfast.   furosemide 20 MG tablet Commonly known as: Lasix Take 1 tablet (20 mg total) by mouth daily for 5 days.   ibuprofen 600 MG tablet Commonly known as: ADVIL Take 1 tablet (600 mg total) by mouth every 6 (six) hours.   labetalol 200 MG tablet Commonly known as: NORMODYNE Take 1 tablet (200 mg total) by mouth 2 (two) times daily.   levothyroxine 88 MCG tablet Commonly known as: Synthroid Take 1 tablet (88 mcg total) by mouth daily before breakfast.   prenatal multivitamin Tabs tablet Take 1 tablet by mouth daily at 12 noon.         Discharge home in stable condition Infant Feeding: Breast Infant Disposition:home with mother Discharge instruction: per After Visit Summary and Postpartum booklet. Activity: Advance as tolerated. Pelvic rest for 6 weeks.  Diet: low salt diet Future Appointments: Future Appointments  Date Time  Provider [place]  04/10/2022 12:50 PM [facility] NURSE [facility]  05/11/2022  2:10 PM Roma Schanz, CNM [facility]   Follow up Visit:  Follow-up Information     [facility] Follow up.   Specialty: Obstetrics and Gynecology Contact information: [address] [place] [id number]  Message sent 04/04/22   Please schedule this patient for a In person postpartum visit in 4 weeks with the following provider: Any provider. Additional Postpartum F/U:Postpartum Depression checkup and BP check 1 week  High risk pregnancy complicated by: HTN Delivery mode:  Vaginal, Spontaneous  Anticipated Birth Control:  Condoms   04/06/2022 Lenoria Chime, MD

## 2022-04-07 ENCOUNTER — Encounter: Payer: Self-pay | Admitting: Family Medicine

## 2022-04-07 ENCOUNTER — Ambulatory Visit (INDEPENDENT_AMBULATORY_CARE_PROVIDER_SITE_OTHER): Payer: BC Managed Care – PPO | Admitting: Family Medicine

## 2022-04-07 VITALS — BP 103/73 | HR 83 | Temp 97.7°F | Ht 65.0 in

## 2022-04-07 DIAGNOSIS — B370 Candidal stomatitis: Secondary | ICD-10-CM | POA: Diagnosis not present

## 2022-04-07 MED ORDER — NYSTATIN 100000 UNIT/ML MT SUSP: OROMUCOSAL | 0 refills | Status: DC

## 2022-04-07 NOTE — Progress Notes (Signed)
Subjective: CC:Marcia Gonzalez PCP: Gabriel Earing, FNP XTG:GYIRSWN [place] is a 26 y.o. female presenting to clinic today for:  1. Marcia Gonzalez reports some irritation and blistering of her mouth.  This started after she was given Misoprostol for induction of labor.  She was worried about thrush.  She was placed on Magic mouthwash at discharge from the hospital but notes that symptoms really have not gotten better and apparently this is on a backorder so she was not able to secure a prescription in the outpatient setting.     ROS: Per HPI  Allergies  Allergen Reactions   Codeine Nausea Only   Augmentin [Amoxicillin-Pot Clavulanate] Other (See Comments)    Hallucinations   Misoprostol     Left blisters in mouth   Oxycodone Hives and Itching    Tylenol 3   Other Rash    Latex tape, causes reaction and blisters.   Past Medical History:  Diagnosis Date   Allergy    Anemia    Anxiety    Arthritis    Asthma    Depression    Endometriosis    GERD (gastroesophageal reflux disease)    Hypertension    gestational   Hypothyroidism    IIH (idiopathic intracranial hypertension)    Pregnancy induced hypertension    UTI (urinary tract infection)    Wells' syndrome     Current Outpatient Medications:    acetaminophen (TYLENOL) 325 MG tablet, Take 2 tablets (650 mg total) by mouth every 4 (four) hours., Disp: , Rfl:    buPROPion (WELLBUTRIN XL) 150 MG 24 hr tablet, Take 1 tablet (150 mg total) by mouth daily., Disp: 90 tablet, Rfl: 4   busPIRone (BUSPAR) 5 MG tablet, Take 1 tablet (5 mg total) by mouth 3 (three) times daily., Disp: 90 tablet, Rfl: 3   coconut oil OIL, Apply 1 Application topically as needed., Disp: , Rfl: 0   escitalopram (LEXAPRO) 20 MG tablet, Take 1 tablet (20 mg total) by mouth daily. (NEEDS TO BE SEEN BEFORE NEXT REFILL), Disp: 90 tablet, Rfl: 4   ferrous sulfate 325 (65 FE) MG tablet, Take 325 mg by mouth daily with breakfast., Disp: , Rfl:    furosemide  (LASIX) 20 MG tablet, Take 1 tablet (20 mg total) by mouth daily for 5 days., Disp: 5 tablet, Rfl: 0   ibuprofen (ADVIL) 600 MG tablet, Take 1 tablet (600 mg total) by mouth every 6 (six) hours., Disp: 30 tablet, Rfl: 1   labetalol (NORMODYNE) 200 MG tablet, Take 1 tablet (200 mg total) by mouth 2 (two) times daily., Disp: 60 tablet, Rfl: 3   levothyroxine (SYNTHROID) 88 MCG tablet, Take 1 tablet (88 mcg total) by mouth daily before breakfast., Disp: 90 tablet, Rfl: 4   magic mouthwash SOLN, Take 5 mLs by mouth 3 (three) times daily as needed for mouth pain., Disp: 30 mL, Rfl: 0   Prenatal Vit-Fe Fumarate-FA (PRENATAL MULTIVITAMIN) TABS tablet, Take 1 tablet by mouth daily at 12 noon., Disp: , Rfl:  Social History   Socioeconomic History   Marital status: Married    Spouse name: Jeri Modena   Number of children: 0   Years of education: 12   Highest education level: High school graduate  Occupational History   Not on file  Tobacco Use   Smoking status: Never    Passive exposure: Never   Smokeless tobacco: Never  Vaping Use   Vaping Use: Never used  Substance and Sexual Activity   Alcohol use:  Not Currently   Drug use: Never   Sexual activity: Yes    Birth control/protection: None  Other Topics Concern   Not on file  Social History Narrative   Not on file   Social Determinants of Health   Financial Resource Strain: Low Risk  (03/26/2022)   Overall Financial Resource Strain (CARDIA)    Difficulty of Paying Living Expenses: Not very hard  Food Insecurity: No Food Insecurity (03/26/2022)   Hunger Vital Sign    Worried About Running Out of Food in the Last Year: Never true    Ran Out of Food in the Last Year: Never true  Transportation Needs: No Transportation Needs (03/26/2022)   PRAPARE - Hydrologist (Medical): No    Lack of Transportation (Non-Medical): No  Physical Activity: Insufficiently Active (03/26/2022)   Exercise Vital Sign    Days of Exercise per  Week: 2 days    Minutes of Exercise per Session: 20 min  Stress: No Stress Concern Present (03/26/2022)   [place]    Feeling of Stress : Not at all  Social Connections: [place] (10/21/2021)   Social Connection and Isolation Panel [NHANES]    Frequency of Communication with Friends and Family: More than three times a week    Frequency of Social Gatherings with Friends and Family: Three times a week    Attends Religious Services: More than 4 times per year    Active Member of Clubs or Organizations: Yes    Attends Archivist Meetings: More than 4 times per year    Marital Status: Married  Human resources officer Violence: Not At Risk (03/26/2022)   Humiliation, Afraid, Rape, and Kick questionnaire    Fear of Current or Ex-Partner: No    Emotionally Abused: No    Physically Abused: No    Sexually Abused: No   Family History  Problem Relation Age of Onset   Kidney disease Mother    Hypertension Mother    Hyperlipidemia Mother    Depression Mother    Cancer Mother        cervical   Arthritis Mother    Anxiety disorder Mother    Depression Father    Anxiety disorder Father    Asthma Brother    COPD Maternal Grandmother    Arthritis Maternal Grandmother     Objective: Office vital signs reviewed. BP 103/73   Pulse 83   Temp 97.7 F (36.5 C)   Ht 5\' 5"  (1.651 m)   SpO2 93%   BMI 39.94 kg/m   Physical Examination:  General: Awake, alert, nontoxic-appearing female, No acute distress HEENT: She has a white creamy substance along the tongue.  No vesicles appreciated in the oropharynx.  No ulcerations.  Assessment/ Plan: 26 y.o. female   Oral thrush - Plan: nystatin (MYCOSTATIN) 100000 UNIT/ML suspension  Clinically consistent with thrush.  Start nystatin mouth rinses 4 times daily until symptoms have resolved.  Discussed sanitization of any drinking cups and changing out her toothbrush in 48  hours.  We will reassess her in 2 weeks, sooner if concerns arise  No orders of the defined types were placed in this encounter.  No orders of the defined types were placed in this encounter.    Janora Norlander, DO [place] [phone]

## 2022-04-07 NOTE — Patient Instructions (Signed)
Nystatin rinse and spit sent. Use until thrush resolves Change out toothbrush, and sterilize all cups  Oral Thrush, Adult Oral thrush is an infection in your mouth and throat and on your tongue. It causes white patches to form in your mouth and on your tongue. Many cases of thrush are mild. But, sometimes, thrush can be serious. People who have a weak body defense system (immune system) or other diseases can be affected more. What are the causes? This condition is caused by a type of fungus called yeast. The fungus is normally present in small amounts in the mouth and nose. If a person has a long-term illness or a weak body defense system, the fungus can grow and spread quickly. This causes thrush. What increases the risk? You are more likely to develop this condition if: You have a weak body defense system. You are an older adult. You have diabetes, cancer, or HIV. You have a dry mouth. You are pregnant or breastfeeding. You do not take good care of your teeth. This risk is greater for people who have false teeth (dentures). You use antibiotic or steroid medicines. What are the signs or symptoms? Symptoms of this condition include: A burning feeling in the mouth and throat. White patches that stick to the mouth and tongue. A bad taste in the mouth or trouble tasting foods. A feeling like you have cotton in your mouth. Pain when you eat and swallow. Not wanting to eat as much as usual. Cracking at the corners of the mouth. How is this treated? This condition is treated with medicines called antifungals. These medicines prevent a fungus from growing. The medicines are either put right on the area (topical) or swallowed (oral). Your doctor will also treat other problems that you may have, such as diabetes or HIV. Follow these instructions at home: Helping with pain and soreness To lessen your pain: Drink cold liquids, like water and iced tea. Eat frozen ice pops or frozen juices. Eat  foods that are easy to swallow, like gelatin and ice cream. Drink from a straw if you have too much pain in your mouth.  General instructions Take or use over-the-counter and prescription medicines only as told by your doctor. Eat plain yogurt that has live cultures in it. Read the label to make sure that there are live cultures in your yogurt. If you wear false teeth: Take them out before you go to bed. Brush them well. Soak them in a cleaner. Rinse your mouth with warm salt-water many times a day. To make the salt-water mixture, dissolve -1 teaspoon (3-6 g) of salt in 1 cup (237 mL) of warm water. Contact a doctor if: Your problems do not get better within 7 days of treatment. Your infection is spreading. This may show as white areas on the skin outside of your mouth. You are nursing your baby and you have redness and pain in the nipples. Summary Oral thrush is an infection in your mouth and throat. It is caused by a fungus. You are more likely to get this condition if you have a weak body defense system. Diseases like diabetes, cancer, or HIV also add to your risk. This condition is treated with medicines called antifungals. Contact a doctor if you do not get better within 7 days of starting treatment. This information is not intended to replace advice given to you by your health care provider. Make sure you discuss any questions you have with your health care provider. Document Revised: 03/05/2021  Document Reviewed: 05/12/2019 Elsevier Patient Education  2023 [employer].

## 2022-04-08 ENCOUNTER — Telehealth: Payer: BC Managed Care – PPO

## 2022-04-09 ENCOUNTER — Encounter: Payer: BC Managed Care – PPO | Admitting: Obstetrics & Gynecology

## 2022-04-09 ENCOUNTER — Other Ambulatory Visit: Payer: BC Managed Care – PPO

## 2022-04-10 ENCOUNTER — Encounter: Payer: Self-pay | Admitting: Obstetrics & Gynecology

## 2022-04-10 ENCOUNTER — Telehealth: Payer: BC Managed Care – PPO

## 2022-04-10 ENCOUNTER — Other Ambulatory Visit: Payer: Self-pay | Admitting: Family Medicine

## 2022-04-13 ENCOUNTER — Telehealth: Payer: BC Managed Care – PPO | Admitting: *Deleted

## 2022-04-13 ENCOUNTER — Ambulatory Visit (INDEPENDENT_AMBULATORY_CARE_PROVIDER_SITE_OTHER): Payer: BC Managed Care – PPO | Admitting: Women's Health

## 2022-04-13 ENCOUNTER — Encounter: Payer: Self-pay | Admitting: Women's Health

## 2022-04-13 VITALS — BP 93/57 | HR 74 | Ht 65.0 in | Wt 218.1 lb

## 2022-04-13 DIAGNOSIS — E063 Autoimmune thyroiditis: Secondary | ICD-10-CM | POA: Diagnosis not present

## 2022-04-13 DIAGNOSIS — M546 Pain in thoracic spine: Secondary | ICD-10-CM

## 2022-04-13 DIAGNOSIS — R5383 Other fatigue: Secondary | ICD-10-CM

## 2022-04-13 DIAGNOSIS — F418 Other specified anxiety disorders: Secondary | ICD-10-CM

## 2022-04-13 DIAGNOSIS — I10 Essential (primary) hypertension: Secondary | ICD-10-CM

## 2022-04-13 DIAGNOSIS — Z8759 Personal history of other complications of pregnancy, childbirth and the puerperium: Secondary | ICD-10-CM | POA: Diagnosis not present

## 2022-04-13 DIAGNOSIS — Z0130 Encounter for examination of blood pressure without abnormal findings: Secondary | ICD-10-CM

## 2022-04-13 DIAGNOSIS — O9081 Anemia of the puerperium: Secondary | ICD-10-CM | POA: Diagnosis not present

## 2022-04-13 MED ORDER — CYCLOBENZAPRINE HCL 10 MG PO TABS: 10.0000 mg | ORAL_TABLET | Freq: Three times a day (TID) | ORAL | 0 refills | Status: DC | PRN

## 2022-04-13 MED ORDER — BUPROPION HCL 75 MG PO TABS: 75.0000 mg | ORAL_TABLET | Freq: Every day | ORAL | 3 refills | Status: DC

## 2022-04-13 NOTE — Patient Instructions (Addendum)
Stop the labetalol Stop buspar if you would like

## 2022-04-13 NOTE — Progress Notes (Signed)
GYN VISIT Patient name: Marcia Gonzalez MRN [id number]  Date of birth: 1995-09-18 Chief Complaint:   Blood Pressure Check and vaginal laceration with tear  History of Present Illness:   Marcia Gonzalez is a 26 y.o. [id number] Caucasian female 9d s/p SVB being seen today to check tear-feels it is not right. Pain at urethra, doesn't feel like UTI. Changes pad q few hours, 3-4 times has had large clot. Numbness Rt leg, pain in bilateral calves. Pain at epidural site and Lt upper back. Is on wellbutrin and buspar, wants to come off buspar, has already decreased to once daily. Denies any issues w/ PPD/anxiety. Wants to try to wean off wellbutrin as well. CHTN, on labetalol 200mg  BID, was not on anything prior to pregnancy.   Tired, sleeps 12hrs, feels weak. Hgb 11.2 on admit, 9.3 after delivery. Delivery note reports pph d/t laceration w/ TXA and pit given, however EBL documented is only 182. Wants to recheck TSH.  No LMP recorded.     01/28/2022   10:12 AM 10/21/2021    2:55 PM 09/19/2021    2:39 PM 08/13/2021    3:46 PM 06/30/2021    3:19 PM  Depression screen PHQ 2/9  Decreased Interest 1 1 1 1 1   Down, Depressed, Hopeless 1 1 1 1 2   PHQ - 2 Score 2 2 2 2 3   Altered sleeping 3 3 0 1 2  Tired, decreased energy 3 3 0 1 2  Change in appetite 3 3 0 1 1  Feeling bad or failure about yourself  2 0 0 1 1  Trouble concentrating 1 0 0 1 0  Moving slowly or fidgety/restless 0 0 0 1 1  Suicidal thoughts 0 0 0 0 0  PHQ-9 Score 14 11 2 8 10   Difficult doing work/chores   Not difficult at all Somewhat difficult Very difficult        01/28/2022   10:12 AM 10/21/2021    3:26 PM 09/19/2021    2:40 PM 08/13/2021    3:47 PM  GAD 7 : Generalized Anxiety Score  Nervous, Anxious, on Edge 2 0 2 1  Control/stop worrying 1 1 3  0  Worry too much - different things 3 1 3  0  Trouble relaxing 2 0 3 1  Restless 0 0 2 0  Easily annoyed or irritable 3 3 3 1   Afraid - awful might happen 1 3 3 1   Total GAD 7 Score 12 8 19  4   Anxiety Difficulty   Extremely difficult Somewhat difficult     Review of Systems:   Pertinent items are noted in HPI Denies fever/chills, dizziness, headaches, visual disturbances, fatigue, shortness of breath, chest pain, abdominal pain, vomiting, abnormal vaginal discharge/itching/odor/irritation, problems with periods, bowel movements, urination, or intercourse unless otherwise stated above.  Pertinent History Reviewed:  Reviewed past medical,surgical, social, obstetrical and family history.  Reviewed problem list, medications and allergies. Physical Assessment:   Vitals:   04/13/22 1430  BP: (!) 93/57  Pulse: 74  Weight: 218 lb 2 oz (98.9 kg)  Height: 5\' 5"  (1.651 m)  Body mass index is 36.3 kg/m.       Physical Examination:   General appearance: alert, well appearing, and in no distress  Mental status: alert, oriented to person, place, and time  Skin: warm & dry   Cardiovascular: normal heart rate noted  Respiratory: normal respiratory effort, no distress  Abdomen: soft, non-tender   Pelvic: lac healing well, urethra appears normal, no  edema/trauma  Extremities: -Homan's bilaterally, no erythema/cords/edema  Back: epidural site w/ small bruise, 3 puncture sites. +tenderness Lt upper back  Chaperone: Latisha Cresenzo    No results found for this or any previous visit (from the past 24 hour(s)).  Assessment & Plan:  1) 9d s/p SVB> lac healing well  2) CHTN> bp low, no meds prior to pregnancy, stop labetalol. Check bp's at home  3) Fatigue/weakness w/ documented PPH> check cbc  4) Dep/anx> improving, ok to stop buspar (has already weaned to once/day), will decrease wellbutrin to 75mg  daily  5) Lt upper back pain> refilled flexeril, use heat/ice  6) Rt lateral leg numbness> not affecting mobility, give it a few more weeks, let know if worsening/changing before pp visit  7) Hashimoto's thyroid disease> repeat TSH per request  Meds:  Meds ordered this  encounter  Medications   buPROPion (WELLBUTRIN) 75 MG tablet    Sig: Take 1 tablet (75 mg total) by mouth daily.    Dispense:  30 tablet    Refill:  3    Order Specific Question:   Supervising Provider    Answer:   [place] H [[id number]]   cyclobenzaprine (FLEXERIL) 10 MG tablet    Sig: Take 1 tablet (10 mg total) by mouth every 8 (eight) hours as needed for muscle spasms.    Dispense:  30 tablet    Refill:  0    Order Specific Question:   Supervising Provider    Answer:   Duane Lope H [[id number]]    Orders Placed This Encounter  Procedures   TSH   CBC    Return for As scheduled.  Duane Lope CNM, [facility] 04/13/2022 3:14 PM

## 2022-04-13 NOTE — Progress Notes (Signed)
Erroneous encounter.   Patient coming into office for exam.  States vaginal tear is not healing right.

## 2022-04-14 ENCOUNTER — Other Ambulatory Visit: Payer: Self-pay | Admitting: Women's Health

## 2022-04-14 DIAGNOSIS — E039 Hypothyroidism, unspecified: Secondary | ICD-10-CM

## 2022-04-14 LAB — CBC
Hematocrit: 36.5 % (ref 34.0–46.6)
Hemoglobin: 12.3 g/dL (ref 11.1–15.9)
MCH: 30.1 pg (ref 26.6–33.0)
MCHC: 33.7 g/dL (ref 31.5–35.7)
MCV: 89 fL (ref 79–97)
Platelets: 477 10*3/uL — ABNORMAL HIGH (ref 150–450)
RBC: 4.09 x10E6/uL (ref 3.77–5.28)
RDW: 12.4 % (ref 11.7–15.4)
WBC: 8.1 10*3/uL (ref 3.4–10.8)

## 2022-04-14 LAB — TSH: TSH: 0.881 u[IU]/mL (ref 0.450–4.500)

## 2022-04-22 ENCOUNTER — Ambulatory Visit (INDEPENDENT_AMBULATORY_CARE_PROVIDER_SITE_OTHER): Payer: BC Managed Care – PPO | Admitting: Family Medicine

## 2022-04-22 ENCOUNTER — Encounter: Payer: Self-pay | Admitting: Family Medicine

## 2022-04-22 VITALS — BP 123/81 | HR 87 | Temp 97.6°F | Ht 65.0 in | Wt 219.8 lb

## 2022-04-22 DIAGNOSIS — F411 Generalized anxiety disorder: Secondary | ICD-10-CM

## 2022-04-22 DIAGNOSIS — E063 Autoimmune thyroiditis: Secondary | ICD-10-CM | POA: Diagnosis not present

## 2022-04-22 DIAGNOSIS — F331 Major depressive disorder, recurrent, moderate: Secondary | ICD-10-CM

## 2022-04-22 DIAGNOSIS — F900 Attention-deficit hyperactivity disorder, predominantly inattentive type: Secondary | ICD-10-CM

## 2022-04-22 MED ORDER — SYNTHROID 88 MCG PO TABS: 88.0000 ug | ORAL_TABLET | Freq: Every day | ORAL | 3 refills | Status: DC

## 2022-04-22 MED ORDER — LISDEXAMFETAMINE DIMESYLATE 30 MG PO CAPS: 30.0000 mg | ORAL_CAPSULE | Freq: Every day | ORAL | 0 refills | Status: DC

## 2022-04-22 NOTE — Progress Notes (Signed)
Subjective: CC: Establish care with new provider PCP: Raliegh Ip, DO NTI:[id number] Berke is a 26 y.o. female presenting to clinic today for:  1.  Hashimoto's thyroiditis This was diagnosed in her teens.  She still has her thyroid.  She is treated with brand-name Synthroid 88 mcg daily as she did not absorb generic well.  Her last TSH was obtained by her OB/GYN and this was normal range so Synthroid has been continued.  She notes it is about 60 box/month and was not aware that there was a mail order pharmacy for Synthroid.  She would certainly like to pursue medication from that pharmacy.  2.  Anxiety depression Patient reports that anxiety depression are stable now that she has been weaning from Wellbutrin.  She is off of BuSpar.  She continues Lexapro 20 mg daily and has been on this for quite some time now.  She notes that she was previously diagnosed with ADHD in her youth and was treated with Concerta.  She had discontinued that because she felt that it was causing some sedation but she notes that since the delivery of her child that she has really been all over the place and found it difficult to focus.  Sometimes she forgets to eat because she is so distracted.  Her husband does identify some anxiety symptoms, particularly relating to being a new mom and feeling an adequate.  Patient admits that she does not sleep well because the baby's needs are demanding and on average she feels like she gets less than 2 hours per night.  Her mother is back at work so not available to help her.  Her husband works at high stress, high risk job so much of the nighttime feeds fall on her shoulders.  She worries about being a burden to her friends and family and ask for help but she has had several people offer her help.  ROS: Per HPI  Allergies  Allergen Reactions   Codeine Nausea Only   Augmentin [Amoxicillin-Pot Clavulanate] Other (See Comments)    Hallucinations   Misoprostol     Left blisters  in mouth   Oxycodone Hives and Itching    Tylenol 3   Other Rash    Latex tape, causes reaction and blisters.   Past Medical History:  Diagnosis Date   Allergy    Anemia    Anxiety    Arthritis    Asthma    Depression    Endometriosis    GERD (gastroesophageal reflux disease)    Hypertension    gestational   Hypothyroidism    IIH (idiopathic intracranial hypertension)    Pregnancy induced hypertension    UTI (urinary tract infection)    Wells' syndrome     Current Outpatient Medications:    acetaminophen (TYLENOL) 325 MG tablet, Take 2 tablets (650 mg total) by mouth every 4 (four) hours., Disp: , Rfl:    buPROPion (WELLBUTRIN) 75 MG tablet, Take 1 tablet (75 mg total) by mouth daily., Disp: 30 tablet, Rfl: 3   busPIRone (BUSPAR) 5 MG tablet, Take 1 tablet (5 mg total) by mouth 3 (three) times daily., Disp: 90 tablet, Rfl: 3   cyclobenzaprine (FLEXERIL) 10 MG tablet, Take 1 tablet (10 mg total) by mouth every 8 (eight) hours as needed for muscle spasms., Disp: 30 tablet, Rfl: 0   escitalopram (LEXAPRO) 20 MG tablet, Take 1 tablet (20 mg total) by mouth daily. (NEEDS TO BE SEEN BEFORE NEXT REFILL), Disp: 90 tablet, Rfl: 4  ferrous sulfate 325 (65 FE) MG tablet, Take 325 mg by mouth daily with breakfast., Disp: , Rfl:    ibuprofen (ADVIL) 600 MG tablet, Take 1 tablet (600 mg total) by mouth every 6 (six) hours., Disp: 30 tablet, Rfl: 1   levothyroxine (SYNTHROID) 88 MCG tablet, Take 1 tablet (88 mcg total) by mouth daily before breakfast., Disp: 90 tablet, Rfl: 4   Prenatal Vit-Fe Fumarate-FA (PRENATAL MULTIVITAMIN) TABS tablet, Take 1 tablet by mouth daily at 12 noon., Disp: , Rfl:  Social History   Socioeconomic History   Marital status: Married    Spouse name: Jeri Modena   Number of children: 0   Years of education: 12   Highest education level: High school graduate  Occupational History   Not on file  Tobacco Use   Smoking status: Never    Passive exposure: Never    Smokeless tobacco: Never  Vaping Use   Vaping Use: Never used  Substance and Sexual Activity   Alcohol use: Not Currently   Drug use: Never   Sexual activity: Yes    Birth control/protection: None  Other Topics Concern   Not on file  Social History Narrative   Not on file   Social Determinants of Health   Financial Resource Strain: Low Risk  (04/13/2022)   Overall Financial Resource Strain (CARDIA)    Difficulty of Paying Living Expenses: Not very hard  Food Insecurity: No Food Insecurity (04/13/2022)   Hunger Vital Sign    Worried About Running Out of Food in the Last Year: Never true    Ran Out of Food in the Last Year: Never true  Transportation Needs: No Transportation Needs (04/13/2022)   PRAPARE - Administrator, Civil Service (Medical): No    Lack of Transportation (Non-Medical): No  Physical Activity: Insufficiently Active (04/13/2022)   Exercise Vital Sign    Days of Exercise per Week: 1 day    Minutes of Exercise per Session: 10 min  Stress: No Stress Concern Present (04/13/2022)   [employer] of Occupational Health - Occupational Stress Questionnaire    Feeling of Stress : Only a little  Social Connections: Socially Integrated (04/13/2022)   Social Connection and Isolation Panel [NHANES]    Frequency of Communication with Friends and Family: More than three times a week    Frequency of Social Gatherings with Friends and Family: Once a week    Attends Religious Services: More than 4 times per year    Active Member of [employer] or Organizations: Yes    Attends Engineer, structural: More than 4 times per year    Marital Status: Married  Catering manager Violence: Not At Risk (04/13/2022)   Humiliation, Afraid, Rape, and Kick questionnaire    Fear of Current or Ex-Partner: No    Emotionally Abused: No    Physically Abused: No    Sexually Abused: No   Family History  Problem Relation Age of Onset   Kidney disease Mother    Hypertension Mother     Hyperlipidemia Mother    Depression Mother    Cancer Mother        cervical   Arthritis Mother    Anxiety disorder Mother    Depression Father    Anxiety disorder Father    Asthma Brother    COPD Maternal Grandmother    Arthritis Maternal Grandmother     Objective: Office vital signs reviewed. BP 123/81   Pulse 87   Temp 97.6 F (36.4 C)  Ht 5\' 5"  (1.651 m)   Wt 219 lb 12.8 oz (99.7 kg)   SpO2 96%   Breastfeeding No   BMI 36.58 kg/m   Physical Examination:  General: Awake, alert, well nourished, No acute distress HEENT: Sclera white.  Moist mucous membranes. Cardio: regular rate and rhythm, S1S2 heard, no murmurs appreciated Pulm: clear to auscultation bilaterally, no wheezes, rhonchi or rales; normal work of breathing on room air Neuro: No tremor Psych: Mood stable, speech normal, affect appropriate    04/22/2022    3:57 PM 01/28/2022   10:12 AM 10/21/2021    2:55 PM  Depression screen PHQ 2/9  Decreased Interest 0 1 1  Down, Depressed, Hopeless 0 1 1  PHQ - 2 Score 0 2 2  Altered sleeping 0 3 3  Tired, decreased energy 3 3 3   Change in appetite 3 3 3   Feeling bad or failure about yourself  0 2 0  Trouble concentrating 0 1 0  Moving slowly or fidgety/restless 1 0 0  Suicidal thoughts 0 0 0  PHQ-9 Score 7 14 11   Difficult doing work/chores Somewhat difficult        04/22/2022    3:57 PM 01/28/2022   10:12 AM 10/21/2021    3:26 PM 09/19/2021    2:40 PM  GAD 7 : Generalized Anxiety Score  Nervous, Anxious, on Edge 0 2 0 2  Control/stop worrying 0 1 1 3   Worry too much - different things 0 3 1 3   Trouble relaxing 0 2 0 3  Restless 0 0 0 2  Easily annoyed or irritable 1 3 3 3   Afraid - awful might happen 3 1 3 3   Total GAD 7 Score 4 12 8 19   Anxiety Difficulty Somewhat difficult   Extremely difficult     Assessment/ Plan: 26 y.o. female   Hashimoto's thyroiditis - Plan: SYNTHROID 88 MCG tablet  Depression, major, recurrent, moderate  (HCC)  Generalized anxiety disorder  Attention deficit hyperactivity disorder (ADHD), predominantly inattentive type - Plan: lisdexamfetamine (VYVANSE) 30 MG capsule, DISCONTINUED: lisdexamfetamine (VYVANSE) 30 MG capsule  TSH was normal recently with her OB/GYN.  I have sent in brand-name Synthroid to the Synthroid mail order pharmacy and given her a flyer so that she can reach out to them to establish an account.  This should be substantially cheaper for her  Her depression and anxiety are stable.  I discussed with her self-care and need to allow people to help her so that she can get adequate rest and nutrition.  She is actively weaning from Wellbutrin and off of BuSpar.  She will continue Lexapro 20 mg daily and we are adding Vyvanse for her ADHD today.  We will plan to follow-up with her again in about 2 to 3 weeks.  Appointment scheduled.  She knows to reach out to me prior to that next visit if she is have any concerning features.  She is no longer breast-feeding  The Narcotic Database has been reviewed.  There were no red flags.  Continue current response to medication, will plan for UDS and CSC at next visit.   No orders of the defined types were placed in this encounter.  No orders of the defined types were placed in this encounter.  Total time spent with patient 32 minutes.  Greater than 50% of encounter spent in coordination of care/counseling.   Janora Norlander, DO [place] [phone]

## 2022-04-27 ENCOUNTER — Encounter: Payer: Self-pay | Admitting: Family Medicine

## 2022-04-27 ENCOUNTER — Other Ambulatory Visit: Payer: Self-pay | Admitting: Family Medicine

## 2022-04-27 DIAGNOSIS — F331 Major depressive disorder, recurrent, moderate: Secondary | ICD-10-CM

## 2022-04-27 DIAGNOSIS — F411 Generalized anxiety disorder: Secondary | ICD-10-CM

## 2022-04-27 MED ORDER — ESCITALOPRAM OXALATE 20 MG PO TABS: 20.0000 mg | ORAL_TABLET | Freq: Every day | ORAL | 3 refills | Status: DC

## 2022-04-29 ENCOUNTER — Other Ambulatory Visit: Payer: Self-pay | Admitting: *Deleted

## 2022-05-03 ENCOUNTER — Encounter: Payer: Self-pay | Admitting: Family Medicine

## 2022-05-04 ENCOUNTER — Encounter: Payer: Self-pay | Admitting: Family Medicine

## 2022-05-04 ENCOUNTER — Ambulatory Visit (INDEPENDENT_AMBULATORY_CARE_PROVIDER_SITE_OTHER): Payer: BC Managed Care – PPO | Admitting: Family Medicine

## 2022-05-04 VITALS — BP 117/79 | HR 95 | Temp 98.0°F | Ht 65.0 in | Wt 223.8 lb

## 2022-05-04 DIAGNOSIS — R1112 Projectile vomiting: Secondary | ICD-10-CM | POA: Diagnosis not present

## 2022-05-04 DIAGNOSIS — R1013 Epigastric pain: Secondary | ICD-10-CM

## 2022-05-04 MED ORDER — PANTOPRAZOLE SODIUM 40 MG PO TBEC: 40.0000 mg | DELAYED_RELEASE_TABLET | Freq: Every day | ORAL | 0 refills | Status: DC | PRN

## 2022-05-04 NOTE — Progress Notes (Signed)
Subjective: HF:SFSELTRVU pain PCP: Janora Norlander, DO YEB:[id number] Marcia Gonzalez is a 26 y.o. female presenting to clinic today for:  1. Abdominal pain Patient reports that she had sudden onset of abdominal pain/rib lower rib pain and back pain that started abruptly.  This resulted in nausea and vomiting and she felt like someone was sitting on her chest and she could not catch her breath.  During this time her blood pressure spiked up.  She felt better after vomiting but notes that she vomited several times before the pain finally went away.  She does admit that this was a right upper quadrant pain but it radiated along the diaphragm and into the back.   ROS: Per HPI  Allergies  Allergen Reactions   Codeine Nausea Only   Augmentin [Amoxicillin-Pot Clavulanate] Other (See Comments)    Hallucinations   Misoprostol     Left blisters in mouth   Oxycodone Hives and Itching    Tylenol 3   Other Rash    Latex tape, causes reaction and blisters.   Past Medical History:  Diagnosis Date   Allergy    Anemia    Anxiety    Arthritis    Asthma    Depression    Endometriosis    GERD (gastroesophageal reflux disease)    Hypertension    gestational   Hypothyroidism    IIH (idiopathic intracranial hypertension)    Pregnancy induced hypertension    UTI (urinary tract infection)    Wells' syndrome     Current Outpatient Medications:    acetaminophen (TYLENOL) 325 MG tablet, Take 2 tablets (650 mg total) by mouth every 4 (four) hours., Disp: , Rfl:    buPROPion (WELLBUTRIN) 75 MG tablet, Take 1 tablet (75 mg total) by mouth daily., Disp: 30 tablet, Rfl: 3   busPIRone (BUSPAR) 5 MG tablet, Take 1 tablet (5 mg total) by mouth 3 (three) times daily., Disp: 90 tablet, Rfl: 3   cyclobenzaprine (FLEXERIL) 10 MG tablet, Take 1 tablet (10 mg total) by mouth every 8 (eight) hours as needed for muscle spasms., Disp: 30 tablet, Rfl: 0   escitalopram (LEXAPRO) 20 MG tablet, Take 1 tablet (20 mg  total) by mouth daily., Disp: 90 tablet, Rfl: 3   ferrous sulfate 325 (65 FE) MG tablet, Take 325 mg by mouth daily with breakfast., Disp: , Rfl:    ibuprofen (ADVIL) 600 MG tablet, Take 1 tablet (600 mg total) by mouth every 6 (six) hours., Disp: 30 tablet, Rfl: 1   lisdexamfetamine (VYVANSE) 30 MG capsule, Take 1 capsule (30 mg total) by mouth daily., Disp: 30 capsule, Rfl: 0   Prenatal Vit-Fe Fumarate-FA (PRENATAL MULTIVITAMIN) TABS tablet, Take 1 tablet by mouth daily at 12 noon., Disp: , Rfl:    SYNTHROID 88 MCG tablet, Take 1 tablet (88 mcg total) by mouth daily before breakfast., Disp: 90 tablet, Rfl: 3 Social History   Socioeconomic History   Marital status: Married    Spouse name: Darlyn Chamber   Number of children: 0   Years of education: 12   Highest education level: High school graduate  Occupational History   Not on file  Tobacco Use   Smoking status: Never    Passive exposure: Never   Smokeless tobacco: Never  Vaping Use   Vaping Use: Never used  Substance and Sexual Activity   Alcohol use: Not Currently   Drug use: Never   Sexual activity: Yes    Birth control/protection: None  Other Topics Concern  Not on file  Social History Narrative   Not on file   Social Determinants of Health   Financial Resource Strain: Low Risk  (04/13/2022)   Overall Financial Resource Strain (CARDIA)    Difficulty of Paying Living Expenses: Not very hard  Food Insecurity: No Food Insecurity (04/13/2022)   Hunger Vital Sign    Worried About Running Out of Food in the Last Year: Never true    Ran Out of Food in the Last Year: Never true  Transportation Needs: No Transportation Needs (04/13/2022)   PRAPARE - Hydrologist (Medical): No    Lack of Transportation (Non-Medical): No  Physical Activity: Insufficiently Active (04/13/2022)   Exercise Vital Sign    Days of Exercise per Week: 1 day    Minutes of Exercise per Session: 10 min  Stress: No Stress Concern  Present (04/13/2022)   [place]    Feeling of Stress : Only a little  Social Connections: Socially Integrated (04/13/2022)   Social Connection and Isolation Panel [NHANES]    Frequency of Communication with Friends and Family: More than three times a week    Frequency of Social Gatherings with Friends and Family: Once a week    Attends Religious Services: More than 4 times per year    Active Member of [employer] or Organizations: Yes    Attends Music therapist: More than 4 times per year    Marital Status: Married  Human resources officer Violence: Not At Risk (04/13/2022)   Humiliation, Afraid, Rape, and Kick questionnaire    Fear of Current or Ex-Partner: No    Emotionally Abused: No    Physically Abused: No    Sexually Abused: No   Family History  Problem Relation Age of Onset   Kidney disease Mother    Hypertension Mother    Hyperlipidemia Mother    Depression Mother    Cancer Mother        cervical   Arthritis Mother    Anxiety disorder Mother    Depression Father    Anxiety disorder Father    Asthma Brother    COPD Maternal Grandmother    Arthritis Maternal Grandmother     Objective: Office vital signs reviewed. BP 117/79   Pulse 95   Temp 98 F (36.7 C)   Ht 5' 5" (1.651 m)   Wt 223 lb 12.8 oz (101.5 kg)   SpO2 94%   BMI 37.24 kg/m   Physical Examination:  General: Awake, alert, appears somewhat pale., No acute distress HEENT: Sclera are anicteric.  Moist mucous membranes GI: soft, right upper quadrant tenderness present on exam.  No rebound or guarding, non-distended, bowel sounds present x4, no hepatomegaly, no splenomegaly, no masses    Assessment/ Plan: 26 y.o. female   Epigastric pain - Plan: US Abdomen Limited RUQ (LIVER/GB), CMP14+EGFR, Lipase, CBC  Projectile vomiting with nausea - Plan: pantoprazole (PROTONIX) 40 MG tablet, US Abdomen Limited RUQ (LIVER/GB), CMP14+EGFR, Lipase,  CBC  Very suspicious for gallbladder mediated etiology but will empirically placed on a PPI to see if perhaps GERD may be causative.  Stat right upper quadrant ultrasound ordered and will be collected tomorrow.  CMP, lipase and CBC.  Discussed red flag signs and symptoms warranting further evaluation  No orders of the defined types were placed in this encounter.  No orders of the defined types were placed in this encounter.    Janora Norlander, DO  [place] [phone]

## 2022-05-04 NOTE — Patient Instructions (Signed)
Cholecystitis Cholecystitis is irritation and swelling (inflammation) of the gallbladder. The gallbladder: Is an organ that is shaped like a pear. Is under the liver on the right side of the body. Stores bile. Bile helps the body break down (digest) the fats in food. This condition can occur all of a sudden. It needs to be treated. What are the causes? This condition may be caused by stones or lumps that form in the gallbladder (gallstones). Gallstones can block the tube (duct) that carries bile out of your gallbladder. Other causes include: Damage to the gallbladder due to less blood flow. Germs in the bile ducts. Scars, kinks, or adhesions in the bile ducts. Abnormal growths (tumors) in the liver, pancreas, or gallbladder. What increases the risk? You are more likely to develop this condition if: You are female and between the ages of 55-62. You take birth control pills. You use estrogen. You take certain medicines that make you more likely to develop gallstones. You are overweight (obese). You have a very bad reaction to an infection (sepsis). You have been hospitalized due to a serious condition, such as a burn or illness. You have not eaten or drank for a long time. What are the signs or symptoms? Symptoms of this condition include: Pain in the upper right part of the belly (abdomen). A lump over the gallbladder. Bloating in the belly. Feeling sick to your stomach (nauseous). Vomiting. Fever. Chills. How is this treated? This condition may be treated with: Medicines to treat pain. Giving fluids through an IV tube. Not eating or drinking (fasting). Antibiotic medicines. Surgery to take out your gallbladder. Gallbladder drainage. Follow these instructions at home: Medicines  Take over-the-counter and prescription medicines only as told by your doctor. If you were prescribed an antibiotic medicine, take it as told by your doctor. Do not stop taking it even if you start  to feel better. General instructions Follow instructions from your doctor about what to eat or drink. Do not eat or drink anything that makes you sick again. Do not smoke or use any products that contain nicotine or tobacco. If you need help quitting, ask your doctor. Keep all follow-up visits. Contact a doctor if: You have pain and your medicine does not help. You have a fever. Get help right away if: Your pain moves to: Another part of your belly. Your back. Your symptoms do not go away. You have new symptoms. These symptoms may be an emergency. Get help right away. Call 911. Do not wait to see if the symptoms will go away. Do not drive yourself to the hospital. Summary This condition may be caused by stones or lumps that form in the gallbladder (gallstones). A common symptom is pain in your belly. This condition may be treated with surgery to take out your gallbladder. Follow instructions from your doctor about what to eat or drink. This information is not intended to replace advice given to you by your health care provider. Make sure you discuss any questions you have with your health care provider. Document Revised: 01/07/2021 Document Reviewed: 01/07/2021 Elsevier Patient Education  2023 Elsevier Inc.  

## 2022-05-04 NOTE — Telephone Encounter (Signed)
See if she can come in today. Give her a 30 and we'll do EKG, labs, etc.

## 2022-05-05 ENCOUNTER — Other Ambulatory Visit: Payer: Self-pay | Admitting: Family Medicine

## 2022-05-05 ENCOUNTER — Ambulatory Visit (HOSPITAL_COMMUNITY)
Admission: RE | Admit: 2022-05-05 | Discharge: 2022-05-05 | Disposition: A | Discharge status: Home / Self Care | Payer: BC Managed Care – PPO | Source: Ambulatory Visit | Attending: Family Medicine | Admitting: Family Medicine

## 2022-05-05 ENCOUNTER — Encounter: Payer: Self-pay | Admitting: Family Medicine

## 2022-05-05 DIAGNOSIS — K8020 Calculus of gallbladder without cholecystitis without obstruction: Secondary | ICD-10-CM

## 2022-05-05 DIAGNOSIS — R1013 Epigastric pain: Secondary | ICD-10-CM | POA: Diagnosis not present

## 2022-05-05 DIAGNOSIS — R1112 Projectile vomiting: Secondary | ICD-10-CM | POA: Insufficient documentation

## 2022-05-05 DIAGNOSIS — R1011 Right upper quadrant pain: Secondary | ICD-10-CM | POA: Diagnosis not present

## 2022-05-05 DIAGNOSIS — R1110 Vomiting, unspecified: Secondary | ICD-10-CM | POA: Diagnosis not present

## 2022-05-05 DIAGNOSIS — K760 Fatty (change of) liver, not elsewhere classified: Secondary | ICD-10-CM | POA: Diagnosis not present

## 2022-05-05 LAB — CMP14+EGFR
ALT: 28 IU/L (ref 0–32)
AST: 33 IU/L (ref 0–40)
Albumin/Globulin Ratio: 1.6 (ref 1.2–2.2)
Albumin: 4.2 g/dL (ref 4.0–5.0)
Alkaline Phosphatase: 89 IU/L (ref 44–121)
BUN/Creatinine Ratio: 11 (ref 9–23)
BUN: 9 mg/dL (ref 6–20)
Bilirubin Total: 0.3 mg/dL (ref 0.0–1.2)
CO2: 23 mmol/L (ref 20–29)
Calcium: 9.2 mg/dL (ref 8.7–10.2)
Chloride: 105 mmol/L (ref 96–106)
Creatinine, Ser: 0.8 mg/dL (ref 0.57–1.00)
Globulin, Total: 2.6 g/dL (ref 1.5–4.5)
Glucose: 89 mg/dL (ref 70–99)
Potassium: 4.7 mmol/L (ref 3.5–5.2)
Sodium: 142 mmol/L (ref 134–144)
Total Protein: 6.8 g/dL (ref 6.0–8.5)
eGFR: 105 mL/min/{1.73_m2} (ref >59)

## 2022-05-05 LAB — CBC
Hematocrit: 37.6 % (ref 34.0–46.6)
Hemoglobin: 12.5 g/dL (ref 11.1–15.9)
MCH: 29.2 pg (ref 26.6–33.0)
MCHC: 33.2 g/dL (ref 31.5–35.7)
MCV: 88 fL (ref 79–97)
Platelets: 347 10*3/uL (ref 150–450)
RBC: 4.28 x10E6/uL (ref 3.77–5.28)
RDW: 12.2 % (ref 11.7–15.4)
WBC: 6.1 10*3/uL (ref 3.4–10.8)

## 2022-05-05 LAB — LIPASE: Lipase: 23 U/L (ref 14–72)

## 2022-05-06 ENCOUNTER — Other Ambulatory Visit: Payer: Self-pay | Admitting: *Deleted

## 2022-05-07 ENCOUNTER — Encounter: Payer: Self-pay | Admitting: Women's Health

## 2022-05-11 ENCOUNTER — Ambulatory Visit: Payer: BC Managed Care – PPO | Admitting: Women's Health

## 2022-05-14 ENCOUNTER — Ambulatory Visit: Payer: BC Managed Care – PPO | Admitting: General Surgery

## 2022-05-14 ENCOUNTER — Other Ambulatory Visit: Payer: Self-pay | Admitting: Adult Health

## 2022-05-14 ENCOUNTER — Telehealth: Payer: Self-pay | Admitting: *Deleted

## 2022-05-14 MED ORDER — BUPROPION HCL ER (XL) 150 MG PO TB24: 150.0000 mg | ORAL_TABLET | ORAL | 1 refills | Status: DC

## 2022-05-14 NOTE — Telephone Encounter (Signed)
[facility]. JSY

## 2022-05-14 NOTE — Progress Notes (Signed)
Refilled Wellbutrin XL 150 mg

## 2022-05-18 ENCOUNTER — Ambulatory Visit (INDEPENDENT_AMBULATORY_CARE_PROVIDER_SITE_OTHER): Payer: BC Managed Care – PPO | Admitting: Family Medicine

## 2022-05-18 ENCOUNTER — Encounter: Payer: Self-pay | Admitting: Family Medicine

## 2022-05-18 VITALS — BP 128/63 | HR 92 | Temp 98.3°F | Ht 65.0 in | Wt 220.0 lb

## 2022-05-18 DIAGNOSIS — Z23 Encounter for immunization: Secondary | ICD-10-CM

## 2022-05-18 DIAGNOSIS — F900 Attention-deficit hyperactivity disorder, predominantly inattentive type: Secondary | ICD-10-CM | POA: Diagnosis not present

## 2022-05-18 DIAGNOSIS — F331 Major depressive disorder, recurrent, moderate: Secondary | ICD-10-CM | POA: Diagnosis not present

## 2022-05-18 DIAGNOSIS — E063 Autoimmune thyroiditis: Secondary | ICD-10-CM

## 2022-05-18 DIAGNOSIS — F411 Generalized anxiety disorder: Secondary | ICD-10-CM | POA: Diagnosis not present

## 2022-05-18 MED ORDER — LISDEXAMFETAMINE DIMESYLATE 40 MG PO CAPS: 40.0000 mg | ORAL_CAPSULE | ORAL | 0 refills | Status: DC

## 2022-05-18 MED ORDER — BUSPIRONE HCL 15 MG PO TABS: 15.0000 mg | ORAL_TABLET | Freq: Three times a day (TID) | ORAL | 1 refills | Status: DC

## 2022-05-18 NOTE — Progress Notes (Signed)
Subjective: EX:BMWU PCP: Raliegh Ip, DO XLK:[id number] Marcia Gonzalez is a 26 y.o. female presenting to clinic today for:  1.  ADHD associated with depression and anxiety Patient is compliant with Wellbutrin.  At last visit we added Vyvanse 30 mg daily.  She reports that symptoms have been still uncontrolled.  She is not really noticing a difference at all with the Vyvanse 30 mg.  She has increased anxiety and would like to go back on the buspirone, which she used to be treated with prior to pregnancy.  She was reinitiated on her Wellbutrin and continue the Lexapro 20 mg.  She is not yet ready to see a specialist for these issues.   ROS: Per HPI  Allergies  Allergen Reactions   Codeine Nausea Only   Augmentin [Amoxicillin-Pot Clavulanate] Other (See Comments)    Hallucinations   Misoprostol     Left blisters in mouth   Oxycodone Hives and Itching    Tylenol 3   Other Rash    Latex tape, causes reaction and blisters.   Past Medical History:  Diagnosis Date   Allergy    Anemia    Anxiety    Arthritis    Asthma    Depression    Endometriosis    GERD (gastroesophageal reflux disease)    Hypertension    gestational   Hypothyroidism    IIH (idiopathic intracranial hypertension)    Pregnancy induced hypertension    UTI (urinary tract infection)    Wells' syndrome     Current Outpatient Medications:    acetaminophen (TYLENOL) 325 MG tablet, Take 2 tablets (650 mg total) by mouth every 4 (four) hours., Disp: , Rfl:    buPROPion (WELLBUTRIN XL) 150 MG 24 hr tablet, Take 1 tablet (150 mg total) by mouth every morning., Disp: 90 tablet, Rfl: 1   cyclobenzaprine (FLEXERIL) 10 MG tablet, Take 1 tablet (10 mg total) by mouth every 8 (eight) hours as needed for muscle spasms., Disp: 30 tablet, Rfl: 0   escitalopram (LEXAPRO) 20 MG tablet, Take 1 tablet (20 mg total) by mouth daily., Disp: 90 tablet, Rfl: 3   ferrous sulfate 325 (65 FE) MG tablet, Take 325 mg by mouth daily with  breakfast., Disp: , Rfl:    ibuprofen (ADVIL) 600 MG tablet, Take 1 tablet (600 mg total) by mouth every 6 (six) hours., Disp: 30 tablet, Rfl: 1   lisdexamfetamine (VYVANSE) 30 MG capsule, Take 1 capsule (30 mg total) by mouth daily., Disp: 30 capsule, Rfl: 0   pantoprazole (PROTONIX) 40 MG tablet, Take 1 tablet (40 mg total) by mouth daily as needed., Disp: 90 tablet, Rfl: 0   Prenatal Vit-Fe Fumarate-FA (PRENATAL MULTIVITAMIN) TABS tablet, Take 1 tablet by mouth daily at 12 noon., Disp: , Rfl:    SYNTHROID 88 MCG tablet, Take 1 tablet (88 mcg total) by mouth daily before breakfast., Disp: 90 tablet, Rfl: 3 Social History   Socioeconomic History   Marital status: Married    Spouse name: Jeri Modena   Number of children: 0   Years of education: 12   Highest education level: High school graduate  Occupational History   Not on file  Tobacco Use   Smoking status: Never    Passive exposure: Never   Smokeless tobacco: Never  Vaping Use   Vaping Use: Never used  Substance and Sexual Activity   Alcohol use: Not Currently   Drug use: Never   Sexual activity: Yes    Birth control/protection: None  Other  Topics Concern   Not on file  Social History Narrative   Not on file   Social Determinants of Health   Financial Resource Strain: Low Risk  (04/13/2022)   Overall Financial Resource Strain (CARDIA)    Difficulty of Paying Living Expenses: Not very hard  Food Insecurity: No Food Insecurity (04/13/2022)   Hunger Vital Sign    Worried About Running Out of Food in the Last Year: Never true    Ran Out of Food in the Last Year: Never true  Transportation Needs: No Transportation Needs (04/13/2022)   PRAPARE - Hydrologist (Medical): No    Lack of Transportation (Non-Medical): No  Physical Activity: Insufficiently Active (04/13/2022)   Exercise Vital Sign    Days of Exercise per Week: 1 day    Minutes of Exercise per Session: 10 min  Stress: No Stress Concern  Present (04/13/2022)   [place]    Feeling of Stress : Only a little  Social Connections: Socially Integrated (04/13/2022)   Social Connection and Isolation Panel [NHANES]    Frequency of Communication with Friends and Family: More than three times a week    Frequency of Social Gatherings with Friends and Family: Once a week    Attends Religious Services: More than 4 times per year    Active Member of [employer] or Organizations: Yes    Attends Music therapist: More than 4 times per year    Marital Status: Married  Human resources officer Violence: Not At Risk (04/13/2022)   Humiliation, Afraid, Rape, and Kick questionnaire    Fear of Current or Ex-Partner: No    Emotionally Abused: No    Physically Abused: No    Sexually Abused: No   Family History  Problem Relation Age of Onset   Kidney disease Mother    Hypertension Mother    Hyperlipidemia Mother    Depression Mother    Cancer Mother        cervical   Arthritis Mother    Anxiety disorder Mother    Depression Father    Anxiety disorder Father    Asthma Brother    COPD Maternal Grandmother    Arthritis Maternal Grandmother     Objective: Office vital signs reviewed. BP 128/63   Pulse 92   Temp 98.3 F (36.8 C)   Ht 5\' 5"  (1.651 m)   Wt 220 lb (99.8 kg)   SpO2 93%   Breastfeeding No   BMI 36.61 kg/m   Physical Examination:  General: Awake, alert, well nourished, No acute distress HEENT: Sclera white.  No exophthalmos.  No goiter Cardio: regular rate and rhythm  Pulm:   normal work of breathing on room air Skin: dry; intact; no rashes or lesions; normal temperature Neuro: No tremor Psych: Appears somewhat overwhelmed but very pleasant and interactive     05/18/2022    3:40 PM 05/04/2022    3:52 PM 04/22/2022    3:57 PM  Depression screen PHQ 2/9  Decreased Interest 1 0 0  Down, Depressed, Hopeless 3 1 0  PHQ - 2 Score 4 1 0  Altered  sleeping 3 0 0  Tired, decreased energy 3 1 3   Change in appetite 3 3 3   Feeling bad or failure about yourself  2 0 0  Trouble concentrating 3 3 0  Moving slowly or fidgety/restless 2 0 1  Suicidal thoughts 0 0 0  PHQ-9 Score 20 8  7  Difficult doing work/chores Extremely dIfficult Somewhat difficult Somewhat difficult      05/18/2022    3:40 PM 05/04/2022    3:53 PM 04/22/2022    3:57 PM 01/28/2022   10:12 AM  GAD 7 : Generalized Anxiety Score  Nervous, Anxious, on Edge 3 1 0 2  Control/stop worrying 3 1 0 1  Worry too much - different things 3 1 0 3  Trouble relaxing 3 1 0 2  Restless 2 0 0 0  Easily annoyed or irritable 3 1 1 3   Afraid - awful might happen 3 1 3 1   Total GAD 7 Score 20 6 4 12   Anxiety Difficulty Extremely difficult Somewhat difficult Somewhat difficult     Assessment/ Plan: 26 y.o. female   Attention deficit hyperactivity disorder (ADHD), predominantly inattentive type - Plan: lisdexamfetamine (VYVANSE) 40 MG capsule, Drug Screen 10 W/Conf, Serum, CANCELED: ToxASSURE Select 13 (MW), Urine  Depression, major, recurrent, moderate (HCC) - Plan: busPIRone (BUSPAR) 15 MG tablet  Generalized anxiety disorder - Plan: busPIRone (BUSPAR) 15 MG tablet  Need for immunization against influenza - Plan: Flu Vaccine QUAD 22mo+IM (Fluarix, Fluzone & Alfiuria Quad PF)  Hashimoto's thyroiditis - Plan: TSH, T4, Free  ADHD is not at goal.  Advance Vyvanse to 40 mg.  Drug screen and controlled substance contract updated per office policy today  Restart BuSpar 15 mg 3 times daily.  This is increased from her previous dosing schedule.  I did offer her referral to Dr. for [facility] but she wanted to hold off and see if the increased dose of Vyvanse in addition back of BuSpar would be helpful.  She will continue Lexapro and Wellbutrin as prescribed.  If anxiety becomes worse on the Vyvanse, low threshold to discontinue this medication as it may be causation.  Check TSH, free T4  given increase in above symptoms.  Influenza vaccination administered.  We will reconvene again in 1 month  No orders of the defined types were placed in this encounter.  No orders of the defined types were placed in this encounter.    5mo, DO [facility] [phone]

## 2022-05-22 LAB — T4, FREE: Free T4: 1.61 ng/dL (ref 0.82–1.77)

## 2022-05-22 LAB — DRUG SCREEN 10 W/CONF, SERUM
Amphetamines, IA: NEGATIVE ng/mL
Barbiturates, IA: NEGATIVE ug/mL
Benzodiazepines, IA: NEGATIVE ng/mL
Cocaine & Metabolite, IA: NEGATIVE ng/mL
Methadone, IA: NEGATIVE ng/mL
Opiates, IA: NEGATIVE ng/mL
Oxycodones, IA: NEGATIVE ng/mL
Phencyclidine, IA: NEGATIVE ng/mL
Propoxyphene, IA: NEGATIVE ng/mL
THC(Marijuana) Metabolite, IA: NEGATIVE ng/mL

## 2022-05-22 LAB — TSH: TSH: 0.464 u[IU]/mL (ref 0.450–4.500)

## 2022-05-28 ENCOUNTER — Telehealth: Payer: BC Managed Care – PPO | Admitting: Physician Assistant

## 2022-05-28 DIAGNOSIS — B3731 Acute candidiasis of vulva and vagina: Secondary | ICD-10-CM

## 2022-05-28 MED ORDER — FLUCONAZOLE 150 MG PO TABS: 150.0000 mg | ORAL_TABLET | Freq: Once | ORAL | 0 refills | Status: AC

## 2022-05-28 NOTE — Progress Notes (Signed)

## 2022-05-28 NOTE — Progress Notes (Signed)
I have spent 5 minutes in review of e-visit questionnaire, review and updating patient chart, medical decision making and response to patient.   Krystle Oberman Cody Kayelynn Abdou, PA-C    

## 2022-06-16 ENCOUNTER — Telehealth (INDEPENDENT_AMBULATORY_CARE_PROVIDER_SITE_OTHER): Payer: BC Managed Care – PPO | Admitting: Family Medicine

## 2022-06-16 DIAGNOSIS — F331 Major depressive disorder, recurrent, moderate: Secondary | ICD-10-CM

## 2022-06-16 DIAGNOSIS — F900 Attention-deficit hyperactivity disorder, predominantly inattentive type: Secondary | ICD-10-CM | POA: Diagnosis not present

## 2022-06-16 MED ORDER — LISDEXAMFETAMINE DIMESYLATE 50 MG PO CAPS: 50.0000 mg | ORAL_CAPSULE | Freq: Every day | ORAL | 0 refills | Status: DC

## 2022-06-16 MED ORDER — BUPROPION HCL ER (XL) 300 MG PO TB24: 300.0000 mg | ORAL_TABLET | ORAL | 1 refills | Status: DC

## 2022-06-16 NOTE — Progress Notes (Signed)
MyChart Video visit  Subjective: CV:ELFY PCP: Raliegh Ip, DO BOF:[id number] Marcia Gonzalez is a 26 y.o. female. Patient provides verbal consent for consult held via video.  Due to COVID-19 pandemic this visit was conducted virtually. This visit type was conducted due to national recommendations for restrictions regarding the COVID-19 Pandemic (e.g. social distancing, sheltering in place) in an effort to limit this patient's exposure and mitigate transmission in our community. All issues noted in this document were discussed and addressed.  A physical exam was not performed with this format.   Location of patient: home Location of provider: [facility] Others present for call: son, Colt  1.  ADHD with depression anxiety Patient reports that she has seen a palpable difference in ADHD but she is still not quite at goal.  We raised her Vyvanse to 40 mg daily last visit.  Does not report any concerns of excessive dryness, constipation or anxiety exacerbation but she does feel moody such that her husband has noticed.  She is not sure if it is because of the Vyvanse or because her medications are subtherapeutic.  Currently treated with buspirone 15 mg 3 times daily, Wellbutrin 150 mg extended release once daily and Lexapro 20 mg daily.   ROS: Per HPI  Allergies  Allergen Reactions   Codeine Nausea Only   Augmentin [Amoxicillin-Pot Clavulanate] Other (See Comments)    Hallucinations   Misoprostol     Left blisters in mouth   Oxycodone Hives and Itching    Tylenol 3   Other Rash    Latex tape, causes reaction and blisters.   Past Medical History:  Diagnosis Date   Allergy    Anemia    Anxiety    Arthritis    Asthma    Depression    Endometriosis    GERD (gastroesophageal reflux disease)    Hypertension    gestational   Hypothyroidism    IIH (idiopathic intracranial hypertension)    Pregnancy induced hypertension    UTI (urinary tract infection)    Wells' syndrome     Current  Outpatient Medications:    acetaminophen (TYLENOL) 325 MG tablet, Take 2 tablets (650 mg total) by mouth every 4 (four) hours., Disp: , Rfl:    buPROPion (WELLBUTRIN XL) 150 MG 24 hr tablet, Take 1 tablet (150 mg total) by mouth every morning., Disp: 90 tablet, Rfl: 1   busPIRone (BUSPAR) 15 MG tablet, Take 1 tablet (15 mg total) by mouth 3 (three) times daily., Disp: 270 tablet, Rfl: 1   cyclobenzaprine (FLEXERIL) 10 MG tablet, Take 1 tablet (10 mg total) by mouth every 8 (eight) hours as needed for muscle spasms., Disp: 30 tablet, Rfl: 0   escitalopram (LEXAPRO) 20 MG tablet, Take 1 tablet (20 mg total) by mouth daily., Disp: 90 tablet, Rfl: 3   ferrous sulfate 325 (65 FE) MG tablet, Take 325 mg by mouth daily with breakfast., Disp: , Rfl:    ibuprofen (ADVIL) 600 MG tablet, Take 1 tablet (600 mg total) by mouth every 6 (six) hours., Disp: 30 tablet, Rfl: 1   lisdexamfetamine (VYVANSE) 40 MG capsule, Take 1 capsule (40 mg total) by mouth every morning., Disp: 30 capsule, Rfl: 0   pantoprazole (PROTONIX) 40 MG tablet, Take 1 tablet (40 mg total) by mouth daily as needed., Disp: 90 tablet, Rfl: 0   Prenatal Vit-Fe Fumarate-FA (PRENATAL MULTIVITAMIN) TABS tablet, Take 1 tablet by mouth daily at 12 noon., Disp: , Rfl:    SYNTHROID 88 MCG tablet, Take 1  tablet (88 mcg total) by mouth daily before breakfast., Disp: 90 tablet, Rfl: 3 Gen: nontoxic appearing female Psych: Mood stable, speech normal, affect appropriate.  Very pleasant, interactive.  Does not appear to be responding to internal stimuli   Assessment/ Plan: 26 y.o. female   Attention deficit hyperactivity disorder (ADHD), predominantly inattentive type - Plan: lisdexamfetamine (VYVANSE) 50 MG capsule  Depression, major, recurrent, moderate (HCC) - Plan: buPROPion (WELLBUTRIN XL) 300 MG 24 hr tablet  National narcotic database reviewed and there were no red flags.  Vyvanse advance to 50 mg daily.  I will also advance her Wellbutrin to  300 mg daily.  We discussed that we may need to consider consulting psychiatry for medication management and then I can take over when she found a regimen that would work well for her as she is already on 4 medications for her mental health at this time.  She was amenable to plan if this regimen does not work and we have scheduled close follow-up on 12/22.  Start time: 3:31p End time: 3:39p,  Total time spent on patient care (including video visit/ documentation): 8 minutes  Marcia Gonzalez Hulen Skains, DO [facility] [phone]

## 2022-06-19 ENCOUNTER — Ambulatory Visit: Payer: BC Managed Care – PPO | Admitting: Family Medicine

## 2022-06-20 ENCOUNTER — Telehealth: Payer: BC Managed Care – PPO | Admitting: Nurse Practitioner

## 2022-06-20 DIAGNOSIS — J069 Acute upper respiratory infection, unspecified: Secondary | ICD-10-CM | POA: Diagnosis not present

## 2022-06-20 MED ORDER — BENZONATATE 100 MG PO CAPS: 100.0000 mg | ORAL_CAPSULE | Freq: Three times a day (TID) | ORAL | 0 refills | Status: DC | PRN

## 2022-06-20 MED ORDER — FLUTICASONE PROPIONATE 50 MCG/ACT NA SUSP: 2.0000 | Freq: Every day | NASAL | 6 refills | Status: DC

## 2022-06-20 NOTE — Progress Notes (Signed)

## 2022-06-22 ENCOUNTER — Telehealth: Payer: BC Managed Care – PPO | Admitting: Emergency Medicine

## 2022-06-22 DIAGNOSIS — J40 Bronchitis, not specified as acute or chronic: Secondary | ICD-10-CM

## 2022-06-22 MED ORDER — PREDNISONE 10 MG PO TABS: 10.0000 mg | ORAL_TABLET | Freq: Every day | ORAL | 0 refills | Status: DC

## 2022-06-22 MED ORDER — AZITHROMYCIN 250 MG PO TABS: ORAL_TABLET | ORAL | 0 refills | Status: DC

## 2022-06-22 NOTE — Progress Notes (Signed)
We are sorry that you are not feeling well.  Here is how we plan to help!  Based on your presentation I believe you most likely have A cough due to bacteria.  When patients have a fever and a productive cough with a change in color or increased sputum production, we are concerned about bacterial bronchitis.  If left untreated it can progress to pneumonia.  If your symptoms do not improve with your treatment plan it is important that you contact your provider.   I have prescribed Azithromyin 250 mg: two tablets now and then one tablet daily for 4 additonal days      Prednisone 5 mg daily for 6 days (see taper instructions below)  From your responses in the eVisit questionnaire you describe inflammation in the upper respiratory tract which is causing a significant cough.  This is commonly called Bronchitis and has four common causes:   Allergies Viral Infections Acid Reflux Bacterial Infection Allergies, viruses and acid reflux are treated by controlling symptoms or eliminating the cause. An example might be a cough caused by taking certain blood pressure medications. You stop the cough by changing the medication. Another example might be a cough caused by acid reflux. Controlling the reflux helps control the cough.  USE OF BRONCHODILATOR ("RESCUE") INHALERS: There is a risk from using your bronchodilator too frequently.  The risk is that over-reliance on a medication which only relaxes the muscles surrounding the breathing tubes can reduce the effectiveness of medications prescribed to reduce swelling and congestion of the tubes themselves.  Although you feel brief relief from the bronchodilator inhaler, your asthma may actually be worsening with the tubes becoming more swollen and filled with mucus.  This can delay other crucial treatments, such as oral steroid medications. If you need to use a bronchodilator inhaler daily, several times per day, you should discuss this with your provider.  There are  probably better treatments that could be used to keep your asthma under control.     HOME CARE Only take medications as instructed by your medical team. Complete the entire course of an antibiotic. Drink plenty of fluids and get plenty of rest. Avoid close contacts especially the very young and the elderly Cover your mouth if you cough or cough into your sleeve. Always remember to wash your hands A steam or ultrasonic humidifier can help congestion.   GET HELP RIGHT AWAY IF: You develop worsening fever. You become short of breath You cough up blood. Your symptoms persist after you have completed your treatment plan MAKE SURE YOU  Understand these instructions. Will watch your condition. Will get help right away if you are not doing well or get worse.    Thank you for choosing an e-visit.  Your e-visit answers were reviewed by a board certified advanced clinical practitioner to complete your personal care plan. Depending upon the condition, your plan could have included both over the counter or prescription medications.  Please review your pharmacy choice. Make sure the pharmacy is open so you can pick up prescription now. If there is a problem, you may contact your provider through [employer] and have the prescription routed to another pharmacy.  Your safety is important to [place]. If you have drug allergies check your prescription carefully.   For the next 24 hours you can use MyChart to ask questions about today's visit, request a non-urgent call back, or ask for a work or school excuse. You will get an email in the next  two days asking about your experience. I hope that your e-visit has been valuable and will speed your recovery.  Approximately 5 minutes was used in reviewing the patient's chart, questionnaire, prescribing medications, and documentation.

## 2022-07-06 ENCOUNTER — Telehealth: Payer: BC Managed Care – PPO | Admitting: Family

## 2022-07-06 DIAGNOSIS — U071 COVID-19: Secondary | ICD-10-CM | POA: Diagnosis not present

## 2022-07-06 MED ORDER — FLUTICASONE PROPIONATE 50 MCG/ACT NA SUSP: 2.0000 | Freq: Every day | NASAL | 6 refills | Status: DC

## 2022-07-06 MED ORDER — BENZONATATE 100 MG PO CAPS: 100.0000 mg | ORAL_CAPSULE | Freq: Three times a day (TID) | ORAL | 0 refills | Status: DC | PRN

## 2022-07-06 NOTE — Progress Notes (Signed)
E-Visit  for Positive Covid Test Result  We are sorry you are not feeling well. We are here to help!  You have tested positive for COVID-19, meaning that you were infected with the novel coronavirus and could give the virus to others.  It is vitally important that you stay home so you do not spread it to others.      Please continue isolation at home, for at least 10 days since the start of your symptoms and until you have had 24 hours with no fever (without taking a fever reducer) and with improving of symptoms.  If you have no symptoms but tested positive (or all symptoms resolve after 5 days and you have no fever) you can leave your house but continue to wear a mask around others for an additional 5 days. If you have a fever,continue to stay home until you have had 24 hours of no fever. Most cases improve 5-10 days from onset but we have seen a small number of patients who have gotten worse after the 10 days.  Please be sure to watch for worsening symptoms and remain taking the proper precautions.   Go to the nearest hospital ED for assessment if fever/cough/breathlessness are severe or illness seems like a threat to life.    The following symptoms may appear 2-14 days after exposure: Fever Cough Shortness of breath or difficulty breathing Chills Repeated shaking with chills Muscle pain Headache Sore throat New loss of taste or smell Fatigue Congestion or runny nose Nausea or vomiting Diarrhea  You have been enrolled in MyChart Home Monitoring for COVID-19. Daily you will receive a questionnaire within the MyChart website. Our COVID-19 response team will be monitoring your responses daily.  You can use medication such as prescription cough medication called Tessalon Perles 100 mg. You may take 1-2 capsules every 8 hours as needed for cough and prescription for Fluticasone nasal spray 2 sprays in each nostril one time per day.   At this point I am sure your 3 month has already  been exposed. You can make sure you cover your mouth with coughing and sneezing and good hand hygiene. I will say most babies do very well, and usually have cold like symptoms. I would closely monitor them.   You may also take acetaminophen (Tylenol) as needed for fever.  HOME CARE: Only take medications as instructed by your medical team. Drink plenty of fluids and get plenty of rest. A steam or ultrasonic humidifier can help if you have congestion.   GET HELP RIGHT AWAY IF YOU HAVE EMERGENCY WARNING SIGNS.  Call 911 or proceed to your closest emergency facility if: You develop worsening high fever. Trouble breathing Bluish lips or face Persistent pain or pressure in the chest New confusion Inability to wake or stay awake You cough up blood. Your symptoms become more severe Inability to hold down food or fluids  This list is not all possible symptoms. Contact your medical provider for any symptoms that are severe or concerning to you.    Your e-visit answers were reviewed by a board certified advanced clinical practitioner to complete your personal care plan.  Depending on the condition, your plan could have included both over the counter or prescription medications.  If there is a problem please reply once you have received a response from your provider.  Your safety is important to [place].  If you have drug allergies check your prescription carefully.    You can use MyChart  to ask questions about today's visit, request a non-urgent call back, or ask for a work or school excuse for 24 hours related to this e-Visit. If it has been greater than 24 hours you will need to follow up with your provider, or enter a new e-Visit to address those concerns. You will get an e-mail in the next two days asking about your experience.  I hope that your e-visit has been valuable and will speed your recovery. Thank you for using e-visits.   Approximately 5 minutes was spent documenting and reviewing  patient's chart.

## 2022-07-10 ENCOUNTER — Other Ambulatory Visit: Payer: BC Managed Care – PPO

## 2022-07-10 ENCOUNTER — Other Ambulatory Visit: Payer: Self-pay | Admitting: Family Medicine

## 2022-07-10 ENCOUNTER — Telehealth: Payer: Self-pay | Admitting: Family Medicine

## 2022-07-10 DIAGNOSIS — E063 Autoimmune thyroiditis: Secondary | ICD-10-CM | POA: Diagnosis not present

## 2022-07-10 DIAGNOSIS — F411 Generalized anxiety disorder: Secondary | ICD-10-CM

## 2022-07-10 DIAGNOSIS — F331 Major depressive disorder, recurrent, moderate: Secondary | ICD-10-CM

## 2022-07-10 NOTE — Addendum Note (Signed)
Addended by: Raliegh Ip on: 07/10/2022 01:29 PM   Modules accepted: Orders

## 2022-07-10 NOTE — Telephone Encounter (Signed)
Patient wants referral to psych

## 2022-07-11 LAB — CMP14+EGFR
ALT: 16 IU/L (ref 0–32)
AST: 15 IU/L (ref 0–40)
Albumin/Globulin Ratio: 1.6 (ref 1.2–2.2)
Albumin: 4.2 g/dL (ref 4.0–5.0)
Alkaline Phosphatase: 76 IU/L (ref 44–121)
BUN/Creatinine Ratio: 14 (ref 9–23)
BUN: 10 mg/dL (ref 6–20)
Bilirubin Total: 0.5 mg/dL (ref 0.0–1.2)
CO2: 23 mmol/L (ref 20–29)
Calcium: 9.1 mg/dL (ref 8.7–10.2)
Chloride: 100 mmol/L (ref 96–106)
Creatinine, Ser: 0.7 mg/dL (ref 0.57–1.00)
Globulin, Total: 2.6 g/dL (ref 1.5–4.5)
Glucose: 95 mg/dL (ref 70–99)
Potassium: 4.2 mmol/L (ref 3.5–5.2)
Sodium: 136 mmol/L (ref 134–144)
Total Protein: 6.8 g/dL (ref 6.0–8.5)
eGFR: 124 mL/min/{1.73_m2} (ref >59)

## 2022-07-11 LAB — TSH: TSH: 0.236 u[IU]/mL — ABNORMAL LOW (ref 0.450–4.500)

## 2022-07-11 LAB — T4, FREE: Free T4: 1.36 ng/dL (ref 0.82–1.77)

## 2022-07-16 ENCOUNTER — Encounter: Payer: Self-pay | Admitting: Family Medicine

## 2022-07-27 ENCOUNTER — Telehealth: Payer: Self-pay | Admitting: Family Medicine

## 2022-07-27 NOTE — Telephone Encounter (Signed)
Patient states that she passed out Sunday and states that her husband stated that she was shaking all over almost like a seizure. Patient advised that if it happens again she needs to have her husband call 911 immediately. Patient given appointment for 01/09 @ 8:30 with Lajuana Ripple.

## 2022-07-28 ENCOUNTER — Ambulatory Visit: Payer: BC Managed Care – PPO | Admitting: Family Medicine

## 2022-07-28 ENCOUNTER — Encounter: Payer: Self-pay | Admitting: Family Medicine

## 2022-07-28 ENCOUNTER — Telehealth: Payer: Self-pay | Admitting: Family Medicine

## 2022-07-28 NOTE — Progress Notes (Deleted)
Subjective: Marcia Gonzalez with some seizure-like activity PCP: Raliegh Ip, DO HPI: Marcia Gonzalez is a 27 year old female with a history of idiopathic intracranial hypertension, hypertension, hashimoto's thyroiditis, depression, and anxiety who presents one day after an episode of syncope and seizure-like activity. Patient reports that.....   What preceded you passing out? *** Were you conscious through the episode? *** Did you bite your tongue? *** Did you poop/pee on yourself? *** Any recent trauma? *** Were there  rhythmic jerking movements? *** How long did the seizure last? *** Were you confused when you came-to? *** Did those movements affect your whole body or one side?  *** Aura: were there changes to your thoughts, senses, or awareness like: Flashing or flickering lights, blurry vision, dark spots, partial vision loss? *** How has stress been in your life recently? Did you feel a sense of panic before the onset of your seizure? *** Did you feel lightheadedness, warm or cold feeing, sweating, palpitations, pallor before your lost consciousness? *** Have you ever had seizures before? ***  Patient reports that she was ____ before she lost consciousness. She reports prior to losing consciousness she felt mildly lightheaded and warm. She did not see flashing or flickering lights, blurry vision, or dark spots.  The onset of her loss of consciousness was sudden. She lost consciousness for approximately 1 minute. Her husband reports that during this episode she demonstrated small twitching movements of her arms and legs bilaterally. Her eyes were closed during the episode. She did not bite her tongue, have defecation or urination during the episode. Marcia Gonzalez was not confused when she regained consciousness. She reports that she was not conscious throughout the event. Patient reports that she has had an increase in stress recently due to _____.   Given that patient had prodrome symptoms  of warmth, sweating, palpitation, and pallor preceding her transient loss of consciousness, most likely this is convulsive syncope. Unlikely to be epilepsy at this time given that she has not had more than one episode of seizure-like activity. Less likely to be focal seizure with impaired awareness given that the patient did not appear to be awake during the episode, there were not automatisms, the episode lasted more than three minutes, and there was no post-ictal state. Possibly psychogenic ***if  maintained awareness throughout the episode. Given that this is a solitary episode and most likely not generalized or focal seizure, I do not think EEG monitoring is necessary at this time. Discussed that if patient has recurrence of symptoms she should go to the emergency room.   1. ***   ROS: Per HPI  Allergies  Allergen Reactions   Codeine Nausea Only   Augmentin [Amoxicillin-Pot Clavulanate] Other (See Comments)    Hallucinations   Misoprostol     Left blisters in mouth   Oxycodone Hives and Itching    Tylenol 3   Other Rash    Latex tape, causes reaction and blisters.   Past Medical History:  Diagnosis Date   Allergy    Anemia    Anxiety    Arthritis    Asthma    Depression    Endometriosis    GERD (gastroesophageal reflux disease)    Hypertension    gestational   Hypothyroidism    IIH (idiopathic intracranial hypertension)    Pregnancy induced hypertension    UTI (urinary tract infection)    Wells' syndrome     Current Outpatient Medications:    acetaminophen (TYLENOL) 325 MG tablet, Take  2 tablets (650 mg total) by mouth every 4 (four) hours., Disp: , Rfl:    busPIRone (BUSPAR) 15 MG tablet, Take 1 tablet (15 mg total) by mouth 3 (three) times daily., Disp: 270 tablet, Rfl: 1   cyclobenzaprine (FLEXERIL) 10 MG tablet, Take 1 tablet (10 mg total) by mouth every 8 (eight) hours as needed for muscle spasms., Disp: 30 tablet, Rfl: 0   escitalopram (LEXAPRO) 20 MG tablet, Take  1 tablet (20 mg total) by mouth daily., Disp: 90 tablet, Rfl: 3   ferrous sulfate 325 (65 FE) MG tablet, Take 325 mg by mouth daily with breakfast., Disp: , Rfl:    fluticasone (FLONASE) 50 MCG/ACT nasal spray, Place 2 sprays into both nostrils daily., Disp: 16 g, Rfl: 6   ibuprofen (ADVIL) 600 MG tablet, Take 1 tablet (600 mg total) by mouth every 6 (six) hours., Disp: 30 tablet, Rfl: 1   pantoprazole (PROTONIX) 40 MG tablet, Take 1 tablet (40 mg total) by mouth daily as needed., Disp: 90 tablet, Rfl: 0   Prenatal Vit-Fe Fumarate-FA (PRENATAL MULTIVITAMIN) TABS tablet, Take 1 tablet by mouth daily at 12 noon., Disp: , Rfl:    SYNTHROID 88 MCG tablet, Take 1 tablet (88 mcg total) by mouth daily before breakfast., Disp: 90 tablet, Rfl: 3 Social History   Socioeconomic History   Marital status: Married    Spouse name: Jeri Modena   Number of children: 0   Years of education: 12   Highest education level: High school graduate  Occupational History   Not on file  Tobacco Use   Smoking status: Never    Passive exposure: Never   Smokeless tobacco: Never  Vaping Use   Vaping Use: Never used  Substance and Sexual Activity   Alcohol use: Not Currently   Drug use: Never   Sexual activity: Yes    Birth control/protection: None  Other Topics Concern   Not on file  Social History Narrative   Not on file   Social Determinants of Health   Financial Resource Strain: Low Risk  (04/13/2022)   Overall Financial Resource Strain (CARDIA)    Difficulty of Paying Living Expenses: Not very hard  Food Insecurity: No Food Insecurity (04/13/2022)   Hunger Vital Sign    Worried About Running Out of Food in the Last Year: Never true    Ran Out of Food in the Last Year: Never true  Transportation Needs: No Transportation Needs (04/13/2022)   PRAPARE - Administrator, Civil Service (Medical): No    Lack of Transportation (Non-Medical): No  Physical Activity: Insufficiently Active (04/13/2022)    Exercise Vital Sign    Days of Exercise per Week: 1 day    Minutes of Exercise per Session: 10 min  Stress: No Stress Concern Present (04/13/2022)   [employer] of Occupational Health - Occupational Stress Questionnaire    Feeling of Stress : Only a little  Social Connections: Socially Integrated (04/13/2022)   Social Connection and Isolation Panel [NHANES]    Frequency of Communication with Friends and Family: More than three times a week    Frequency of Social Gatherings with Friends and Family: Once a week    Attends Religious Services: More than 4 times per year    Active Member of [employer] or Organizations: Yes    Attends Banker Meetings: More than 4 times per year    Marital Status: Married  Catering manager Violence: Not At Risk (04/13/2022)   Humiliation, Afraid, Rape,  and Kick questionnaire    Fear of Current or Ex-Partner: No    Emotionally Abused: No    Physically Abused: No    Sexually Abused: No   Family History  Problem Relation Age of Onset   Kidney disease Mother    Hypertension Mother    Hyperlipidemia Mother    Depression Mother    Cancer Mother        cervical   Arthritis Mother    Anxiety disorder Mother    Depression Father    Anxiety disorder Father    Asthma Brother    COPD Maternal Grandmother    Arthritis Maternal Grandmother     Objective: Office vital signs reviewed. There were no vitals taken for this visit.  Physical Exam  *** General: Awake, alert, well nourished, No acute distress HEENT: Normal    Neck: No masses palpated. No lymphadenopathy    Ears: Tympanic membranes intact, normal light reflex, no erythema, no bulging    Eyes: PERRLA, extraocular membranes intact, sclera white    Nose: nasal turbinates moist, no nasal discharge ***    Throat: moist mucus membranes, no erythema, no tonsillar exudate.  Airway is patent Cardio: regular rate and rhythm, S1S2 heard, no murmurs appreciated Pulm: clear to auscultation  bilaterally, no wheezes, rhonchi or rales; normal work of breathing on room air GI: soft, non-tender, non-distended, bowel sounds present x4, no hepatomegaly, no splenomegaly, no masses Extremities: warm, well perfused, No edema, cyanosis or clubbing; +1 pulses bilaterally Skin: dry; intact; no rashes or lesions  Assessment/ Plan: 27 y.o. female   ***  No orders of the defined types were placed in this encounter.  No orders of the defined types were placed in this encounter.   Stephani Police, MS3

## 2022-07-28 NOTE — Telephone Encounter (Signed)
Spoke with husband and scheduled an appointment with Dr. Lajuana Ripple tomorrow at 1:50 pm. Was advised to go to ER but husband states his wife refuses to go.

## 2022-07-29 ENCOUNTER — Encounter: Payer: Self-pay | Admitting: Family Medicine

## 2022-07-29 ENCOUNTER — Telehealth: Payer: Self-pay | Admitting: Family Medicine

## 2022-07-29 ENCOUNTER — Ambulatory Visit (HOSPITAL_COMMUNITY)
Admission: RE | Admit: 2022-07-29 | Discharge: 2022-07-29 | Disposition: A | Discharge status: Home / Self Care | Payer: BC Managed Care – PPO | Source: Ambulatory Visit | Attending: Family Medicine | Admitting: Family Medicine

## 2022-07-29 ENCOUNTER — Ambulatory Visit (INDEPENDENT_AMBULATORY_CARE_PROVIDER_SITE_OTHER): Payer: BC Managed Care – PPO | Admitting: Family Medicine

## 2022-07-29 ENCOUNTER — Other Ambulatory Visit: Payer: Self-pay | Admitting: Family Medicine

## 2022-07-29 VITALS — Temp 97.8°F | Ht 65.0 in | Wt 230.2 lb

## 2022-07-29 DIAGNOSIS — R202 Paresthesia of skin: Secondary | ICD-10-CM

## 2022-07-29 DIAGNOSIS — R569 Unspecified convulsions: Secondary | ICD-10-CM

## 2022-07-29 DIAGNOSIS — R519 Headache, unspecified: Secondary | ICD-10-CM | POA: Diagnosis not present

## 2022-07-29 DIAGNOSIS — F40240 Claustrophobia: Secondary | ICD-10-CM

## 2022-07-29 DIAGNOSIS — G43909 Migraine, unspecified, not intractable, without status migrainosus: Secondary | ICD-10-CM | POA: Diagnosis not present

## 2022-07-29 DIAGNOSIS — G43719 Chronic migraine without aura, intractable, without status migrainosus: Secondary | ICD-10-CM | POA: Insufficient documentation

## 2022-07-29 DIAGNOSIS — R55 Syncope and collapse: Secondary | ICD-10-CM | POA: Diagnosis not present

## 2022-07-29 MED ORDER — BUPROPION HCL ER (SR) 150 MG PO TB12: ORAL_TABLET | ORAL | 0 refills | Status: DC

## 2022-07-29 MED ORDER — LORAZEPAM 0.5 MG PO TABS: ORAL_TABLET | ORAL | 0 refills | Status: DC

## 2022-07-29 NOTE — Telephone Encounter (Signed)
done

## 2022-07-29 NOTE — Telephone Encounter (Signed)
Patient aware and verbalizes understanding. 

## 2022-07-29 NOTE — Progress Notes (Signed)
Subjective: Marcia Gonzalez PCP: Marcia Ip, DO FIE:PPIRJJO Fleer is a 27 y.o. female presenting to clinic today for:  1. Syncope Patient is accompanied today's visit by her spouse.  She has been having a 3 to 4-week history of progressive migraine headaches such that she has photophobia, phonophobia and at times severe right-sided pain.  She has also noticed some associated arm numbness and tingling.  Predominantly on the left side but it can occur on the right side.  Over the weekend, her husband observed her feeling dizzy Saturday evening and she passed out x 2.  No seizure-like activity was observed then.  She did not hit her head  Then on Sunday evening she started having jerking while she was laying down.  This lasted for about 20 to 25 seconds and then would pause before recurring.  It occurred a total of 5 times before stopping.  She does feel like she lost urine continence during these episodes.  Her husband brings a video of these jerking movements today.  She has been having increasing fatigue over the last several weeks.  This is despite adequate sleep at nighttime.  Of note she was sick with COVID-19 last month.  No other changes in meds or activities.  Treated with Wellbutrin which is not a new medication for her.  No known family history of seizure   ROS: Per HPI  Allergies  Allergen Reactions   Codeine Nausea Only   Amoxicillin-Pot Clavulanate Other (See Comments)    Hallucinations   Misoprostol     Left blisters in mouth   Oxycodone Hives and Itching    Tylenol 3   Pollen Extract Other (See Comments)   Other Rash    Latex tape, causes reaction and blisters.   Past Medical History:  Diagnosis Date   Allergy    Anemia    Anxiety    Arthritis    Asthma    Depression    Endometriosis    GERD (gastroesophageal reflux disease)    Hypertension    gestational   Hypothyroidism    IIH (idiopathic intracranial hypertension)    Pregnancy induced hypertension     UTI (urinary tract infection)    Wells' syndrome     Current Outpatient Medications:    acetaminophen (TYLENOL) 325 MG tablet, Take 2 tablets (650 mg total) by mouth every 4 (four) hours., Disp: , Rfl:    busPIRone (BUSPAR) 15 MG tablet, Take 1 tablet (15 mg total) by mouth 3 (three) times daily., Disp: 270 tablet, Rfl: 1   escitalopram (LEXAPRO) 20 MG tablet, Take 1 tablet (20 mg total) by mouth daily., Disp: 90 tablet, Rfl: 3   ferrous sulfate 325 (65 FE) MG tablet, Take 325 mg by mouth daily with breakfast., Disp: , Rfl:    fluticasone (FLONASE) 50 MCG/ACT nasal spray, Place 2 sprays into both nostrils daily., Disp: 16 g, Rfl: 6   ibuprofen (ADVIL) 600 MG tablet, Take 1 tablet (600 mg total) by mouth every 6 (six) hours., Disp: 30 tablet, Rfl: 1   pantoprazole (PROTONIX) 40 MG tablet, Take 1 tablet (40 mg total) by mouth daily as needed., Disp: 90 tablet, Rfl: 0   Prenatal Vit-Fe Fumarate-FA (PRENATAL MULTIVITAMIN) TABS tablet, Take 1 tablet by mouth daily at 12 noon., Disp: , Rfl:    SYNTHROID 88 MCG tablet, Take 1 tablet (88 mcg total) by mouth daily before breakfast., Disp: 90 tablet, Rfl: 3   cyclobenzaprine (FLEXERIL) 10 MG tablet, Take 1 tablet (10 mg  total) by mouth every 8 (eight) hours as needed for muscle spasms. (Patient not taking: Reported on 07/29/2022), Disp: 30 tablet, Rfl: 0 Social History   Socioeconomic History   Marital status: Married    Spouse name: Marcia Gonzalez   Number of children: 0   Years of education: 12   Highest education level: High school graduate  Occupational History   Not on file  Tobacco Use   Smoking status: Never    Passive exposure: Never   Smokeless tobacco: Never  Vaping Use   Vaping Use: Never used  Substance and Sexual Activity   Alcohol use: Not Currently   Drug use: Never   Sexual activity: Yes    Birth control/protection: None  Other Topics Concern   Not on file  Social History Narrative   Not on file   Social Determinants of Health    Financial Resource Strain: Low Risk  (04/13/2022)   Overall Financial Resource Strain (CARDIA)    Difficulty of Paying Living Expenses: Not very hard  Food Insecurity: No Food Insecurity (04/13/2022)   Hunger Vital Sign    Worried About Running Out of Food in the Last Year: Never true    Ran Out of Food in the Last Year: Never true  Transportation Needs: No Transportation Needs (04/13/2022)   PRAPARE - Hydrologist (Medical): No    Lack of Transportation (Non-Medical): No  Physical Activity: Insufficiently Active (04/13/2022)   Exercise Vital Sign    Days of Exercise per Week: 1 day    Minutes of Exercise per Session: 10 min  Stress: No Stress Concern Present (04/13/2022)   [place]    Feeling of Stress : Only a little  Social Connections: Socially Integrated (04/13/2022)   Social Connection and Isolation Panel [NHANES]    Frequency of Communication with Friends and Family: More than three times a week    Frequency of Social Gatherings with Friends and Family: Once a week    Attends Religious Services: More than 4 times per year    Active Member of [employer] or Organizations: Yes    Attends Music therapist: More than 4 times per year    Marital Status: Married  Human resources officer Violence: Not At Risk (04/13/2022)   Humiliation, Afraid, Rape, and Kick questionnaire    Fear of Current or Ex-Partner: No    Emotionally Abused: No    Physically Abused: No    Sexually Abused: No   Family History  Problem Relation Age of Onset   Kidney disease Mother    Hypertension Mother    Hyperlipidemia Mother    Depression Mother    Cancer Mother        cervical   Arthritis Mother    Anxiety disorder Mother    Depression Father    Anxiety disorder Father    Asthma Brother    COPD Maternal Grandmother    Arthritis Maternal Grandmother     Objective: Office vital signs reviewed. Temp  97.8 F (36.6 C) (Temporal)   Ht 5\' 5"  (1.651 m)   Wt 230 lb 3.2 oz (104.4 kg)   SpO2 96%   BMI 38.31 kg/m   Physical Examination:  General: Awake, alert, well nourished, No acute distress HEENT: sclera white, MMM Cardio: regular rate and rhythm, S1S2 heard, no murmurs appreciated Pulm: clear to auscultation bilaterally, no wheezes, rhonchi or rales; normal work of breathing on room air Neuro: 5/5 UE  and LE Strength and LEFT lateral upper arm with decreased light touch sensation compared to right.  No nystagmus.  Cranial nerves II through XII grossly intact.  She has normal upper and lower cerebellar testing.  Negative Romberg  Orthostatic VS for the past 72 hrs (Last 3 readings):  Orthostatic BP Patient Position BP Location Cuff Size Orthostatic Pulse  07/29/22 1404 128/84 Sitting Right Arm Large 82  07/29/22 1401 126/87 Supine Right Arm Large 83    Assessment/ Plan: 27 y.o. female   Syncope and collapse - Plan: EKG 12-Lead, LONG TERM MONITOR (3-14 DAYS), Magnesium, CMP14+EGFR, TSH, T4, Free, CBC, Ambulatory referral to Neurology, MR Brain Wo Contrast  Seizure-like activity (HCC) - Plan: EKG 12-Lead, LONG TERM MONITOR (3-14 DAYS), Magnesium, CMP14+EGFR, TSH, T4, Free, CBC, Ambulatory referral to Neurology, MR Brain Wo Contrast  Intractable chronic migraine without aura and without status migrainosus - Plan: Ambulatory referral to Neurology, MR Brain Wo Contrast  Paresthesia of left arm - Plan: Ambulatory referral to Neurology, MR Brain Wo Contrast  Her physical exam was notable for some numbness about left upper extremity.  It does not follow a dermatomal pattern.  She otherwise had a nonfocal exam.  I did observe the video provided by her husband which does show some tonic-clonic jerks.  Given progressive migraine headaches, unilateral numbness of that left arm and seizure-like activity stat MRI of the brain and stat referral neurology placed.  Of note she has had recent COVID  infection which does pose question if she has had some type of hypercoagulable injury to the brain.  I would like her to start weaning from the Wellbutrin since this lowers seizure threshold.  She will go down to the sustained-release twice daily for 2 weeks then once daily for 2 weeks and then discontinue.  She understands that she is to seek immediate medical attention in the ER if symptoms recur.  With regards to me, EKG obtained today.  Plan for Zio patch following MRI of brain.  Will obtain labs to further evaluate this as well but again may be manifestation of the above  Orders Placed This Encounter  Procedures   MR Brain Wo Contrast    Standing Status:   Future    Standing Expiration Date:   07/30/2023    Order Specific Question:   What is the patient's sedation requirement?    Answer:   No Sedation    Order Specific Question:   Does the patient have a pacemaker or implanted devices?    Answer:   No    Order Specific Question:   Preferred imaging location?    Answer:   Internal   Magnesium   CMP14+EGFR   TSH   T4, Free   CBC   Ambulatory referral to Neurology    Referral Priority:   Urgent    Referral Type:   Consultation    Referral Reason:   Specialty Services Required    Requested Specialty:   Neurology    Number of Visits Requested:   1   LONG TERM MONITOR (3-14 DAYS)    Standing Status:   Future    Standing Expiration Date:   07/30/2023    Order Specific Question:   Where should this test be performed?    Answer:   Internal    Order Specific Question:   Does the patient have an implanted cardiac device?    Answer:   No    Order Specific Question:   Prescribed days of  wear    Answer:   22    Order Specific Question:   Type of enrollment    Answer:   Clinic Enrollment   EKG 12-Lead   Meds ordered this encounter  Medications   buPROPion (WELLBUTRIN SR) 150 MG 12 hr tablet    Sig: Take 1 tablet (150 mg total) by mouth 2 (two) times daily for 14 days, THEN 1 tablet (150  mg total) daily for 14 days. Then STOP.    Dispense:  42 tablet    Refill:  Blyn, DO [place] [phone]

## 2022-07-29 NOTE — Telephone Encounter (Signed)
Pt needs Dr Lajuana Ripple to send in medicine to help relax her so she can do the MRI at [facility] today. Says she is claustrophobic and can't do MRI without something to take.  Needs Rx sent to [facility] pharmacy in [place].

## 2022-07-29 NOTE — Patient Instructions (Addendum)
Will apply ZIO patch (looks for abnormal heart rhythm) following MRI brain.  Seizure, Adult A seizure is a sudden burst of abnormal electrical and chemical activity in the brain. Seizures usually last from 30 seconds to 2 minutes.  What are the causes? Common causes of this condition include: Fever or infection. Problems that affect the brain. These may include: A brain or head injury. Bleeding in the brain. A brain tumor. Low levels of blood sugar or salt. Kidney problems or liver problems. Conditions that are passed from parent to child (are inherited). Problems with a substance, such as: Having a reaction to a drug or a medicine. Stopping the use of a substance all of a sudden (withdrawal). A stroke. Disorders that affect how you develop. Sometimes, the cause may not be known.  What increases the risk? Having someone in your family who has epilepsy. In this condition, seizures happen again and again over time. They have no clear cause. Having had a tonic-clonic seizure before. This type of seizure causes you to: Tighten the muscles of the whole body. Lose consciousness. Having had a head injury or strokes before. Having had a lack of oxygen at birth. What are the signs or symptoms? There are many types of seizures. The symptoms vary depending on the type of seizure you have. Symptoms during a seizure Shaking that you cannot control (convulsions) with fast, jerky movements of muscles. Stiffness of the body. Breathing problems. Feeling mixed up (confused). Staring or not responding to sound or touch. Head nodding. Eyes that blink, flutter, or move fast. Drooling, grunting, or making clicking sounds with your mouth Losing control of when you pee or poop. Symptoms before a seizure Feeling afraid, nervous, or worried. Feeling like you may vomit. Feeling like: You are moving when you are not. Things around you are moving when they are not. Feeling like you saw or heard  something before (dj vu). Odd tastes or smells. Changes in how you see. You may see flashing lights or spots. Symptoms after a seizure Feeling confused. Feeling sleepy. Headache. Sore muscles. How is this treated? If your seizure stops on its own, you will not need treatment. If your seizure lasts longer than 5 minutes, you will normally need treatment. Treatment may include: Medicines given through an IV tube. Avoiding things, such as medicines, that are known to cause your seizures. Medicines to prevent seizures. A device to prevent or control seizures. Surgery. A diet low in carbohydrates and high in fat (ketogenic diet). Follow these instructions at home: Medicines Take over-the-counter and prescription medicines only as told by your doctor. Avoid foods or drinks that may keep your medicine from working, such as alcohol. Activity Follow instructions about driving, swimming, or doing things that would be dangerous if you had another seizure. Wait until your doctor says it is safe for you to do these things. If you live in the [place] ask your local department of motor vehicles when you can drive. Get a lot of rest. Teaching others  Teach friends and family what to do when you have a seizure. They should: Help you get down to the ground. Protect your head and body. Loosen any clothing around your neck. Turn you on your side. Know whether or not you need emergency care. Stay with you until you are better. Also, tell them what not to do if you have a seizure. Tell them: They should not hold you down. They should not put anything in your mouth. General instructions Avoid anything  that gives you seizures. Keep a seizure diary. Write down: What you remember about each seizure. What you think caused each seizure. Keep all follow-up visits. Contact a doctor if: You have another seizure or seizures. Call the doctor each time you have a seizure. The pattern of your seizures  changes. You keep having seizures with treatment. You have symptoms of being sick or having an infection. You are not able to take your medicine. Get help right away if: You have any of these problems: A seizure that lasts longer than 5 minutes. Many seizures in a row and you do not feel better between seizures. A seizure that makes it harder to breathe. A seizure and you can no longer speak or use part of your body. You do not wake up right after a seizure. You get hurt during a seizure. You feel confused or have pain right after a seizure. These symptoms may be an emergency. Get help right away. Call your local emergency services (911 in the [place] Do not wait to see if the symptoms will go away. Do not drive yourself to the hospital. Summary A seizure is a sudden burst of abnormal electrical and chemical activity in the brain. Seizures normally last from 30 seconds to 2 minutes. Causes of seizures include illness, injury to the head, low levels of blood sugar or salt, and certain conditions. Most seizures will stop on their own in less than 5 minutes. Seizures that last longer than 5 minutes are a medical emergency and need treatment right away. Many medicines are used to treat seizures. Take over-the-counter and prescription medicines only as told by your doctor. This information is not intended to replace advice given to you by your health care provider. Make sure you discuss any questions you have with your health care provider. Document Revised: 01/12/2020 Document Reviewed: 01/12/2020 Elsevier Patient Education  Marcia Gonzalez.

## 2022-07-30 ENCOUNTER — Ambulatory Visit (INDEPENDENT_AMBULATORY_CARE_PROVIDER_SITE_OTHER): Payer: Medicaid Other | Admitting: *Deleted

## 2022-07-30 ENCOUNTER — Other Ambulatory Visit (INDEPENDENT_AMBULATORY_CARE_PROVIDER_SITE_OTHER): Payer: BC Managed Care – PPO

## 2022-07-30 DIAGNOSIS — R55 Syncope and collapse: Secondary | ICD-10-CM | POA: Diagnosis not present

## 2022-07-30 DIAGNOSIS — R569 Unspecified convulsions: Secondary | ICD-10-CM

## 2022-07-30 LAB — CMP14+EGFR
ALT: 12 IU/L (ref 0–32)
AST: 16 IU/L (ref 0–40)
Albumin/Globulin Ratio: 1.7 (ref 1.2–2.2)
Albumin: 4.1 g/dL (ref 4.0–5.0)
Alkaline Phosphatase: 80 IU/L (ref 44–121)
BUN/Creatinine Ratio: 15 (ref 9–23)
BUN: 11 mg/dL (ref 6–20)
Bilirubin Total: 0.2 mg/dL (ref 0.0–1.2)
CO2: 24 mmol/L (ref 20–29)
Calcium: 9.3 mg/dL (ref 8.7–10.2)
Chloride: 103 mmol/L (ref 96–106)
Creatinine, Ser: 0.73 mg/dL (ref 0.57–1.00)
Globulin, Total: 2.4 g/dL (ref 1.5–4.5)
Glucose: 84 mg/dL (ref 70–99)
Potassium: 4.3 mmol/L (ref 3.5–5.2)
Sodium: 139 mmol/L (ref 134–144)
Total Protein: 6.5 g/dL (ref 6.0–8.5)
eGFR: 118 mL/min/{1.73_m2} (ref >59)

## 2022-07-30 LAB — CBC
Hematocrit: 37.9 % (ref 34.0–46.6)
Hemoglobin: 12.5 g/dL (ref 11.1–15.9)
MCH: 27.9 pg (ref 26.6–33.0)
MCHC: 33 g/dL (ref 31.5–35.7)
MCV: 85 fL (ref 79–97)
Platelets: 365 10*3/uL (ref 150–450)
RBC: 4.48 x10E6/uL (ref 3.77–5.28)
RDW: 13 % (ref 11.7–15.4)
WBC: 6.7 10*3/uL (ref 3.4–10.8)

## 2022-07-30 LAB — MAGNESIUM: Magnesium: 2 mg/dL (ref 1.6–2.3)

## 2022-07-30 LAB — T4, FREE: Free T4: 1.36 ng/dL (ref 0.82–1.77)

## 2022-07-30 LAB — TSH: TSH: 0.665 u[IU]/mL (ref 0.450–4.500)

## 2022-07-30 NOTE — Progress Notes (Signed)
Zio monitor placed. Patient given instructions. Patient verbalized understanding

## 2022-07-31 ENCOUNTER — Telehealth: Payer: Self-pay | Admitting: Family Medicine

## 2022-07-31 MED ORDER — ESCITALOPRAM OXALATE 20 MG PO TABS: 20.0000 mg | ORAL_TABLET | Freq: Every day | ORAL | 3 refills | Status: DC

## 2022-07-31 NOTE — Telephone Encounter (Signed)
Pt wants her Lexapro Rx voided at [facility] and sent to [facility] in [place].

## 2022-07-31 NOTE — Telephone Encounter (Signed)
Meds sent [facility] per pt request

## 2022-08-06 ENCOUNTER — Ambulatory Visit: Payer: Medicaid Other | Admitting: Neurology

## 2022-08-07 ENCOUNTER — Other Ambulatory Visit: Payer: Self-pay | Admitting: Family Medicine

## 2022-08-07 ENCOUNTER — Telehealth (INDEPENDENT_AMBULATORY_CARE_PROVIDER_SITE_OTHER): Payer: BC Managed Care – PPO | Admitting: Psychiatry

## 2022-08-07 ENCOUNTER — Telehealth (HOSPITAL_COMMUNITY): Payer: Self-pay | Admitting: Licensed Clinical Social Worker

## 2022-08-07 ENCOUNTER — Encounter (HOSPITAL_COMMUNITY): Payer: Self-pay | Admitting: Psychiatry

## 2022-08-07 DIAGNOSIS — Z9151 Personal history of suicidal behavior: Secondary | ICD-10-CM | POA: Diagnosis not present

## 2022-08-07 DIAGNOSIS — F603 Borderline personality disorder: Secondary | ICD-10-CM

## 2022-08-07 DIAGNOSIS — F50814 Binge eating disorder, in remission: Secondary | ICD-10-CM | POA: Insufficient documentation

## 2022-08-07 DIAGNOSIS — F332 Major depressive disorder, recurrent severe without psychotic features: Secondary | ICD-10-CM

## 2022-08-07 DIAGNOSIS — F4310 Post-traumatic stress disorder, unspecified: Secondary | ICD-10-CM

## 2022-08-07 DIAGNOSIS — F40240 Claustrophobia: Secondary | ICD-10-CM

## 2022-08-07 DIAGNOSIS — F410 Panic disorder [episodic paroxysmal anxiety] without agoraphobia: Secondary | ICD-10-CM

## 2022-08-07 DIAGNOSIS — R45851 Suicidal ideations: Secondary | ICD-10-CM

## 2022-08-07 DIAGNOSIS — F5081 Binge eating disorder: Secondary | ICD-10-CM

## 2022-08-07 DIAGNOSIS — R1112 Projectile vomiting: Secondary | ICD-10-CM

## 2022-08-07 DIAGNOSIS — F411 Generalized anxiety disorder: Secondary | ICD-10-CM

## 2022-08-07 HISTORY — DX: Suicidal ideations: R45.851

## 2022-08-07 HISTORY — DX: Binge eating disorder, in remission: F50.814

## 2022-08-07 MED ORDER — ARIPIPRAZOLE 2 MG PO TABS: 2.0000 mg | ORAL_TABLET | Freq: Every day | ORAL | 0 refills | Status: DC

## 2022-08-07 MED ORDER — PANTOPRAZOLE SODIUM 40 MG PO TBEC: 40.0000 mg | DELAYED_RELEASE_TABLET | Freq: Every day | ORAL | 3 refills | Status: DC | PRN

## 2022-08-07 NOTE — Patient Instructions (Addendum)
We created a safety plan today. Plan on being around others at all times while we are still dealing with the amount of suicidal ideation that you are having. Should those thoughts worsen at all, please report directly to the [facility] Emergency department in [place]. You can also call [phone] or the [place] [phone]). Remove your access to the firearms in the home, secure all sharps, and have someone else hold your medications and given to you when you need them.  For your medications, we discontinued the Wellbutrin today since it was not working.  We also started Abilify 2 mg once daily.  We will get a lipid panel, A1c, EKG in 3 months.  I will coordinate with your PCP to get a nutrition referral for you as well as check a vitamin D, B12, iron panel with ferritin.
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
## 2022-08-09 ENCOUNTER — Encounter: Payer: Self-pay | Admitting: Family Medicine

## 2022-08-11 ENCOUNTER — Ambulatory Visit: Payer: Medicaid Other | Admitting: Neurology

## 2022-08-12 ENCOUNTER — Encounter (HOSPITAL_COMMUNITY): Payer: Self-pay

## 2022-08-12 ENCOUNTER — Other Ambulatory Visit (HOSPITAL_COMMUNITY): Payer: Medicaid Other | Attending: Psychiatry | Admitting: Licensed Clinical Social Worker

## 2022-08-12 ENCOUNTER — Telehealth (HOSPITAL_COMMUNITY): Payer: Self-pay | Admitting: Licensed Clinical Social Worker

## 2022-08-12 DIAGNOSIS — F332 Major depressive disorder, recurrent severe without psychotic features: Secondary | ICD-10-CM

## 2022-08-12 DIAGNOSIS — F410 Panic disorder [episodic paroxysmal anxiety] without agoraphobia: Secondary | ICD-10-CM

## 2022-08-12 DIAGNOSIS — F4310 Post-traumatic stress disorder, unspecified: Secondary | ICD-10-CM
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
## 2022-08-14 ENCOUNTER — Telehealth (HOSPITAL_COMMUNITY): Payer: Self-pay | Admitting: Licensed Clinical Social Worker

## 2022-08-14 ENCOUNTER — Telehealth (INDEPENDENT_AMBULATORY_CARE_PROVIDER_SITE_OTHER): Payer: BC Managed Care – PPO | Admitting: Psychiatry

## 2022-08-14 DIAGNOSIS — F411 Generalized anxiety disorder: Secondary | ICD-10-CM

## 2022-08-14 DIAGNOSIS — F40240 Claustrophobia: Secondary | ICD-10-CM

## 2022-08-14 DIAGNOSIS — F603 Borderline personality disorder: Secondary | ICD-10-CM

## 2022-08-14 DIAGNOSIS — K5903 Drug induced constipation: Secondary | ICD-10-CM

## 2022-08-14 DIAGNOSIS — F4310 Post-traumatic stress disorder, unspecified: Secondary | ICD-10-CM

## 2022-08-14 DIAGNOSIS — K5900 Constipation, unspecified: Secondary | ICD-10-CM | POA: Insufficient documentation

## 2022-08-14 DIAGNOSIS — F5081 Binge eating disorder: Secondary | ICD-10-CM

## 2022-08-14 DIAGNOSIS — F332 Major depressive disorder, recurrent severe without psychotic features: Secondary | ICD-10-CM | POA: Diagnosis not present

## 2022-08-14 DIAGNOSIS — F410 Panic disorder [episodic paroxysmal anxiety] without agoraphobia: Secondary | ICD-10-CM

## 2022-08-14 HISTORY — DX: Constipation, unspecified: K59.00

## 2022-08-14 MED ORDER — POLYETHYLENE GLYCOL 3350 17 G PO PACK: 17.0000 g | PACK | Freq: Every day | ORAL | 1 refills | Status: DC
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
## 2022-08-14 NOTE — Patient Instructions (Addendum)
We added MiraLAX to your regimen today to help with the constipation from the Abilify.  Outside from this, try to keep identifying when you are using the good versus bad dilemma as well as should statements.  Remember to practice the deep breathing technique we went over today and that should help with the anxiety quite a bit.  Be sure to check in with your PCP for the blood work tests that we talked about.

## 2022-08-15 ENCOUNTER — Telehealth: Payer: Self-pay | Admitting: Urgent Care

## 2022-08-15 DIAGNOSIS — N760 Acute vaginitis: Secondary | ICD-10-CM

## 2022-08-16 MED ORDER — METRONIDAZOLE 500 MG PO TABS: 500.0000 mg | ORAL_TABLET | Freq: Two times a day (BID) | ORAL | 0 refills | Status: AC

## 2022-08-16 NOTE — Progress Notes (Signed)
E-Visit for Vaginal Symptoms  We are sorry that you are not feeling well. Here is how we plan to help! Based on what you shared with me it looks like you: May have a vaginosis due to bacteria  Vaginosis is an inflammation of the vagina that can result in discharge, itching and pain. The cause is usually a change in the normal balance of vaginal bacteria or an infection. Vaginosis can also result from reduced estrogen levels after menopause.  The most common causes of vaginosis are:   Bacterial vaginosis which results from an overgrowth of one on several organisms that are normally present in your vagina.   Yeast infections which are caused by a naturally occurring fungus called candida.   Vaginal atrophy (atrophic vaginosis) which results from the thinning of the vagina from reduced estrogen levels after menopause.   Trichomoniasis which is caused by a parasite and is commonly transmitted by sexual intercourse.  Factors that increase your risk of developing vaginosis include: Medications, such as antibiotics and steroids Uncontrolled diabetes Use of hygiene products such as bubble bath, vaginal spray or vaginal deodorant Douching Wearing damp or tight-fitting clothing Using an intrauterine device (IUD) for birth control Hormonal changes, such as those associated with pregnancy, birth control pills or menopause Sexual activity Having a sexually transmitted infection  Your treatment plan is Metronidazole or Flagyl 500mg  twice a day for 7 days.  I have electronically sent this prescription into the pharmacy that you have chosen. This medication is NOT compatible with breast feeding. You indicated on your form that you are not breast feeding. If this was done in error, please contact us so we may change your prescription. Do NOT drink any alcohol while taking the medication.  Be sure to take all of the medication as directed. Stop taking any medication if you develop a rash, tongue swelling  or shortness of breath. Mothers who are breast feeding should consider pumping and discarding their breast milk while on these antibiotics. However, there is no consensus that infant exposure at these doses would be harmful.  Remember that medication creams can weaken latex condoms. Marland Kitchen   HOME CARE:  Good hygiene may prevent some types of vaginosis from recurring and may relieve some symptoms:  Avoid baths, hot tubs and whirlpool spas. Rinse soap from your outer genital area after a shower, and dry the area well to prevent irritation. Don't use scented or harsh soaps, such as those with deodorant or antibacterial action. Avoid irritants. These include scented tampons and pads. Wipe from front to back after using the toilet. Doing so avoids spreading fecal bacteria to your vagina.  Other things that may help prevent vaginosis include:  Don't douche. Your vagina doesn't require cleansing other than normal bathing. Repetitive douching disrupts the normal organisms that reside in the vagina and can actually increase your risk of vaginal infection. Douching won't clear up a vaginal infection. Use a latex condom. Both female and female latex condoms may help you avoid infections spread by sexual contact. Wear cotton underwear. Also wear pantyhose with a cotton crotch. If you feel comfortable without it, skip wearing underwear to bed. Yeast thrives in [employer] Your symptoms should improve in the next day or two.  GET HELP RIGHT AWAY IF:  You have pain in your lower abdomen ( pelvic area or over your ovaries) You develop nausea or vomiting You develop a fever Your discharge changes or worsens You have persistent pain with intercourse You develop shortness of breath,  a rapid pulse, or you faint.  These symptoms could be signs of problems or infections that need to be evaluated by a medical provider now.  MAKE SURE YOU   Understand these instructions. Will watch your condition. Will  get help right away if you are not doing well or get worse.  Thank you for choosing an e-visit.  Your e-visit answers were reviewed by a board certified advanced clinical practitioner to complete your personal care plan. Depending upon the condition, your plan could have included both over the counter or prescription medications.  Please review your pharmacy choice. Make sure the pharmacy is open so you can pick up prescription now. If there is a problem, you may contact your provider through [employer] and have the prescription routed to another pharmacy.  Your safety is important to [place]. If you have drug allergies check your prescription carefully.   For the next 24 hours you can use MyChart to ask questions about today's visit, request a non-urgent call back, or ask for a work or school excuse. You will get an email in the next two days asking about your experience. I hope that your e-visit has been valuable and will speed your recovery.   I have spent 5 minutes in review of e-visit questionnaire, review and updating patient chart, medical decision making and response to patient.   Chunky, PA

## 2022-08-17 ENCOUNTER — Ambulatory Visit (HOSPITAL_COMMUNITY): Payer: BC Managed Care – PPO | Admitting: Licensed Clinical Social Worker

## 2022-08-17 ENCOUNTER — Encounter (HOSPITAL_COMMUNITY): Payer: Self-pay

## 2022-08-17 DIAGNOSIS — F332 Major depressive disorder, recurrent severe without psychotic features: Secondary | ICD-10-CM | POA: Diagnosis not present

## 2022-08-17 DIAGNOSIS — F40240 Claustrophobia: Secondary | ICD-10-CM

## 2022-08-17 DIAGNOSIS — F410 Panic disorder [episodic paroxysmal anxiety] without agoraphobia: Secondary | ICD-10-CM

## 2022-08-17 DIAGNOSIS — F4310 Post-traumatic stress disorder, unspecified: Secondary | ICD-10-CM

## 2022-08-17 DIAGNOSIS — F603 Borderline personality disorder: Secondary | ICD-10-CM
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
## 2022-08-18 ENCOUNTER — Other Ambulatory Visit: Payer: Self-pay | Admitting: Family Medicine

## 2022-08-18 ENCOUNTER — Ambulatory Visit (HOSPITAL_COMMUNITY): Payer: BC Managed Care – PPO | Admitting: Licensed Clinical Social Worker

## 2022-08-18 DIAGNOSIS — R55 Syncope and collapse: Secondary | ICD-10-CM

## 2022-08-18 DIAGNOSIS — R000 Tachycardia, unspecified: Secondary | ICD-10-CM

## 2022-08-18 DIAGNOSIS — F332 Major depressive disorder, recurrent severe without psychotic features: Secondary | ICD-10-CM

## 2022-08-18 DIAGNOSIS — F603 Borderline personality disorder: Secondary | ICD-10-CM

## 2022-08-18 DIAGNOSIS — F410 Panic disorder [episodic paroxysmal anxiety] without agoraphobia: Secondary | ICD-10-CM

## 2022-08-18 DIAGNOSIS — F4310 Post-traumatic stress disorder, unspecified: Secondary | ICD-10-CM

## 2022-08-18 NOTE — Progress Notes (Signed)
   08/18/22 [id number]  Patient-Centered Profile  PCP Completed On 08/18/22  Plan Meeting Date 08/18/22  Effective Date 08/18/22  What People Like and Admire About? "I guess my personality. I don't know."  What's Important to? "My family."  How Best to Support? "I guess just like you are now, help me." Pt will attend PHP Monday through Friday for 2-3 weeks.  Add What's Working / [employer] Not Working? "I think a routine has helped. Not getting out of the bed, that really hasn't helped."  PCP Action Plan  Long Range Outcome Pt will report increased ability to manage depression and anxiety symptoms using coping skills at least 3x a day. Pt will report decrease in passive SI to 0x a week. Pt will report increased ability to catch, challenge, change cognitive distortions at least 3x a day.   Pt agrees to tx plan.

## 2022-08-19 ENCOUNTER — Telehealth (HOSPITAL_COMMUNITY): Payer: Self-pay

## 2022-08-19 ENCOUNTER — Ambulatory Visit (HOSPITAL_COMMUNITY): Payer: BC Managed Care – PPO | Admitting: Licensed Clinical Social Worker

## 2022-08-19 DIAGNOSIS — F410 Panic disorder [episodic paroxysmal anxiety] without agoraphobia: Secondary | ICD-10-CM

## 2022-08-19 DIAGNOSIS — F4310 Post-traumatic stress disorder, unspecified: Secondary | ICD-10-CM

## 2022-08-19 DIAGNOSIS — F332 Major depressive disorder, recurrent severe without psychotic features: Secondary | ICD-10-CM | POA: Diagnosis not present

## 2022-08-19 DIAGNOSIS — F603 Borderline personality disorder: Secondary | ICD-10-CM

## 2022-08-19 NOTE — Telephone Encounter (Signed)
[facility] in [place] sent in fax for a refill on pt's ARIPiprazole (ABILIFY) 2 MG tablet  due to switching pharmacies. Rx [facility] 08/07/[facility]. Please advise.

## 2022-08-20 ENCOUNTER — Encounter (HOSPITAL_COMMUNITY): Payer: Self-pay

## 2022-08-20 ENCOUNTER — Ambulatory Visit (INDEPENDENT_AMBULATORY_CARE_PROVIDER_SITE_OTHER): Payer: BC Managed Care – PPO | Admitting: Licensed Clinical Social Worker

## 2022-08-20 DIAGNOSIS — F332 Major depressive disorder, recurrent severe without psychotic features: Secondary | ICD-10-CM | POA: Diagnosis not present

## 2022-08-20 DIAGNOSIS — F410 Panic disorder [episodic paroxysmal anxiety] without agoraphobia: Secondary | ICD-10-CM

## 2022-08-20 DIAGNOSIS — F4310 Post-traumatic stress disorder, unspecified: Secondary | ICD-10-CM

## 2022-08-20 DIAGNOSIS — F603 Borderline personality disorder: Secondary | ICD-10-CM

## 2022-08-20 MED ORDER — ARIPIPRAZOLE 5 MG PO TABS: 5.0000 mg | ORAL_TABLET | Freq: Every day | ORAL | 0 refills | Status: DC

## 2022-08-20 NOTE — Progress Notes (Signed)
BH MD/PA/NP OP Progress Note  Virtual Visit via Video Note  I connected with Marcia Gonzalez on 08/20/22 at  9:00 AM EST by a video enabled telemedicine application and verified that I am speaking with the correct person using two identifiers.  Location: Patient: Home Provider: [facility]   I discussed the limitations of evaluation and management by telemedicine and the availability of in person appointments. The patient expressed understanding and agreed to proceed.    08/20/2022 12:35 PM Marcia Gonzalez  MRN:  [id number]  Chief Complaint:  Chief Complaint  Patient presents with   Follow-up   Anxiety   HPI:  Marcia Gonzalez is a 27 yr old female who presents via Virtual Video Visit for Follow Up and Medication Management, she enrolled in the [facility] Program on 08/17/2022.  PPHx is significant for Depression, GAD w/Panic, Claustrophobia, PTSD, ADHD, and Borderline Personality Disorder, and 1 Suicide Attempt (OD- 06/2022) and a remote history of Self Injurious Behavior (biting- last ~10 yrs ago), and no history of Psychiatric Hospitalizations.    She reports that she has been feeling much more anxious to the point where she has been crying often.  She reports that she has felt for a few months like her Lexapro was not working.  She reports that this started during her pregnancy.  She reports that she will only get 2 to 3 hours at a time of uninterrupted sleep due to the baby waking up.  Asked if her husband has been trying to assist her in this and she states that when he gets home he will take their son so that she can go to sleep but often times she lays in bed and due to her anxiety will not be able to sleep.  When asked how she is done with the Abilify she does report that she thinks there has been some small improvement.  She reports that her husband has stated he has seen an improvement and that she is more like herself again.  She reports that she is under a lot of stress due to medical issues.   She reports that she had worn a heart monitor and was found to have heart rate spikes throughout it and so is told she is go see a cardiologist.  She also reports that her son continues to have acid reflux and so they are again changing the formula and it is expensive at the $50 a can.  She states that she is nervous to make a medication change case does not work but knows something does need to be changed.  Discussed that stopping the Lexapro to trial another antidepressant would have a higher risk of being unsuccessful versus increasing the Abilify which she has noticed an improvement in her symptoms since starting.  She was agreeable to increasing the Abilify at this time.  She reports that group has been helpful and she has been learning a lot of skills.  She reports no SI, HI, HI towards her son, or AVH.  She reports her sleep is poor.  She reports her appetite is poor.  She reports no other concerns at present.   Visit Diagnosis:    ICD-10-CM   1. Major depressive disorder, recurrent severe without psychotic features (HCC)  F33.2 ARIPiprazole (ABILIFY) 5 MG tablet    2. Generalized anxiety disorder with panic attacks  F41.1 ARIPiprazole (ABILIFY) 5 MG tablet   F41.0     3. Borderline personality disorder ([place])  F60.3     4. PTSD (  post-traumatic stress disorder)  F43.10 ARIPiprazole (ABILIFY) 5 MG tablet      Past Psychiatric History: Depression, GAD w/Panic, Claustrophobia, PTSD, ADHD, and Borderline Personality Disorder, and 1 Suicide Attempt (OD- 06/2022) and a remote history of Self Injurious Behavior (biting- last ~10 yrs ago), and no history of Psychiatric Hospitalizations.   Past Medical History:  Past Medical History:  Diagnosis Date   Allergy    Anemia    Anxiety    Arthritis    Asthma    Depression    Endometriosis    GERD (gastroesophageal reflux disease)    Hypertension    gestational   Hypothyroidism    IIH (idiopathic intracranial hypertension)    Pregnancy induced  hypertension    UTI (urinary tract infection)    Wells' syndrome     Past Surgical History:  Procedure Laterality Date   ABDOMINAL EXPLORATION SURGERY  04/2021   COLONOSCOPY     DILATION AND CURETTAGE OF UTERUS     FINGER SURGERY     LAPAROTOMY     LUMBAR PUNCTURE      Family Psychiatric History: Mother- Post Partum Depression Father- Pain pill abuse No Known Suicides.  Family History:  Family History  Problem Relation Age of Onset   Kidney disease Mother    Hypertension Mother    Hyperlipidemia Mother    Depression Mother    Cancer Mother        cervical   Arthritis Mother    Anxiety disorder Mother    Depression Father    Anxiety disorder Father    Asthma Brother    COPD Maternal Grandmother    Arthritis Maternal Grandmother     Social History:  Social History   Socioeconomic History   Marital status: Married    Spouse name: Marcia Gonzalez   Number of children: 0   Years of education: 12   Highest education level: High school graduate  Occupational History   Not on file  Tobacco Use   Smoking status: Never    Passive exposure: Never   Smokeless tobacco: Never  Vaping Use   Vaping Use: Never used  Substance and Sexual Activity   Alcohol use: Not Currently   Drug use: Never   Sexual activity: Yes    Birth control/protection: None  Other Topics Concern   Not on file  Social History Narrative   Not on file   Social Determinants of Health   Financial Resource Strain: Low Risk  (04/13/2022)   Overall Financial Resource Strain (CARDIA)    Difficulty of Paying Living Expenses: Not very hard  Food Insecurity: No Food Insecurity (04/13/2022)   Hunger Vital Sign    Worried About Running Out of Food in the Last Year: Never true    Ran Out of Food in the Last Year: Never true  Transportation Needs: No Transportation Needs (04/13/2022)   PRAPARE - Administrator, Civil Service (Medical): No    Lack of Transportation (Non-Medical): No  Physical  Activity: Insufficiently Active (04/13/2022)   Exercise Vital Sign    Days of Exercise per Week: 1 day    Minutes of Exercise per Session: 10 min  Stress: No Stress Concern Present (04/13/2022)   [employer] of Occupational Health - Occupational Stress Questionnaire    Feeling of Stress : Only a little  Social Connections: Socially Integrated (04/13/2022)   Social Connection and Isolation Panel [NHANES]    Frequency of Communication with Friends and Family: More than three times a  week    Frequency of Social Gatherings with Friends and Family: Once a week    Attends Religious Services: More than 4 times per year    Active Member of [employer] or Organizations: Yes    Attends Music therapist: More than 4 times per year    Marital Status: Married    Allergies:  Allergies  Allergen Reactions   Codeine Nausea Only   Amoxicillin-Pot Clavulanate Other (See Comments)    Hallucinations   Misoprostol     Left blisters in mouth   Oxycodone Hives and Itching    Tylenol 3   Pollen Extract Other (See Comments)   Other Rash    Latex tape, causes reaction and blisters.    Metabolic Disorder Labs: No results found for: "HGBA1C", "MPG" No results found for: "PROLACTIN" Lab Results  Component Value Date   CHOL 220 (H) 06/30/2021   TRIG 160 (H) 06/30/2021   HDL 44 06/30/2021   CHOLHDL 5.0 (H) 06/30/2021   LDLCALC 147 (H) 06/30/2021   Lab Results  Component Value Date   TSH 0.665 07/29/2022   TSH 0.236 (L) 07/10/2022    Therapeutic Level Labs: No results found for: "LITHIUM" No results found for: "VALPROATE" No results found for: "CBMZ"  Current Medications: Current Outpatient Medications  Medication Sig Dispense Refill   acetaminophen (TYLENOL) 325 MG tablet Take 2 tablets (650 mg total) by mouth every 4 (four) hours.     ARIPiprazole (ABILIFY) 5 MG tablet Take 1 tablet (5 mg total) by mouth daily. 30 tablet 0   escitalopram (LEXAPRO) 20 MG tablet Take 1 tablet (20  mg total) by mouth daily. 90 tablet 3   ferrous sulfate 325 (65 FE) MG tablet Take 325 mg by mouth daily with breakfast.     ibuprofen (ADVIL) 600 MG tablet Take 1 tablet (600 mg total) by mouth every 6 (six) hours. 30 tablet 1   metroNIDAZOLE (FLAGYL) 500 MG tablet Take 1 tablet (500 mg total) by mouth 2 (two) times daily for 7 days. 14 tablet 0   pantoprazole (PROTONIX) 40 MG tablet Take 1 tablet (40 mg total) by mouth daily as needed. 90 tablet 3   polyethylene glycol (MIRALAX / GLYCOLAX) 17 g packet Take 17 g by mouth daily. 30 each 1   Prenatal Vit-Fe Fumarate-FA (PRENATAL MULTIVITAMIN) TABS tablet Take 1 tablet by mouth daily at 12 noon.     SYNTHROID 88 MCG tablet Take 1 tablet (88 mcg total) by mouth daily before breakfast. 90 tablet 3   No current facility-administered medications for this visit.     Musculoskeletal: Strength & Muscle Tone: within normal limits Gait & Station:  sitting during interview Patient leans: N/A  Psychiatric Specialty Exam: Review of Systems  Respiratory:  Negative for shortness of breath.   Cardiovascular:  Negative for chest pain.  Gastrointestinal:  Negative for abdominal pain, constipation, diarrhea, nausea and vomiting.  Neurological:  Negative for dizziness, weakness and headaches.  Psychiatric/Behavioral:  Positive for dysphoric mood and sleep disturbance. Negative for hallucinations and suicidal ideas. The patient is nervous/anxious.     not currently breastfeeding.There is no height or weight on file to calculate BMI.  General Appearance: Casual and Fairly Groomed  Eye Contact:  Good  Speech:  Clear and Coherent and Normal Rate  Volume:  Normal  Mood:  Anxious  Affect:  Congruent  Thought Process:  Coherent and Goal Directed  Orientation:  Full (Time, Place, and Person)  Thought Content: WDL and Logical  Suicidal Thoughts:  No  Homicidal Thoughts:  No  Memory:  Immediate;   Good Recent;   Good  Judgement:  Good  Insight:  Good   Psychomotor Activity:  Normal  Concentration:  Concentration: Good and Attention Span: Good  Recall:  Good  Fund of Knowledge: Good  Language: Good  Akathisia:  Negative  Handed:  Right  AIMS (if indicated): not done  Assets:  Communication Skills Desire for Improvement Financial Resources/Insurance Housing Intimacy Physical Health Resilience Social Support  ADL's:  Intact  Cognition: WNL  Sleep:  Poor   Screenings: GAD-7    Flowsheet Row Office Visit from 05/18/2022 in [place] Office Visit from 05/04/2022 in [place] Office Visit from 04/22/2022 in [place] Routine Prenatal from 01/28/2022 in [facility] for [place] at [facility] Initial Prenatal from 10/21/2021 in [facility] for [place] at [facility]  Total GAD-7 Score 20 6 4 12 8       PHQ2-9    Flowsheet Row Counselor from 08/12/2022 in [place] Video Visit from 08/07/2022 in [place] at [place] from 05/18/2022 in [place] Office Visit from 05/04/2022 in [place] Office Visit from 04/22/2022 in [place]  PHQ-2 Total Score 6 6 4 1  0  PHQ-9 Total Score 25 22 20 8 7       Flowsheet Row Counselor from 08/12/2022 in [place] Video Visit from 08/07/2022 in [place] at [place] Admission (Discharged) from 04/03/2022 in [place] 4S Mother Baby Unit  C-SSRS RISK CATEGORY Error: Q3, 4, or 5 should not be populated when Q2 is No High Risk No Risk        Assessment and Plan:  Allye Hoyos is a 27 yr old female who presents via Virtual Video Visit for Follow Up and Medication Management, she enrolled in the [facility]  on 08/17/2022.  PPHx is significant for Depression, GAD w/Panic, Claustrophobia, PTSD, ADHD, and Borderline Personality Disorder, and 1 Suicide Attempt (OD- 06/2022) and a remote history of Self Injurious Behavior (biting- last ~10 yrs ago), and no history of Psychiatric Hospitalizations.    Dalissa is reporting continued issues with significant anxiety.  Since there has been an improvement in symptoms since starting the Abilify we will trial an increase in the Abilify at this time.  We will not make any other changes to her medication at this time.  We will continue to monitor.   MDD, Recurrent, Severe, w/out Psychosis  GAD w/ Panic Disorder  PTSD: -Continue Lexapro 20 mg daily.  No refills sent at this time. -Increase Abilify to 5 mg daily.  30 tablets with no refills.    Collaboration of Care: Collaboration of Care: Other PHP Program  Patient/Guardian was advised Release of Information must be obtained prior to any record release in order to collaborate their care with an outside provider. Patient/Guardian was advised if they have not already done so to contact the registration department to sign all necessary forms in order for [place] to release information regarding their care.   Consent: Patient/Guardian gives verbal consent for treatment and assignment of benefits for services provided during this visit. Patient/Guardian expressed understanding and agreed to proceed.    Briant Cedar, MD 08/20/2022, 12:35 PM   Follow Up Instructions:  I discussed the assessment and treatment plan with the patient. The patient was provided an opportunity to ask questions and all were answered. The patient agreed with the plan and demonstrated an understanding of the instructions.   The patient was advised to call back or seek an in-person evaluation if the symptoms worsen or if the condition fails to improve as anticipated.  I provided 15 minutes of non-face-to-face time during this  encounter.   Lauro Franklin, MD

## 2022-08-20 NOTE — Psych (Signed)
Virtual Visit via Video Note  I connected with Marcia Gonzalez on 08/17/22 at  9:00 AM EST by a video enabled telemedicine application and verified that I am speaking with the correct person using two identifiers.  Location: Patient: patient home Provider: clinical home office   I discussed the limitations of evaluation and management by telemedicine and the availability of in person appointments. The patient expressed understanding and agreed to proceed.  I discussed the assessment and treatment plan with the patient. The patient was provided an opportunity to ask questions and all were answered. The patient agreed with the plan and demonstrated an understanding of the instructions.   The patient was advised to call back or seek an in-person evaluation if the symptoms worsen or if the condition fails to improve as anticipated.  Pt was provided 240 minutes of non-face-to-face time during this encounter.   Lorin Glass, LCSW   [facility] PHP THERAPIST PROGRESS NOTE  Marcia Gonzalez [id number]  Session Time: 9:00 - 10:00   Participation Level: Active  Behavioral Response: CasualAlertDepressed  Type of Therapy: Group Therapy  Treatment Goals addressed: Coping  Progress Towards Goals: Initial  Interventions: CBT, DBT, Supportive, and Reframing  Summary: Marcia Gonzalez is a 27 y.o. female who presents with depression and mood symptoms.  Clinician led check-in regarding current stressors and situation, and review of patient completed daily inventory. Clinician utilized active listening and empathetic response and validated patient emotions. Clinician facilitated processing group on pertinent issues.?    Therapist Response: Patient arrived within time allowed. Patient rates her mood at a 6 on a scale of 1-10 with 10 being best. Pt states she feels "midway." Pt states she slept "off and on" and ate 2x. Pt reports struggling with sleep due to getting up every 3 hours with her 40mo for  feedings, and is struggling with variable appetite eating a lot or not at all. Pt states she has been struggling with getting out of bed and doing chores- anything not related to caring for her son. Pt identifies hopeless feelings.  Patient able to process. Patient engaged in discussion.             Session Time: 10:00 am - 11:00 am   Participation Level: Active   Behavioral Response: CasualAlertDepressed   Type of Therapy: Group Therapy   Treatment Goals addressed: Coping   Progress Towards Goals: Progressing   Interventions: CBT, DBT, Solution Focused, Strength-based, Supportive, and Reframing   Therapist Response: Cln led processing group for pt's current struggles. Group members shared stressors and provided support and feedback. Cln brought in topics of boundaries, healthy relationships, and unhealthy thought processes to inform discussion.    Therapist Response: Pt able to process and provide support to group.          Session Time: 11:00 -12:00   Participation Level: Active   Behavioral Response: CasualAlertDepressed   Type of Therapy: Group Therapy   Treatment Goals addressed: Coping   Progress Towards Goals: Progressing   Interventions: CBT, DBT, Solution Focused, Strength-based, Supportive, and Reframing   Summary: Cln introduced topic of DBT distress tolerance skills. Cln provided context for distress tolerance skills and how to practice them. Cln introduced the ACCEPTS distraction skills and group discusses ways to utilize "A" activities.    Therapist Response: Pt engaged in discussion and is able to state ways to apply this skill.          Session Time: 12:00 -1:00   Participation Level: Active   Behavioral  Response: CasualAlertDepressed   Type of Therapy: Group therapy, Occupational Therapy   Treatment Goals addressed: Coping   Progress Towards Goals: Progressing   Interventions: Supportive; Psychoeducation   Summary: 12:00 - 12:50:  Occupational Therapy group led by cln E. Hollan. 12:50 - 1:00 Clinician assessed for immediate needs, medication compliance and efficacy, and safety concerns.   Therapist Response: 12:00 - 12:50: Pt participated 12:50 - 1:00 pm: At check-out, patient reports no immediate concerns. Patient demonstrates progress as evidenced by participation in first group session. Patient denies SI/HI/self-harm thoughts at the end of group.     Suicidal/Homicidal: Nowithout intent/plan  Plan: Pt will continue in PHP while working to decrease depression and mood symptoms, increase ADLs, and increase ability to manage symptoms in a healthy manner.   Collaboration of Care: Medication Management AEB A Pash[place]  Patient/Guardian was advised Release of Information must be obtained prior to any record release in order to collaborate their care with an outside provider. Patient/Guardian was advised if they have not already done so to contact the registration department to sign all necessary forms in order for [place] to release information regarding their care.   Consent: Patient/Guardian gives verbal consent for treatment and assignment of benefits for services provided during this visit. Patient/Guardian expressed understanding and agreed to proceed.   Diagnosis: Major depressive disorder, recurrent severe without psychotic features (Marcia Gonzalez) [F33.2]    1. Major depressive disorder, recurrent severe without psychotic features (Marcia Gonzalez)   2. Generalized anxiety disorder with panic attacks   3. Borderline personality disorder (Marcia Gonzalez)   4. PTSD (post-traumatic stress disorder)   5. Claustrophobia       Lorin Glass, LCSW 08/20/2022

## 2022-08-21 ENCOUNTER — Ambulatory Visit (HOSPITAL_COMMUNITY): Payer: BC Managed Care – PPO | Admitting: Licensed Clinical Social Worker

## 2022-08-21 ENCOUNTER — Encounter (HOSPITAL_COMMUNITY): Payer: Self-pay

## 2022-08-21 DIAGNOSIS — F603 Borderline personality disorder: Secondary | ICD-10-CM

## 2022-08-21 DIAGNOSIS — F410 Panic disorder [episodic paroxysmal anxiety] without agoraphobia: Secondary | ICD-10-CM

## 2022-08-21 DIAGNOSIS — F332 Major depressive disorder, recurrent severe without psychotic features: Secondary | ICD-10-CM | POA: Diagnosis not present

## 2022-08-21 NOTE — Progress Notes (Unsigned)
Spoke with patient via WebEx video call, used 2 identifiers to correctly identify patient. She was referred by her psychiatrist for depression/anxiety. 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 Then she was able to have a child who is now 22 months old but her depression has become much worse. Has no support system other than her husband. Sleeping all day and isolating from friends. Was given Abilify and started it this morning but it has made her very sleepy. She is going to start taking it at night. On scale 1-10 as 10 being worst she rates depression at 4 and anxiety at 4. Denies SI/HI or AV hallucinations. PHQ9=21.No other issues or complaints.

## 2022-08-21 NOTE — Psych (Signed)
Virtual Visit via Video Note  I connected with Marcia Gonzalez on 08/19/22 at  9:00 AM EST by a video enabled telemedicine application and verified that I am speaking with the correct person using two identifiers.  Location: Patient: patient home Provider: clinical home office   I discussed the limitations of evaluation and management by telemedicine and the availability of in person appointments. The patient expressed understanding and agreed to proceed.  I discussed the assessment and treatment plan with the patient. The patient was provided an opportunity to ask questions and all were answered. The patient agreed with the plan and demonstrated an understanding of the instructions.   The patient was advised to call back or seek an in-person evaluation if the symptoms worsen or if the condition fails to improve as anticipated.  Pt was provided 240 minutes of non-face-to-face time during this encounter.   Lorin Glass, LCSW   [facility] PHP THERAPIST PROGRESS NOTE  Marcia Gonzalez [id number]  Session Time: 9:00 - 10:00   Participation Level: Active  Behavioral Response: CasualAlertDepressed  Type of Therapy: Group Therapy  Treatment Goals addressed: Coping  Progress Towards Goals: Initial  Interventions: CBT, DBT, Supportive, and Reframing  Summary: Marcia Gonzalez is a 27 y.o. female who presents with depression and mood symptoms.  Clinician led check-in regarding current stressors and situation, and review of patient completed daily inventory. Clinician utilized active listening and empathetic response and validated patient emotions. Clinician facilitated processing group on pertinent issues.?    Therapist Response: Patient arrived within time allowed. Patient rates her mood at a 3.5 on a scale of 1-10 with 10 being best. Pt states she feels "really anxious." Pt states she slept "not really" and ate 1x. Pt reports her son is struggling due to a formula change and she was up all  night with him. Pt states feeling overwhelmed. Pt reports having panic attacks. Patient able to process. Patient engaged in discussion.             Session Time: 10:00 am - 11:00 am   Participation Level: Active   Behavioral Response: CasualAlertDepressed   Type of Therapy: Group Therapy   Treatment Goals addressed: Coping   Progress Towards Goals: Progressing   Interventions: CBT, DBT, Solution Focused, Strength-based, Supportive, and Reframing   Therapist Response: Cln led processing group for pt's current struggles. Group members shared stressors and provided support and feedback. Cln brought in topics of boundaries, healthy relationships, and unhealthy thought processes to inform discussion.    Therapist Response:  Pt able to process and provide support to group.          Session Time: 11:00 -12:00   Participation Level: Active   Behavioral Response: CasualAlertDepressed   Type of Therapy: Group Therapy, Spiritual Care   Treatment Goals addressed: Coping   Progress Towards Goals: Progressing   Interventions: Supportive, Education   Summary:  Marcia Gonzalez, Chaplain, led group.   Therapist Response: Pt participated           Session Time: 12:00 -1:00   Participation Level: Active   Behavioral Response: CasualAlertDepressed   Type of Therapy: Group therapy, Occupational Therapy   Treatment Goals addressed: Coping   Progress Towards Goals: Progressing   Interventions: Supportive; Psychoeducation   Summary: 12:00 - 12:50: Occupational Therapy group led by cln E. Hollan. 12:50 - 1:00 Clinician assessed for immediate needs, medication compliance and efficacy, and safety concerns.   Therapist Response: 12:00 - 12:50: Pt participated 12:50 - 1:00 pm: At check-out,  patient reports no immediate concerns. Patient demonstrates progress as evidenced by continued participation and responsiveness to treatment. Patient denies SI/HI/self-harm thoughts at the end of  group.     Suicidal/Homicidal: Nowithout intent/plan  Plan: Pt will continue in PHP while working to decrease depression and mood symptoms, increase ADLs, and increase ability to manage symptoms in a healthy manner.   Collaboration of Care: Medication Management AEB A Pashayan  Patient/Guardian was advised Release of Information must be obtained prior to any record release in order to collaborate their care with an outside provider. Patient/Guardian was advised if they have not already done so to contact the registration department to sign all necessary forms in order for [place] to release information regarding their care.   Consent: Patient/Guardian gives verbal consent for treatment and assignment of benefits for services provided during this visit. Patient/Guardian expressed understanding and agreed to proceed.   Diagnosis: Major depressive disorder, recurrent severe without psychotic features ([place]) [F33.2]    1. Major depressive disorder, recurrent severe without psychotic features ([place])   2. Generalized anxiety disorder with panic attacks   3. Borderline personality disorder ([place])   4. PTSD (post-traumatic stress disorder)       Lorin Glass, LCSW 08/21/2022

## 2022-08-21 NOTE — Psych (Signed)
Virtual Visit via Video Note  I connected with Marcia Gonzalez on 08/20/22 at  9:00 AM EST by a video enabled telemedicine application and verified that I am speaking with the correct person using two identifiers.  Location: Patient: patient home Provider: clinical home office   I discussed the limitations of evaluation and management by telemedicine and the availability of in person appointments. The patient expressed understanding and agreed to proceed.  I discussed the assessment and treatment plan with the patient. The patient was provided an opportunity to ask questions and all were answered. The patient agreed with the plan and demonstrated an understanding of the instructions.   The patient was advised to call back or seek an in-person evaluation if the symptoms worsen or if the condition fails to improve as anticipated.  Pt was provided 240 minutes of non-face-to-face time during this encounter.   Lorin Glass, LCSW   [facility] PHP THERAPIST PROGRESS NOTE  Marcia Gonzalez [id number]  Session Time: 9:00 - 10:00   Participation Level: Active  Behavioral Response: CasualAlertDepressed  Type of Therapy: Group Therapy  Treatment Goals addressed: Coping  Progress Towards Goals: Initial  Interventions: CBT, DBT, Supportive, and Reframing  Summary: Marcia Gonzalez is a 27 y.o. female who presents with depression and mood symptoms.  Clinician led check-in regarding current stressors and situation, and review of patient completed daily inventory. Clinician utilized active listening and empathetic response and validated patient emotions. Clinician facilitated processing group on pertinent issues.?    Therapist Response: Patient arrived within time allowed. Patient rates her mood at a 5 on a scale of 1-10 with 10 being best. Pt states she feels "really anxious." Pt states she slept poorly and ate 1x. Pt reports her son was difficult yesterday and pt struggled with feeling  overwhelmed. Pt identifies panic attacks throughout the day. Pt shares her mom was unsupportive about pt's treatment and it increased pt's hopelessness. Patient able to process. Patient engaged in discussion.             Session Time: 10:00 am - 11:00 am   Participation Level: Active   Behavioral Response: CasualAlertDepressed   Type of Therapy: Group Therapy   Treatment Goals addressed: Coping   Progress Towards Goals: Progressing   Interventions: CBT, DBT, Solution Focused, Strength-based, Supportive, and Reframing   Therapist Response: Cln led discussion on trust and how to establish healthy trust. Group discussed common missteps such as disclosing personal information too soon, ignoring behaviors being displayed, inaccurately defining the relationship, and not giving people credit. Cln utilized CBT thought challenging principles to encourage pt's to rely on "evidence" versus feelings. Group members shared struggles they experience with trust.    Therapist Response: Pt engaged in discussion and reports increased insight.            Session Time: 11:00 -12:00   Participation Level: Active   Behavioral Response: CasualAlertDepressed   Type of Therapy: Group Therapy   Treatment Goals addressed: Coping   Progress Towards Goals: Progressing   Interventions: CBT, DBT, Solution Focused, Strength-based, Supportive, and Reframing   Summary: Cln led discussion on how/when to share sensitive topics with others. Cln discussed the connection between trust and appropriate sharing and how to determine what is appropriate to share.      Therapist Response: Pt engaged in discussion           Session Time: 12:00 -1:00   Participation Level: Active   Behavioral Response: CasualAlertDepressed   Type of Therapy: Group therapy,  Occupational Therapy   Treatment Goals addressed: Coping   Progress Towards Goals: Progressing   Interventions: Supportive; Psychoeducation    Summary: 12:00 - 12:50: Occupational Therapy group led by cln E. Hollan. 12:50 - 1:00 Clinician assessed for immediate needs, medication compliance and efficacy, and safety concerns.   Therapist Response: 12:00 - 12:50: Pt participated 12:50 - 1:00 pm: At check-out, patient reports no immediate concerns. Patient demonstrates progress as evidenced by continued participation and responsiveness to treatment. Patient denies SI/HI/self-harm thoughts at the end of group.     Suicidal/Homicidal: Nowithout intent/plan  Plan: Pt will continue in PHP while working to decrease depression and mood symptoms, increase ADLs, and increase ability to manage symptoms in a healthy manner.   Collaboration of Care: Medication Management AEB A Pashayan  Patient/Guardian was advised Release of Information must be obtained prior to any record release in order to collaborate their care with an outside provider. Patient/Guardian was advised if they have not already done so to contact the registration department to sign all necessary forms in order for [place] to release information regarding their care.   Consent: Patient/Guardian gives verbal consent for treatment and assignment of benefits for services provided during this visit. Patient/Guardian expressed understanding and agreed to proceed.   Diagnosis: Major depressive disorder, recurrent severe without psychotic features ([place]) [F33.2]    1. Major depressive disorder, recurrent severe without psychotic features ([place])   2. Generalized anxiety disorder with panic attacks   3. Borderline personality disorder ([place])   4. PTSD (post-traumatic stress disorder)       Lorin Glass, LCSW 08/21/2022

## 2022-08-21 NOTE — Psych (Signed)
Virtual Visit via Video Note  I connected with [name] on 08/18/22 at  9:00 AM EST by a video enabled telemedicine application and verified that I am speaking with the correct person using two identifiers.  Location: Patient: patient home Provider: clinical home office   I discussed the limitations of evaluation and management by telemedicine and the availability of in person appointments. The patient expressed understanding and agreed to proceed.  I discussed the assessment and treatment plan with the patient. The patient was provided an opportunity to ask questions and all were answered. The patient agreed with the plan and demonstrated an understanding of the instructions.   The patient was advised to call back or seek an in-person evaluation if the symptoms worsen or if the condition fails to improve as anticipated.  Pt was provided 240 minutes of non-face-to-face time during this encounter.   Marcia Glass, LCSW   [facility] PHP THERAPIST PROGRESS NOTE  Marcia Gonzalez [id number]  Session Time: 9:00 - 10:00   Participation Level: Active  Behavioral Response: CasualAlertDepressed  Type of Therapy: Group Therapy  Treatment Goals addressed: Coping  Progress Towards Goals: Initial  Interventions: CBT, DBT, Supportive, and Reframing  Summary: Marcia Gonzalez is a 27 y.o. female who presents with depression and mood symptoms.  Clinician led check-in regarding current stressors and situation, and review of patient completed daily inventory. Clinician utilized active listening and empathetic response and validated patient emotions. Clinician facilitated processing group on pertinent issues.?    Therapist Response: Patient arrived within time allowed. Patient rates her mood at a 7 on a scale of 1-10 with 10 being best. Pt states she feels "some better." Pt states she slept "off and on" and ate 1x. Pt reports she pushed herself to get out of bed and she cooked and did a load of  laundry. Pt states each task took "so much longer" than normal. Pt struggles with mood stability. Patient able to process. Patient engaged in discussion.             Session Time: 10:00 am - 11:00 am   Participation Level: Active   Behavioral Response: CasualAlertDepressed   Type of Therapy: Group Therapy   Treatment Goals addressed: Coping   Progress Towards Goals: Progressing   Interventions: CBT, DBT, Solution Focused, Strength-based, Supportive, and Reframing   Therapist Response: Cln led discussion on coping with loneliness. Group members shared common reactions to loneliness and how it helps/hinders them. Cln contextualized loneliness and encouraged pt's to manage loneliness as they would any other feeling. Group brainstormed ways to manage.    Therapist Response: Pt engaged in discussion and reports increased insight.            Session Time: 11:00 -12:00   Participation Level: Active   Behavioral Response: CasualAlertDepressed   Type of Therapy: Group Therapy   Treatment Goals addressed: Coping   Progress Towards Goals: Progressing   Interventions: CBT, DBT, Solution Focused, Strength-based, Supportive, and Reframing   Summary: Cln continued topic of DBT distress tolerance skills and the ACCEPTS distraction skill. Group reviewed C-C skills and discussed how they can practice them in their every day life.    Therapist Response: Pt engaged in discussion and is able to state ways to apply this skill.          Session Time: 12:00 -1:00   Participation Level: Active   Behavioral Response: CasualAlertDepressed   Type of Therapy: Group therapy, Occupational Therapy   Treatment Goals addressed: Coping  Progress Towards Goals: Progressing   Interventions: Supportive; Psychoeducation   Summary: 12:00 - 12:50: Occupational Therapy group led by cln E. Hollan. 12:50 - 1:00 Clinician assessed for immediate needs, medication compliance and efficacy, and  safety concerns.   Therapist Response: 12:00 - 12:50: Pt participated 12:50 - 1:00 pm: At check-out, patient reports no immediate concerns. Patient demonstrates progress as evidenced by continued participation and responsiveness to treatment. Patient denies SI/HI/self-harm thoughts at the end of group.     Suicidal/Homicidal: Nowithout intent/plan  Plan: Pt will continue in PHP while working to decrease depression and mood symptoms, increase ADLs, and increase ability to manage symptoms in a healthy manner.   Collaboration of Care: Medication Management AEB A Pashayan  Patient/Guardian was advised Release of Information must be obtained prior to any record release in order to collaborate their care with an outside provider. Patient/Guardian was advised if they have not already done so to contact the registration department to sign all necessary forms in order for [place] to release information regarding their care.   Consent: Patient/Guardian gives verbal consent for treatment and assignment of benefits for services provided during this visit. Patient/Guardian expressed understanding and agreed to proceed.   Diagnosis: Major depressive disorder, recurrent severe without psychotic features ([place]) [F33.2]    1. Major depressive disorder, recurrent severe without psychotic features ([place])   2. Generalized anxiety disorder with panic attacks   3. Borderline personality disorder ([place])   4. PTSD (post-traumatic stress disorder)       Marcia Glass, LCSW 08/21/2022

## 2022-08-24 ENCOUNTER — Ambulatory Visit (INDEPENDENT_AMBULATORY_CARE_PROVIDER_SITE_OTHER): Payer: BC Managed Care – PPO | Admitting: Licensed Clinical Social Worker

## 2022-08-24 DIAGNOSIS — F332 Major depressive disorder, recurrent severe without psychotic features: Secondary | ICD-10-CM | POA: Diagnosis not present

## 2022-08-24 DIAGNOSIS — F410 Panic disorder [episodic paroxysmal anxiety] without agoraphobia: Secondary | ICD-10-CM

## 2022-08-24 NOTE — Progress Notes (Signed)
Spoke with patient via WebEx video call, used 2 identifiers to correctly identify patient. States that groups are going great and she is glad she is in them. Feels they are helping a lot. Still really tired even after switching Abilify to night time dosing. Also feels more restless with more anxiety. Feels her panic attacks have increased. Has not wanted to mention or talk about a side effect she has been having but felt like it needs to be addressed. She has been having sexual side effects since starting Lexapro. Decreased sexual desire and dryness issues. Message sent to MD for discussion. On scale 1-10 as 10 being worst she rates depression at 6 and anxiety at 8. Denies SI/HI or AV hallucinations. No other issues or complaints.

## 2022-08-24 NOTE — Psych (Signed)
Virtual Visit via Video Note  I connected with Marcia Gonzalez on 08/21/22 at  9:00 AM EST by a video enabled telemedicine application and verified that I am speaking with the correct person using two identifiers.  Location: Patient: patient home Provider: clinical home office   I discussed the limitations of evaluation and management by telemedicine and the availability of in person appointments. The patient expressed understanding and agreed to proceed.  I discussed the assessment and treatment plan with the patient. The patient was provided an opportunity to ask questions and all were answered. The patient agreed with the plan and demonstrated an understanding of the instructions.   The patient was advised to call back or seek an in-person evaluation if the symptoms worsen or if the condition fails to improve as anticipated.  Pt was provided 240 minutes of non-face-to-face time during this encounter.   Lorin Glass, LCSW   [facility] PHP THERAPIST PROGRESS NOTE  Marcia Gonzalez [id number]  Session Time: 9:00 - 10:00   Participation Level: Active  Behavioral Response: CasualAlertDepressed  Type of Therapy: Group Therapy  Treatment Goals addressed: Coping  Progress Towards Goals: Progressing  Interventions: CBT, DBT, Supportive, and Reframing  Summary: Marcia Gonzalez is a 27 y.o. female who presents with depression and mood symptoms.  Clinician led check-in regarding current stressors and situation, and review of patient completed daily inventory. Clinician utilized active listening and empathetic response and validated patient emotions. Clinician facilitated processing group on pertinent issues.?    Therapist Response: Patient arrived within time allowed. Patient rates her mood at a 6 on a scale of 1-10 with 10 being best. Pt states she feels "bad about myself." Pt states she slept 3 hours and ate 2x. Pt reports being upset by a phone call with her mom in which her mom said pt  was behaving like a bad mom for not holding her child 24/7. Pt received support from group to counter that message. Pt struggles to set boundaries with mom despite knowing mom is a trigger. Pt states feeling hopeless after mom's call. Patient able to process. Patient engaged in discussion.             Session Time: 10:00 am - 11:00 am   Participation Level: Active   Behavioral Response: CasualAlertDepressed   Type of Therapy: Group Therapy   Treatment Goals addressed: Coping   Progress Towards Goals: Progressing   Interventions: CBT, DBT, Solution Focused, Strength-based, Supportive, and Reframing   Therapist Response: Cln led discussion on ways to manage stressors and feelings over the weekend. Group members  brainstormed things to do over the weekend for multiple levels of energy, access, and moods. Cln reviewed crisis services should they be needed and provided pt's with the text crisis line, mobile crisis, national suicide hotline, [facility] 24/7 line, and information on [facility].      Therapist Response: Pt engaged in discussion and is able to identify 3 ideas of what to do over the weekend to keep their mind engaged.              Session Time: 11:00 -12:00   Participation Level: Active   Behavioral Response: CasualAlertDepressed   Type of Therapy: Group Therapy   Treatment Goals addressed: Coping   Progress Towards Goals: Progressing   Interventions: CBT, DBT, Solution Focused, Strength-based, Supportive, and Reframing   Summary: Cln continued topic of DBT distress tolerance skills and the ACCEPTS distraction skill. Group reviewed E-P skills and discussed how they can practice  them in their every day life.    Therapist Response: Pt engaged in discussion and reports understanding of skill and how to apply it.          Session Time: 12:00 -1:00   Participation Level: Active   Behavioral Response: CasualAlertDepressed   Type of Therapy: Group therapy,  Occupational Therapy   Treatment Goals addressed: Coping   Progress Towards Goals: Progressing   Interventions: Supportive; Psychoeducation   Summary: 12:00 - 12:50: Occupational Therapy group led by cln E. Hollan. 12:50 - 1:00 Clinician assessed for immediate needs, medication compliance and efficacy, and safety concerns.   Therapist Response: 12:00 - 12:50: Pt participated 12:50 - 1:00 pm: At check-out, patient reports no immediate concerns. Patient demonstrates progress as evidenced by continued participation and responsiveness to treatment. Patient denies SI/HI/self-harm thoughts at the end of group.     Suicidal/Homicidal: Nowithout intent/plan  Plan: Pt will continue in PHP while working to decrease depression and mood symptoms, increase ADLs, and increase ability to manage symptoms in a healthy manner.   Collaboration of Care: Medication Management AEB A Pashayan  Patient/Guardian was advised Release of Information must be obtained prior to any record release in order to collaborate their care with an outside provider. Patient/Guardian was advised if they have not already done so to contact the registration department to sign all necessary forms in order for [place] to release information regarding their care.   Consent: Patient/Guardian gives verbal consent for treatment and assignment of benefits for services provided during this visit. Patient/Guardian expressed understanding and agreed to proceed.   Diagnosis: Major depressive disorder, recurrent severe without psychotic features ([place]) [F33.2]    1. Major depressive disorder, recurrent severe without psychotic features ([place])   2. Generalized anxiety disorder with panic attacks   3. Borderline personality disorder ([place])       Lorin Glass, LCSW 08/24/2022

## 2022-08-25 ENCOUNTER — Ambulatory Visit (INDEPENDENT_AMBULATORY_CARE_PROVIDER_SITE_OTHER): Payer: BC Managed Care – PPO

## 2022-08-25 ENCOUNTER — Encounter (HOSPITAL_COMMUNITY): Payer: Self-pay

## 2022-08-25 DIAGNOSIS — F410 Panic disorder [episodic paroxysmal anxiety] without agoraphobia: Secondary | ICD-10-CM

## 2022-08-25 DIAGNOSIS — F332 Major depressive disorder, recurrent severe without psychotic features: Secondary | ICD-10-CM | POA: Diagnosis not present

## 2022-08-25 DIAGNOSIS — F4310 Post-traumatic stress disorder, unspecified: Secondary | ICD-10-CM

## 2022-08-25 DIAGNOSIS — F603 Borderline personality disorder: Secondary | ICD-10-CM

## 2022-08-25 MED ORDER — MIRTAZAPINE 7.5 MG PO TABS: 7.5000 mg | ORAL_TABLET | Freq: Every day | ORAL | 0 refills | Status: DC

## 2022-08-25 NOTE — Progress Notes (Signed)
BH MD/PA/NP PHP Progress Note  Virtual Visit via Video Note  I connected with Marcia Gonzalez on 08/25/22 at  9:00 AM EST by a video enabled telemedicine application and verified that I am speaking with the correct person using two identifiers.  Location: Patient: Home Provider: [facility]   I discussed the limitations of evaluation and management by telemedicine and the availability of in person appointments. The patient expressed understanding and agreed to proceed.  08/25/2022 11:53 AM Marcia Gonzalez  MRN:  [id number]  Chief Complaint:  Chief Complaint  Patient presents with   Follow-up   Anxiety   Depression   Medication Problem   HPI:  Marcia Gonzalez is a 27 yr old female who presents via Virtual Video Visit for Follow Up and Medication Management, she enrolled in the [facility] Program on 08/17/2022.  PPHx is significant for Depression, GAD w/Panic, Claustrophobia, PTSD, ADHD, and Borderline Personality Disorder, and 1 Suicide Attempt (OD- 06/2022) and a remote history of Self Injurious Behavior (biting- last ~10 yrs ago), and no history of Psychiatric Hospitalizations.     She reports that things are so-so today.  She reports that she just feels like the Lexapro is not working.  She reports that she has been having intrusive thoughts.  She reports that they was involve around her son getting hurt such as hand and falling off of the couch or other things happening to him but is quick to confirm they are not thoughts of her doing anything to hurt him.  She reports that she is also having significant sexual side effects with the Lexapro.  She reports that her husband also had sexual side effects to SSRIs.  She reports that she is had significantly decreased sex drive and issues achieving orgasm.  She reports that since starting the Lexapro approximately 6 years ago she is only had 1 orgasm, she reports that this included both intercourse and masturbation.  Discussed with her that often when this side  effect is present we would trial Wellbutrin but she had already tried it in the past and reports that it was not effective in controlling her symptoms.  Discussed trialing Remeron and discussed potential risks and side effects and she was agreeable to it.  Discussed decreasing her Lexapro to 10 mg for 4 days and then stopping the Lexapro and starting the Remeron on the third day of 10 mg of Lexapro.  She reported understanding and had no concerns.  She reports no SI, HI, HI against her son, or AVH.  She reports her sleep is poor.  She reports her appetite is good.  She reports no other concerns at present.   Visit Diagnosis:    ICD-10-CM   1. Major depressive disorder, recurrent severe without psychotic features (HCC)  F33.2 mirtazapine (REMERON) 7.5 MG tablet    2. Generalized anxiety disorder with panic attacks  F41.1 mirtazapine (REMERON) 7.5 MG tablet   F41.0     3. Borderline personality disorder (HCC)  F60.3     4. PTSD (post-traumatic stress disorder)  F43.10 mirtazapine (REMERON) 7.5 MG tablet      Past Psychiatric History: Depression, GAD w/Panic, Claustrophobia, PTSD, ADHD, and Borderline Personality Disorder, and 1 Suicide Attempt (OD- 06/2022) and a remote history of Self Injurious Behavior (biting- last ~10 yrs ago), and no history of Psychiatric Hospitalizations.    Past Medical History:  Past Medical History:  Diagnosis Date   Allergy    Anemia    Anxiety    Arthritis  Asthma    Depression    Endometriosis    GERD (gastroesophageal reflux disease)    Hypertension    gestational   Hypothyroidism    IIH (idiopathic intracranial hypertension)    Pregnancy induced hypertension    UTI (urinary tract infection)    Wells' syndrome     Past Surgical History:  Procedure Laterality Date   ABDOMINAL EXPLORATION SURGERY  04/2021   COLONOSCOPY     DILATION AND CURETTAGE OF UTERUS     FINGER SURGERY     LAPAROTOMY     LUMBAR PUNCTURE      Family Psychiatric History:  Mother- Post Partum Depression Father- Pain pill abuse No Known Suicides.  Family History:  Family History  Problem Relation Age of Onset   Kidney disease Mother    Hypertension Mother    Hyperlipidemia Mother    Depression Mother    Cancer Mother        cervical   Arthritis Mother    Anxiety disorder Mother    Depression Father    Anxiety disorder Father    Asthma Brother    COPD Maternal Grandmother    Arthritis Maternal Grandmother     Social History:  Social History   Socioeconomic History   Marital status: Married    Spouse name: Marcia Gonzalez   Number of children: 1   Years of education: 12   Highest education level: High school graduate  Occupational History   Not on file  Tobacco Use   Smoking status: Never    Passive exposure: Never   Smokeless tobacco: Never  Vaping Use   Vaping Use: Never used  Substance and Sexual Activity   Alcohol use: Not Currently   Drug use: Never   Sexual activity: Yes    Birth control/protection: None  Other Topics Concern   Not on file  Social History Narrative   Not on file   Social Determinants of Health   Financial Resource Strain: Low Risk  (04/13/2022)   Overall Financial Resource Strain (CARDIA)    Difficulty of Paying Living Expenses: Not very hard  Food Insecurity: No Food Insecurity (04/13/2022)   Hunger Vital Sign    Worried About Running Out of Food in the Last Year: Never true    Ran Out of Food in the Last Year: Never true  Transportation Needs: No Transportation Needs (04/13/2022)   PRAPARE - Hydrologist (Medical): No    Lack of Transportation (Non-Medical): No  Physical Activity: Insufficiently Active (04/13/2022)   Exercise Vital Sign    Days of Exercise per Week: 1 day    Minutes of Exercise per Session: 10 min  Stress: No Stress Concern Present (04/13/2022)   [place]    Feeling of Stress : Only a little   Social Connections: Socially Integrated (04/13/2022)   Social Connection and Isolation Panel [NHANES]    Frequency of Communication with Friends and Family: More than three times a week    Frequency of Social Gatherings with Friends and Family: Once a week    Attends Religious Services: More than 4 times per year    Active Member of [employer] or Organizations: Yes    Attends Music therapist: More than 4 times per year    Marital Status: Married    Allergies:  Allergies  Allergen Reactions   Codeine Nausea Only   Amoxicillin-Pot Clavulanate Other (See Comments)    Hallucinations  Misoprostol     Left blisters in mouth   Oxycodone Hives and Itching    Tylenol 3   Pollen Extract Other (See Comments)   Other Rash    Latex tape, causes reaction and blisters.    Metabolic Disorder Labs: No results found for: "HGBA1C", "MPG" No results found for: "PROLACTIN" Lab Results  Component Value Date   CHOL 220 (H) 06/30/2021   TRIG 160 (H) 06/30/2021   HDL 44 06/30/2021   CHOLHDL 5.0 (H) 06/30/2021   LDLCALC 147 (H) 06/30/2021   Lab Results  Component Value Date   TSH 0.665 07/29/2022   TSH 0.236 (L) 07/10/2022    Therapeutic Level Labs: No results found for: "LITHIUM" No results found for: "VALPROATE" No results found for: "CBMZ"  Current Medications: Current Outpatient Medications  Medication Sig Dispense Refill   mirtazapine (REMERON) 7.5 MG tablet Take 1 tablet (7.5 mg total) by mouth at bedtime. 30 tablet 0   acetaminophen (TYLENOL) 325 MG tablet Take 2 tablets (650 mg total) by mouth every 4 (four) hours.     ARIPiprazole (ABILIFY) 5 MG tablet Take 1 tablet (5 mg total) by mouth daily. 30 tablet 0   escitalopram (LEXAPRO) 20 MG tablet Take 1 tablet (20 mg total) by mouth daily. 90 tablet 3   ferrous sulfate 325 (65 FE) MG tablet Take 325 mg by mouth daily with breakfast.     ibuprofen (ADVIL) 600 MG tablet Take 1 tablet (600 mg total) by mouth every 6 (six)  hours. 30 tablet 1   pantoprazole (PROTONIX) 40 MG tablet Take 1 tablet (40 mg total) by mouth daily as needed. 90 tablet 3   polyethylene glycol (MIRALAX / GLYCOLAX) 17 g packet Take 17 g by mouth daily. 30 each 1   Prenatal Vit-Fe Fumarate-FA (PRENATAL MULTIVITAMIN) TABS tablet Take 1 tablet by mouth daily at 12 noon.     SYNTHROID 88 MCG tablet Take 1 tablet (88 mcg total) by mouth daily before breakfast. 90 tablet 3   No current facility-administered medications for this visit.     Musculoskeletal: Strength & Muscle Tone: within normal limits Gait & Station:  Sitting During Interview Patient leans: N/A  Psychiatric Specialty Exam: Review of Systems  Respiratory:  Negative for shortness of breath.   Cardiovascular:  Negative for chest pain.  Gastrointestinal:  Negative for abdominal pain, constipation, diarrhea, nausea and vomiting.  Neurological:  Negative for dizziness, weakness and headaches.  Psychiatric/Behavioral:  Positive for dysphoric mood and sleep disturbance. Negative for hallucinations and suicidal ideas. The patient is nervous/anxious.     not currently breastfeeding.There is no height or weight on file to calculate BMI.  General Appearance: Casual and Fairly Groomed  Eye Contact:  Fair  Speech:  Clear and Coherent and Normal Rate  Volume:  Normal  Mood:  Anxious  Affect:  Congruent  Thought Process:  Coherent and Goal Directed  Orientation:  Full (Time, Place, and Person)  Thought Content: WDL and Logical   Suicidal Thoughts:  No  Homicidal Thoughts:  No  Memory:  Immediate;   Good Recent;   Good  Judgement:  Fair  Insight:  Fair  Psychomotor Activity:  Normal  Concentration:  Concentration: Good and Attention Span: Good  Recall:  Good  Fund of Knowledge: Good  Language: Good  Akathisia:  Negative  Handed:  Right  AIMS (if indicated): not done  Assets:  Communication Skills Desire for Improvement Housing Resilience  ADL's:  Intact  Cognition: WNL  Sleep:  Poor fragmented   Screenings: GAD-7    Flowsheet Row Office Visit from 05/18/2022 in [place] Office Visit from 05/04/2022 in [facility] Office Visit from 04/22/2022 in [facility] Routine Prenatal from 01/28/2022 in [facility] for [place] at [facility] Initial Prenatal from 10/21/2021 in [facility] for [place] at [facility]  Total GAD-7 Score 20 6 4 12 8       PHQ2-9    Flowsheet Row Counselor from 08/21/2022 in [facility] Counselor from 08/12/2022 in [place] Video Visit from 08/07/2022 in [place] [facility] [place] Visit from 05/18/2022 in [place] Office Visit from 05/04/2022 in [facility]  PHQ-2 Total Score 6 6 6 4 1   PHQ-9 Total Score 21 25 22 20 8       Flowsheet Row Counselor from 08/21/2022 in [facility] Counselor from 08/12/2022 in [place] Video Visit from 08/07/2022 in [place] [facility] Error: Q3, 4, or 5 should not be populated when Q2 is No Error: Q3, 4, or 5 should not be populated when Q2 is No High Risk        Assessment and Plan:  Marcia Gonzalez is a 27 yr old female who presents via Virtual Video Visit for Follow Up and Medication Management, she enrolled in the [facility] Program on 08/17/2022.  PPHx is significant for Depression, GAD w/Panic, Claustrophobia, PTSD, ADHD, and Borderline Personality Disorder, and 1 Suicide Attempt (OD- 06/2022) and a remote history of Self Injurious Behavior (biting- last ~10 yrs ago), and no history of Psychiatric Hospitalizations.     Marcia Gonzalez has been having significant side effects to Lexapro  and reports that it is not controlling her depression or anxiety.  We will begin a taper off Lexapro and onto Remeron.  She will decrease her Lexapro to 10 mg for 4 days and start the Remeron on the third day.  We will not make any other changes to her medication at this time.  We will continue monitor.   MDD, Recurrent, Severe, w/out Psychosis  GAD w/ Panic Disorder  PTSD: -Decrease Lexapro to 10 mg for 4 days then stop. -Start Remeron 7.5 mg QHS for depression and anxiety.  30 tablets with 0 refills. -Continue Abilify 5 mg daily.  No refills sent at this time.    Collaboration of Care: Collaboration of Care: Other PHP Program  Patient/Guardian was advised Release of Information must be obtained prior to any record release in order to collaborate their care with an outside provider. Patient/Guardian was advised if they have not already done so to contact the registration department to sign all necessary forms in order for [place] to release information regarding their care.   Consent: Patient/Guardian gives verbal consent for treatment and assignment of benefits for services provided during this visit. Patient/Guardian expressed understanding and agreed to proceed.    Briant Cedar, MD 08/25/2022, 11:53 AM  Follow Up Instructions:    I discussed the assessment and treatment plan with the patient. The patient was provided an opportunity to ask questions and all were answered. The patient agreed with the plan and demonstrated an understanding of the instructions.   The patient was advised to call back or seek an in-person evaluation if the  symptoms worsen or if the condition fails to improve as anticipated.  I provided 20 minutes of non-face-to-face time during this encounter.   Briant Cedar, MD

## 2022-08-25 NOTE — Psych (Signed)
Virtual Visit via Video Note  I connected with Marcia Gonzalez on 08/24/22 at  9:00 AM EST by a video enabled telemedicine application and verified that I am speaking with the correct person using two identifiers.  Location: Patient: patient home Provider: clinical home office   I discussed the limitations of evaluation and management by telemedicine and the availability of in person appointments. The patient expressed understanding and agreed to proceed.  I discussed the assessment and treatment plan with the patient. The patient was provided an opportunity to ask questions and all were answered. The patient agreed with the plan and demonstrated an understanding of the instructions.   The patient was advised to call back or seek an in-person evaluation if the symptoms worsen or if the condition fails to improve as anticipated.  Pt was provided 240 minutes of non-face-to-face time during this encounter.   Lorin Glass, LCSW   [facility] PHP THERAPIST PROGRESS NOTE  Marcia Gonzalez [id number]  Session Time: 9:00 - 10:00   Participation Level: Active  Behavioral Response: CasualAlertDepressed  Type of Therapy: Group Therapy  Treatment Goals addressed: Coping  Progress Towards Goals: Progressing  Interventions: CBT, DBT, Supportive, and Reframing  Summary: Marcia Gonzalez is a 27 y.o. female who presents with depression and mood symptoms.  Clinician led check-in regarding current stressors and situation, and review of patient completed daily inventory. Clinician utilized active listening and empathetic response and validated patient emotions. Clinician facilitated processing group on pertinent issues.?    Therapist Response: Patient arrived within time allowed. Patient rates her mood at a 6 on a scale of 1-10 with 10 being best. Pt states she feels "bad about myself." Pt states she slept 3 hours and ate snacks. Pt reports feeling increased hopelessness over the weekend, triggered by  a visit from her mom. Pt reports her mom provides social interaction and help with her child which on top of trauma issues, makes it difficult for pt to set limits. Pt shares that she also struggled to sit still and had "weird energy" on Sunday. Patient able to process. Patient engaged in discussion.             Session Time: 10:00 am - 11:00 am   Participation Level: Active   Behavioral Response: CasualAlertDepressed   Type of Therapy: Group Therapy   Treatment Goals addressed: Coping   Progress Towards Goals: Progressing   Interventions: CBT, DBT, Solution Focused, Strength-based, Supportive, and Reframing   Therapist Response: Cln led processing group for pt's current struggles. Group members shared stressors and provided support and feedback. Cln brought in topics of boundaries, healthy relationships, and unhealthy thought processes to inform discussion.    Therapist Response: Pt able to process and provide support to group.              Session Time: 11:00 -12:00   Participation Level: Active   Behavioral Response: CasualAlertDepressed   Type of Therapy: Group Therapy   Treatment Goals addressed: Coping   Progress Towards Goals: Progressing   Interventions: CBT, DBT, Solution Focused, Strength-based, Supportive, and Reframing   Summary: Cln continued topic of DBT distress tolerance skills and the ACCEPTS distraction skill. Group reviewed T-S  skills and discussed how they can practice them in their every day life.    Therapist Response: Pt engaged in discussion and reports understanding of skill and how to apply it.          Session Time: 12:00 -1:00   Participation Level: Active   Behavioral  Response: CasualAlertDepressed   Type of Therapy: Group therapy, Occupational Therapy   Treatment Goals addressed: Coping   Progress Towards Goals: Progressing   Interventions: Supportive; Psychoeducation   Summary: 12:00 - 12:50: Occupational Therapy group  led by cln E. Hollan. 12:50 - 1:00 Clinician assessed for immediate needs, medication compliance and efficacy, and safety concerns.   Therapist Response: 12:00 - 12:50: Pt participated 12:50 - 1:00 pm: At check-out, patient reports no immediate concerns. Patient demonstrates progress as evidenced by continued participation and responsiveness to treatment. Patient denies SI/HI/self-harm thoughts at the end of group.     Suicidal/Homicidal: Nowithout intent/plan  Plan: Pt will continue in PHP while working to decrease depression and mood symptoms, increase ADLs, and increase ability to manage symptoms in a healthy manner.   Collaboration of Care: Medication Management A[facility] A [place]  Patient/Guardian was advised Release of Information must be obtained prior to any record release in order to collaborate their care with an outside provider. Patient/Guardian was advised if they have not already done so to contact the registration department to sign all necessary forms in order for [place] to release information regarding their care.   Consent: Patient/Guardian gives verbal consent for treatment and assignment of benefits for services provided during this visit. Patient/Guardian expressed understanding and agreed to proceed.   Diagnosis: Major depressive disorder, recurrent severe without psychotic features ([place]) [F33.2]    1. Major depressive disorder, recurrent severe without psychotic features ([place])   2. Generalized anxiety disorder with panic attacks       Lorin Glass, LCSW 08/25/2022

## 2022-08-26 ENCOUNTER — Ambulatory Visit (INDEPENDENT_AMBULATORY_CARE_PROVIDER_SITE_OTHER): Payer: BC Managed Care – PPO | Admitting: Licensed Clinical Social Worker

## 2022-08-26 DIAGNOSIS — F332 Major depressive disorder, recurrent severe without psychotic features: Secondary | ICD-10-CM | POA: Diagnosis not present

## 2022-08-26 DIAGNOSIS — F410 Panic disorder [episodic paroxysmal anxiety] without agoraphobia: Secondary | ICD-10-CM

## 2022-08-26 NOTE — Psych (Signed)
Virtual Visit via Video Note  I connected with [name] on 08/25/22 at  9:00 AM EST by a video enabled telemedicine application and verified that I am speaking with the correct person using two identifiers.  Location: Patient: patient home Provider: clinical home office   I discussed the limitations of evaluation and management by telemedicine and the availability of in person appointments. The patient expressed understanding and agreed to proceed.  I discussed the assessment and treatment plan with the patient. The patient was provided an opportunity to ask questions and all were answered. The patient agreed with the plan and demonstrated an understanding of the instructions.   The patient was advised to call back or seek an in-person evaluation if the symptoms worsen or if the condition fails to improve as anticipated.  Pt was provided 240 minutes of non-face-to-face time during this encounter.   Marcia Glass, LCSW   [facility] PHP THERAPIST PROGRESS NOTE  Marcia Gonzalez [id number]  Session Time: 9:00 - 10:00   Participation Level: Active  Behavioral Response: CasualAlertDepressed  Type of Therapy: Group Therapy  Treatment Goals addressed: Coping  Progress Towards Goals: Progressing  Interventions: CBT, DBT, Supportive, and Reframing  Summary: Marcia Gonzalez is a 27 y.o. female who presents with depression and mood symptoms.  Clinician led check-in regarding current stressors and situation, and review of patient completed daily inventory. Clinician utilized active listening and empathetic response and validated patient emotions. Clinician facilitated processing group on pertinent issues.?    Therapist Response: Patient arrived within time allowed. Patient rates her mood at a 5 on a scale of 1-10 with 10 being best. Pt states she feels "having a hard time" Pt states she slept 5 hours and ate 3x. Pt reports she is feeling "alone" and that she has "no impact." Pt states  her social outlets have been limited and having no one to talk to or do other than take care of her son has impacted her self-worth. Pt discloses she had a problem with OxyContin after her miscarriage and states she will not take pain medication now because of that history. Pt receives support from group. Patient able to process. Patient engaged in discussion.             Session Time: 10:00 am - 11:00 am   Participation Level: Active   Behavioral Response: CasualAlertDepressed   Type of Therapy: Group Therapy   Treatment Goals addressed: Coping   Progress Towards Goals: Progressing   Interventions: CBT, DBT, Solution Focused, Strength-based, Supportive, and Reframing   Therapist Response: Cln led discussion on ways to manage our feelings. Cln encouraged pt's to rank their feeling in intensity 1-10 with 10 being most intense. If feeling is ranked 5+, pt's main job is to manage the feeling via coping skill. If the feeling is ranked 4 or below, coping or problem solving can be attempted. Cln brought in CBT thought challenging and DBT distress tolerance skills to aid discussion. Group members discussed how to apply and utilize this information.    Therapist Response: Pt engaged in discussion and reports understanding.              Session Time: 11:00 -12:00   Participation Level: Active   Behavioral Response: CasualAlertDepressed   Type of Therapy: Group Therapy   Treatment Goals addressed: Coping   Progress Towards Goals: Progressing   Interventions: CBT, DBT, Solution Focused, Strength-based, Supportive, and Reframing   Summary: Cln continued topic of DBT distress tolerance skills. Cln introduced Self-Soothe  skills. Group discussed ways they can utilize the five senses to soothe themselves when struggling.    Therapist Response: Pt engaged in discussion and identifies ways to utilize the skill.        Session Time: 12:00 -1:00   Participation Level: Active    Behavioral Response: CasualAlertDepressed   Type of Therapy: Group therapy, Occupational Therapy   Treatment Goals addressed: Coping   Progress Towards Goals: Progressing   Interventions: Supportive; Psychoeducation   Summary: 12:00 - 12:50: Occupational Therapy group led by cln E. Hollan. 12:50 - 1:00 Clinician assessed for immediate needs, medication compliance and efficacy, and safety concerns.   Therapist Response: 12:00 - 12:50: Pt participated 12:50 - 1:00 pm: At check-out, patient reports no immediate concerns. Patient demonstrates progress as evidenced by continued participation and responsiveness to treatment. Patient denies SI/HI/self-harm thoughts at the end of group.     Suicidal/Homicidal: Nowithout intent/plan  Plan: Pt will continue in PHP while working to decrease depression and mood symptoms, increase ADLs, and increase ability to manage symptoms in a healthy manner.   Collaboration of Care: Medication Management AEB A Pashayan  Patient/Guardian was advised Release of Information must be obtained prior to any record release in order to collaborate their care with an outside provider. Patient/Guardian was advised if they have not already done so to contact the registration department to sign all necessary forms in order for [place] to release information regarding their care.   Consent: Patient/Guardian gives verbal consent for treatment and assignment of benefits for services provided during this visit. Patient/Guardian expressed understanding and agreed to proceed.   Diagnosis: Major depressive disorder, recurrent severe without psychotic features ([place]) [F33.2]    1. Major depressive disorder, recurrent severe without psychotic features ([place])   2. Generalized anxiety disorder with panic attacks   3. Borderline personality disorder ([place])   4. PTSD (post-traumatic stress disorder)       Marcia Glass, LCSW 08/26/2022

## 2022-08-27 ENCOUNTER — Ambulatory Visit (HOSPITAL_COMMUNITY): Payer: Medicaid Other | Admitting: Licensed Clinical Social Worker

## 2022-08-27 DIAGNOSIS — F332 Major depressive disorder, recurrent severe without psychotic features: Secondary | ICD-10-CM

## 2022-08-27 DIAGNOSIS — F410 Panic disorder [episodic paroxysmal anxiety] without agoraphobia: Secondary | ICD-10-CM

## 2022-08-28 ENCOUNTER — Ambulatory Visit (INDEPENDENT_AMBULATORY_CARE_PROVIDER_SITE_OTHER): Payer: BC Managed Care – PPO | Admitting: Licensed Clinical Social Worker

## 2022-08-28 DIAGNOSIS — F410 Panic disorder [episodic paroxysmal anxiety] without agoraphobia: Secondary | ICD-10-CM

## 2022-08-28 DIAGNOSIS — F332 Major depressive disorder, recurrent severe without psychotic features: Secondary | ICD-10-CM

## 2022-08-30 NOTE — Psych (Signed)
Virtual Visit via Video Note  I connected with Marcia Gonzalez on 08/28/22 at  9:00 AM EST by a video enabled telemedicine application and verified that I am speaking with the correct person using two identifiers.  Location: Patient: patient home Provider: clinical home office   I discussed the limitations of evaluation and management by telemedicine and the availability of in person appointments. The patient expressed understanding and agreed to proceed.  I discussed the assessment and treatment plan with the patient. The patient was provided an opportunity to ask questions and all were answered. The patient agreed with the plan and demonstrated an understanding of the instructions.   The patient was advised to call back or seek an in-person evaluation if the symptoms worsen or if the condition fails to improve as anticipated.  Pt was provided 240 minutes of non-face-to-face time during this encounter.   Lorin Glass, LCSW   [facility] PHP THERAPIST PROGRESS NOTE  Marcia Gonzalez [id number]  Session Time: 9:00 - 10:00   Participation Level: Active  Behavioral Response: CasualAlertDepressed  Type of Therapy: Group Therapy  Treatment Goals addressed: Coping  Progress Towards Goals: Progressing  Interventions: CBT, DBT, Supportive, and Reframing  Summary: Marcia Gonzalez is a 27 y.o. female who presents with depression and mood symptoms.  Clinician led check-in regarding current stressors and situation, and review of patient completed daily inventory. Clinician utilized active listening and empathetic response and validated patient emotions. Clinician facilitated processing group on pertinent issues.?    Therapist Response: Patient arrived within time allowed. Patient rates her mood at a 5 on a scale of 1-10 with 10 being best. Pt states she feels "pretty good." Pt states she slept 2 hours and ate 2x. Pt reports she was able to complete a few chores yesterday which made her feel  productive. Pt reports she hung up on her mom when she was upsetting pt on the phone and wouldn't listen to pt's request to stop. Pt reports feeling proud of herself for giving herself what she needed in the moment. Pt reports she began a new medication yesterday and not notes yet.  Pt denies SI/HI.  Patient able to process. Patient engaged in discussion.             Session Time: 10:00 am - 11:00 am   Participation Level: Active   Behavioral Response: CasualAlertDepressed   Type of Therapy: Group Therapy   Treatment Goals addressed: Coping   Progress Towards Goals: Progressing   Interventions: CBT, DBT, Solution Focused, Strength-based, Supportive, and Reframing   Therapist Response: Cln led discussion on family dynamics and the way in which they impact [place]. Group members shared struggles with their families and the way the patterns of behaviors have negatively impacted them. Cln provided space to process and validated pt's experiences.    Therapist Response: Pt engaged in discussion and is able to process family difficulties with group.            Session Time: 11:00 -12:00   Participation Level: Active   Behavioral Response: CasualAlertDepressed   Type of Therapy: Group Therapy   Treatment Goals addressed: Coping   Progress Towards Goals: Progressing   Interventions: CBT, DBT, Solution Focused, Strength-based, Supportive, and Reframing   Summary: Cln introduced topic of CBT cognitive distortions. Cln discussed unhealthy thought patterns and how our thoughts shape our reality and irrational thoughts can alter our perspective. Cln utilized handout "cognitive distortions" to discuss common examples of distorted thoughts and group members worked to identify  examples in their own life.    Therapist Response:  Pt engaged in discussion and is able to determine examples of distorted thinking in their own life.            Session Time: 12:00 -1:00   Participation Level:  Active   Behavioral Response: CasualAlertDepressed   Type of Therapy: Group therapy, Occupational Therapy   Treatment Goals addressed: Coping   Progress Towards Goals: Progressing   Interventions: Supportive; Psychoeducation   Summary: 12:00 - 12:50: Occupational Therapy group led by cln E. Hollan. 12:50 - 1:00 Clinician assessed for immediate needs, medication compliance and efficacy, and safety concerns.   Therapist Response: 12:00 - 12:50: Pt participated 12:50 - 1:00 pm: At check-out, patient reports no immediate concerns. Patient demonstrates progress as evidenced by continued participation and responsiveness to treatment. Patient denies SI/HI/self-harm thoughts at the end of group.     Suicidal/Homicidal: Nowithout intent/plan  Plan: Pt will continue in PHP while working to decrease depression and mood symptoms, increase ADLs, and increase ability to manage symptoms in a healthy manner.   Collaboration of Care: Medication Management AEB A Pashayan  Patient/Guardian was advised Release of Information must be obtained prior to any record release in order to collaborate their care with an outside provider. Patient/Guardian was advised if they have not already done so to contact the registration department to sign all necessary forms in order for [place] to release information regarding their care.   Consent: Patient/Guardian gives verbal consent for treatment and assignment of benefits for services provided during this visit. Patient/Guardian expressed understanding and agreed to proceed.   Diagnosis: Major depressive disorder, recurrent severe without psychotic features ([place]) [F33.2]    1. Major depressive disorder, recurrent severe without psychotic features ([place])   2. Generalized anxiety disorder with panic attacks       Lorin Glass, LCSW 08/30/2022

## 2022-08-30 NOTE — Psych (Signed)
Pt did not attend PHP due to conflicting appointment.
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
## 2022-08-31 ENCOUNTER — Ambulatory Visit (INDEPENDENT_AMBULATORY_CARE_PROVIDER_SITE_OTHER): Payer: BC Managed Care – PPO | Admitting: Professional

## 2022-08-31 ENCOUNTER — Encounter (HOSPITAL_COMMUNITY): Payer: Self-pay

## 2022-08-31 ENCOUNTER — Encounter: Payer: Self-pay | Admitting: Family Medicine

## 2022-08-31 ENCOUNTER — Ambulatory Visit (INDEPENDENT_AMBULATORY_CARE_PROVIDER_SITE_OTHER): Payer: BC Managed Care – PPO | Admitting: Family Medicine

## 2022-08-31 VITALS — BP 134/88 | HR 94 | Temp 98.8°F | Ht 65.0 in | Wt 242.0 lb

## 2022-08-31 DIAGNOSIS — F332 Major depressive disorder, recurrent severe without psychotic features: Secondary | ICD-10-CM

## 2022-08-31 DIAGNOSIS — R569 Unspecified convulsions: Secondary | ICD-10-CM | POA: Diagnosis not present

## 2022-08-31 DIAGNOSIS — E063 Autoimmune thyroiditis: Secondary | ICD-10-CM | POA: Diagnosis not present

## 2022-08-31 DIAGNOSIS — F4310 Post-traumatic stress disorder, unspecified: Secondary | ICD-10-CM

## 2022-08-31 DIAGNOSIS — F603 Borderline personality disorder: Secondary | ICD-10-CM

## 2022-08-31 DIAGNOSIS — J069 Acute upper respiratory infection, unspecified: Secondary | ICD-10-CM

## 2022-08-31 DIAGNOSIS — F410 Panic disorder [episodic paroxysmal anxiety] without agoraphobia: Secondary | ICD-10-CM

## 2022-08-31 MED ORDER — CEFDINIR 300 MG PO CAPS: 300.0000 mg | ORAL_CAPSULE | Freq: Two times a day (BID) | ORAL | 0 refills | Status: DC

## 2022-08-31 NOTE — Psych (Signed)
Virtual Visit via Video Note  I connected with [name] on 08/31/22 at  9:00 AM EST by a video enabled telemedicine application and verified that I am speaking with the correct person using two identifiers.  Location: Patient: patient home Provider: clinical home office   I discussed the limitations of evaluation and management by telemedicine and the availability of in person appointments. The patient expressed understanding and agreed to proceed.  I discussed the assessment and treatment plan with the patient. The patient was provided an opportunity to ask questions and all were answered. The patient agreed with the plan and demonstrated an understanding of the instructions.   The patient was advised to call back or seek an in-person evaluation if the symptoms worsen or if the condition fails to improve as anticipated.  Pt was provided 240 minutes of non-face-to-face time during this encounter.   Royetta Crochet, [facility]   [facility] PHP THERAPIST PROGRESS NOTE  Azayliah Isabelle [id number]  Session Time: 9:00 - 10:00   Participation Level: Active  Behavioral Response: CasualAlertDepressed  Type of Therapy: Group Therapy  Treatment Goals addressed: Coping  Progress Towards Goals: Progressing  Interventions: CBT, DBT, Supportive, and Reframing  Summary: Meiyah Koran is a 27 y.o. female who presents with depression and mood symptoms.  Clinician led check-in regarding current stressors and situation, and review of patient completed daily inventory. Clinician utilized active listening and empathetic response and validated patient emotions. Clinician facilitated processing group on pertinent issues.?    Therapist Response: Patient arrived within time allowed. Patient rates her mood at a 5 on a scale of 1-10 with 10 being best. Pt states she feels "pretty good." Pt states sleep was increased and believes it is a side effect of the medication and appetite is OK. Pt reports she  spent time with her 45mo did dSand [place]pain[place], and spent time with her mom. Pt reports the interaction with her mom was better than normal and reports "she (mom) was kind." Pt reports she is feeling better since starting the new meds and PHP. 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 Pt denies SI/HI.  Patient able to process. Patient engaged in discussion.             Session Time: 10:00 am - 11:00 am   Participation Level: Active   Behavioral Response: CasualAlertDepressed   Type of Therapy: Group Therapy   Treatment Goals addressed: Coping   Progress Towards Goals: Progressing   Interventions: CBT, DBT, Solution Focused, Strength-based, Supportive, and Reframing   Therapist Response: Clinician introduced topic of "Positive Psychology". Group watched "Positive Psychology" Ted-Talk. Patients discussed how their "lens" of life affects the way they feel.    Therapist Response: Pt engaged in discussion and is able to relate to moving the goal post of success, which moves happiness over the cognitive horizon.         Session Time: 11:00 -12:00   Participation Level: Active   Behavioral Response: CasualAlertDepressed   Type of Therapy: Group Therapy   Treatment Goals addressed: Coping   Progress Towards Goals: Progressing   Interventions: CBT, DBT, Solution Focused, Strength-based, Supportive, and Reframing   Summary: Cln continued topic of positive psychology. Group discussed 5 strategies to help change lens. Patients identified one strategy they would be willing to try to change their "lens" for at least 21 days to create a new habit.   Therapist Response:  Pt engaged in  discussion and is able to identify writing 3 gratitudes daily to help change her lens.            Session Time: 12:00 -1:00   Participation Level: Active   Behavioral Response: CasualAlertDepressed   Type of Therapy: Group  therapy, Occupational Therapy   Treatment Goals addressed: Coping   Progress Towards Goals: Progressing   Interventions: Supportive; Psychoeducation   Summary: 12:00 - 12:50: Occupational Therapy group led by cln E. Hollan. 12:50 - 1:00 Clinician assessed for immediate needs, medication compliance and efficacy, and safety concerns.   Therapist Response: 12:00 - 12:50: Pt participated 12:50 - 1:00 pm: At check-out, patient reports no immediate concerns. Patient demonstrates progress as evidenced by continued participation and responsiveness to treatment. Patient denies SI/HI/self-harm thoughts at the end of group.     Suicidal/Homicidal: Nowithout intent/plan  Plan: Pt will continue in PHP while working to decrease depression and mood symptoms, increase ADLs, and increase ability to manage symptoms in a healthy manner.   Collaboration of Care: Medication Management AEB A Pashayan  Patient/Guardian was advised Release of Information must be obtained prior to any record release in order to collaborate their care with an outside provider. Patient/Guardian was advised if they have not already done so to contact the registration department to sign all necessary forms in order for [place] to release information regarding their care.   Consent: Patient/Guardian gives verbal consent for treatment and assignment of benefits for services provided during this visit. Patient/Guardian expressed understanding and agreed to proceed.   Diagnosis: Major depressive disorder, recurrent severe without psychotic features ([place]) [F33.2]    1. Major depressive disorder, recurrent severe without psychotic features ([place])   2. Generalized anxiety disorder with panic attacks   3. Borderline personality disorder ([place])   4. PTSD (post-traumatic stress disorder)       Royetta Crochet, [facility] 08/31/2022

## 2022-08-31 NOTE — Progress Notes (Signed)
Subjective: CC: Follow-up meds PCP: Janora Norlander, DO [id number] Marcia Gonzalez is a 27 y.o. female presenting to clinic today for:  1.  Major depressive disorder Patient is now being treated with Abilify, mirtazapine.  Mirtazapine was just started this weekend.  She does find it to be helpful with sleep but has not noticed if it is taking any effect on her anxiety.  She certainly feels like the Abilify is helping with her mood.  She is doing several hours of therapy per week and really is finding that to be helpful.  She "just wants to get her mind right".  She is tearful when she states this to me.  She has excellent support by her husband, who is present for today's visit as well.  2.  Hashimoto's thyroiditis Patient is compliant with her medications.  Last thyroid labs were within normal range.  She was previously seen with [facility] but wants to have her care moved over to [facility] if possible.  Requesting referral today  3.  URI Patient reports productive cough over the last several days that have gotten worse.  She started taking Claritin-D at the recommendation of her pharmacist.  She brings a sample of sputum to me today which appears purulent.  No reports of fevers or bodyaches.  Had COVID about a month ago.   ROS: Per HPI  Allergies  Allergen Reactions   Codeine Nausea Only   Amoxicillin-Pot Clavulanate Other (See Comments)    Hallucinations   Misoprostol     Left blisters in mouth   Oxycodone Hives and Itching    Tylenol 3   Pollen Extract Other (See Comments)   Other Rash    Latex tape, causes reaction and blisters.   Past Medical History:  Diagnosis Date   Allergy    Anemia    Anxiety    Arthritis    Asthma    Depression    Endometriosis    GERD (gastroesophageal reflux disease)    Hypertension    gestational   Hypothyroidism    IIH (idiopathic intracranial hypertension)    Pregnancy induced hypertension    UTI (urinary tract infection)    Wells'  syndrome     Current Outpatient Medications:    acetaminophen (TYLENOL) 325 MG tablet, Take 2 tablets (650 mg total) by mouth every 4 (four) hours., Disp: , Rfl:    ARIPiprazole (ABILIFY) 5 MG tablet, Take 1 tablet (5 mg total) by mouth daily., Disp: 30 tablet, Rfl: 0   escitalopram (LEXAPRO) 20 MG tablet, Take 1 tablet (20 mg total) by mouth daily., Disp: 90 tablet, Rfl: 3   ferrous sulfate 325 (65 FE) MG tablet, Take 325 mg by mouth daily with breakfast., Disp: , Rfl:    ibuprofen (ADVIL) 600 MG tablet, Take 1 tablet (600 mg total) by mouth every 6 (six) hours., Disp: 30 tablet, Rfl: 1   mirtazapine (REMERON) 7.5 MG tablet, Take 1 tablet (7.5 mg total) by mouth at bedtime., Disp: 30 tablet, Rfl: 0   pantoprazole (PROTONIX) 40 MG tablet, Take 1 tablet (40 mg total) by mouth daily as needed., Disp: 90 tablet, Rfl: 3   polyethylene glycol (MIRALAX / GLYCOLAX) 17 g packet, Take 17 g by mouth daily., Disp: 30 each, Rfl: 1   Prenatal Vit-Fe Fumarate-FA (PRENATAL MULTIVITAMIN) TABS tablet, Take 1 tablet by mouth daily at 12 noon., Disp: , Rfl:    SYNTHROID 88 MCG tablet, Take 1 tablet (88 mcg total) by mouth daily before breakfast., Disp:  90 tablet, Rfl: 3 Social History   Socioeconomic History   Marital status: Married    Spouse name: Darlyn Chamber   Number of children: 1   Years of education: 12   Highest education level: High school graduate  Occupational History   Not on file  Tobacco Use   Smoking status: Never    Passive exposure: Never   Smokeless tobacco: Never  Vaping Use   Vaping Use: Never used  Substance and Sexual Activity   Alcohol use: Not Currently   Drug use: Never   Sexual activity: Yes    Birth control/protection: None  Other Topics Concern   Not on file  Social History Narrative   Not on file   Social Determinants of Health   Financial Resource Strain: Low Risk  (04/13/2022)   Overall Financial Resource Strain (CARDIA)    Difficulty of Paying Living Expenses: Not  very hard  Food Insecurity: No Food Insecurity (04/13/2022)   Hunger Vital Sign    Worried About Running Out of Food in the Last Year: Never true    Ran Out of Food in the Last Year: Never true  Transportation Needs: No Transportation Needs (04/13/2022)   PRAPARE - Hydrologist (Medical): No    Lack of Transportation (Non-Medical): No  Physical Activity: Insufficiently Active (04/13/2022)   Exercise Vital Sign    Days of Exercise per Week: 1 day    Minutes of Exercise per Session: 10 min  Stress: No Stress Concern Present (04/13/2022)   [place]    Feeling of Stress : Only a little  Social Connections: Socially Integrated (04/13/2022)   Social Connection and Isolation Panel [NHANES]    Frequency of Communication with Friends and Family: More than three times a week    Frequency of Social Gatherings with Friends and Family: Once a week    Attends Religious Services: More than 4 times per year    Active Member of [employer] or Organizations: Yes    Attends Music therapist: More than 4 times per year    Marital Status: Married  Human resources officer Violence: Not At Risk (04/13/2022)   Humiliation, Afraid, Rape, and Kick questionnaire    Fear of Current or Ex-Partner: No    Emotionally Abused: No    Physically Abused: No    Sexually Abused: No   Family History  Problem Relation Age of Onset   Kidney disease Mother    Hypertension Mother    Hyperlipidemia Mother    Depression Mother    Cancer Mother        cervical   Arthritis Mother    Anxiety disorder Mother    Depression Father    Anxiety disorder Father    Asthma Brother    COPD Maternal Grandmother    Arthritis Maternal Grandmother     Objective: Office vital signs reviewed. BP (!) 136/92   Pulse 94   Temp 98.8 F (37.1 C)   Ht 5' 5"$  (1.651 m)   Wt 242 lb (109.8 kg)   LMP  (LMP Unknown)   SpO2 100%   Breastfeeding  No   BMI 40.27 kg/m   Physical Examination:  General: Awake, alert, well nourished, No acute distress HEENT: Normal    Neck: No masses palpated. No lymphadenopathy    Ears: Tympanic membranes intact, normal light reflex, no erythema, no bulging    Eyes: PERRLA, extraocular membranes intact, sclera white  Nose: nasal turbinates moist, clear nasal discharge    Throat: moist mucus membranes, no erythema, no tonsillar exudate.  Airway is patent Cardio: regular rate and rhythm, S1S2 heard, no murmurs appreciated Pulm: clear to auscultation bilaterally, no wheezes, rhonchi or rales; normal work of breathing on room air Psych: She is pleasant, interactive.  Assessment/ Plan: 27 y.o. female   URI, acute - Plan: COVID-19, Flu A+B and RSV, cefdinir (OMNICEF) 300 MG capsule  Major depressive disorder, recurrent severe without psychotic features ([place]) - Plan: Vitamin B12, VITAMIN D 25 Hydroxy (Vit-D Deficiency, Fractures), Iron, TIBC and Ferritin Panel, CBC with Differential  Seizure-like activity ([place])  Hashimoto's thyroiditis - Plan: Ambulatory referral to Endocrinology  Given productive, purulent looking sputum ongoing to treat her empirically as an acute bacterial infection with Omnicef.  We did a triple swab to ensure that this is not influenza etc. but she does not present as such  Labs were obtained as per request of her psychiatrist.  We will copy these labs to him once available  No recurrent seizure-like activity.  Referral to endocrinology requested.  Last thyroid labs were within normal range.  Referral placed  Orders Placed This Encounter  Procedures   COVID-19, Flu A+B and RSV    Order Specific Question:   Previously tested for COVID-19    Answer:   No    Order Specific Question:   Resident in a congregate (group) care setting    Answer:   No    Order Specific Question:   Is the patient student?    Answer:   No    Order Specific Question:   Employed in healthcare  setting    Answer:   No    Order Specific Question:   Pregnant    Answer:   No    Order Specific Question:   Has patient completed COVID vaccination(s) (2 doses of Pfizer/Moderna 1 dose of [employer])    Answer:   No   Vitamin B12   VITAMIN D 25 Hydroxy (Vit-D Deficiency, Fractures)   Iron, TIBC and Ferritin Panel   CBC with Differential   No orders of the defined types were placed in this encounter.    Janora Norlander, DO [place] [phone]

## 2022-08-31 NOTE — Progress Notes (Signed)
BH MD/PA/NP PHP Progress Note   Virtual Visit via Video Note  I connected with Marcia Gonzalez on 08/31/22 at  9:00 AM EST by a video enabled telemedicine application and verified that I am speaking with the correct person using two identifiers.  Location: Patient: Home Provider: [facility]   I discussed the limitations of evaluation and management by telemedicine and the availability of in person appointments. The patient expressed understanding and agreed to proceed.   08/31/2022 11:36 AM Marcia Gonzalez  MRN:  [id number]  Chief Complaint:  Chief Complaint  Patient presents with   Follow-up   Depression   Anxiety   HPI:  Marcia Gonzalez is a 27 yr old female who presents via Virtual Video Visit for Follow Up and Medication Management, she enrolled in the [facility] Program on 08/17/2022.  PPHx is significant for Depression, GAD w/Panic, Claustrophobia, PTSD, ADHD, and Borderline Personality Disorder, and 1 Suicide Attempt (OD- 06/2022) and a remote history of Self Injurious Behavior (biting- last ~10 yrs ago), and no history of Psychiatric Hospitalizations.    She reports that she is not feeling too well today due to a cold and has mostly lost her voice.  She reports she is going to see her primary care doctor this afternoon.  She reports that she tolerated switching off the Prozac and onto the Remeron without significant issues.  She reports that the first day it was very sedating and she was a little dizzy but since then has not had that.  She reports that the intrusive thoughts have been less often but they still do consist of fears that something will happen to her son.  Discussed with her that given her cold we would not make any further changes to her medications at this time and consider possible changes at her next appointment and she was agreeable with this.  She reports no SI, HI, HI towards her son, or AVH.  She reports her sleep is improving.  She reports her appetite is good.  She reports  congestion, headache, and sore throat from her cold but otherwise reports no other concerns at present.   Visit Diagnosis:    ICD-10-CM   1. Major depressive disorder, recurrent severe without psychotic features ([place])  F33.2     2. Generalized anxiety disorder with panic attacks  F41.1    F41.0     3. Borderline personality disorder ([place])  F60.3     4. PTSD (post-traumatic stress disorder)  F43.10       Past Psychiatric History: Depression, GAD w/Panic, Claustrophobia, PTSD, ADHD, and Borderline Personality Disorder, and 1 Suicide Attempt (OD- 06/2022) and a remote history of Self Injurious Behavior (biting- last ~10 yrs ago), and no history of Psychiatric Hospitalizations.    Past Medical History:  Past Medical History:  Diagnosis Date   Allergy    Anemia    Anxiety    Arthritis    Asthma    Depression    Endometriosis    GERD (gastroesophageal reflux disease)    Hypertension    gestational   Hypothyroidism    IIH (idiopathic intracranial hypertension)    Pregnancy induced hypertension    UTI (urinary tract infection)    Wells' syndrome     Past Surgical History:  Procedure Laterality Date   ABDOMINAL EXPLORATION SURGERY  04/2021   COLONOSCOPY     DILATION AND CURETTAGE OF UTERUS     FINGER SURGERY     LAPAROTOMY     LUMBAR PUNCTURE  Family Psychiatric History: Mother- Post Partum Depression Father- Pain pill abuse No Known Suicides.  Family History:  Family History  Problem Relation Age of Onset   Kidney disease Mother    Hypertension Mother    Hyperlipidemia Mother    Depression Mother    Cancer Mother        cervical   Arthritis Mother    Anxiety disorder Mother    Depression Father    Anxiety disorder Father    Asthma Brother    COPD Maternal Grandmother    Arthritis Maternal Grandmother     Social History:  Social History   Socioeconomic History   Marital status: Married    Spouse name: Darlyn Chamber   Number of children: 1   Years of  education: 12   Highest education level: High school graduate  Occupational History   Not on file  Tobacco Use   Smoking status: Never    Passive exposure: Never   Smokeless tobacco: Never  Vaping Use   Vaping Use: Never used  Substance and Sexual Activity   Alcohol use: Not Currently   Drug use: Never   Sexual activity: Yes    Birth control/protection: None  Other Topics Concern   Not on file  Social History Narrative   Not on file   Social Determinants of Health   Financial Resource Strain: Low Risk  (04/13/2022)   Overall Financial Resource Strain (CARDIA)    Difficulty of Paying Living Expenses: Not very hard  Food Insecurity: No Food Insecurity (04/13/2022)   Hunger Vital Sign    Worried About Running Out of Food in the Last Year: Never true    Ran Out of Food in the Last Year: Never true  Transportation Needs: No Transportation Needs (04/13/2022)   PRAPARE - Hydrologist (Medical): No    Lack of Transportation (Non-Medical): No  Physical Activity: Insufficiently Active (04/13/2022)   Exercise Vital Sign    Days of Exercise per Week: 1 day    Minutes of Exercise per Session: 10 min  Stress: No Stress Concern Present (04/13/2022)   [place]    Feeling of Stress : Only a little  Social Connections: Socially Integrated (04/13/2022)   Social Connection and Isolation Panel [NHANES]    Frequency of Communication with Friends and Family: More than three times a week    Frequency of Social Gatherings with Friends and Family: Once a week    Attends Religious Services: More than 4 times per year    Active Member of [employer] or Organizations: Yes    Attends Music therapist: More than 4 times per year    Marital Status: Married    Allergies:  Allergies  Allergen Reactions   Codeine Nausea Only   Amoxicillin-Pot Clavulanate Other (See Comments)    Hallucinations    Misoprostol     Left blisters in mouth   Oxycodone Hives and Itching    Tylenol 3   Pollen Extract Other (See Comments)   Other Rash    Latex tape, causes reaction and blisters.    Metabolic Disorder Labs: No results found for: "HGBA1C", "MPG" No results found for: "PROLACTIN" Lab Results  Component Value Date   CHOL 220 (H) 06/30/2021   TRIG 160 (H) 06/30/2021   HDL 44 06/30/2021   CHOLHDL 5.0 (H) 06/30/2021   LDLCALC 147 (H) 06/30/2021   Lab Results  Component Value Date   TSH  0.665 07/29/2022   TSH 0.236 (L) 07/10/2022    Therapeutic Level Labs: No results found for: "LITHIUM" No results found for: "VALPROATE" No results found for: "CBMZ"  Current Medications: Current Outpatient Medications  Medication Sig Dispense Refill   acetaminophen (TYLENOL) 325 MG tablet Take 2 tablets (650 mg total) by mouth every 4 (four) hours.     ARIPiprazole (ABILIFY) 5 MG tablet Take 1 tablet (5 mg total) by mouth daily. 30 tablet 0   escitalopram (LEXAPRO) 20 MG tablet Take 1 tablet (20 mg total) by mouth daily. 90 tablet 3   ferrous sulfate 325 (65 FE) MG tablet Take 325 mg by mouth daily with breakfast.     ibuprofen (ADVIL) 600 MG tablet Take 1 tablet (600 mg total) by mouth every 6 (six) hours. 30 tablet 1   mirtazapine (REMERON) 7.5 MG tablet Take 1 tablet (7.5 mg total) by mouth at bedtime. 30 tablet 0   pantoprazole (PROTONIX) 40 MG tablet Take 1 tablet (40 mg total) by mouth daily as needed. 90 tablet 3   polyethylene glycol (MIRALAX / GLYCOLAX) 17 g packet Take 17 g by mouth daily. 30 each 1   Prenatal Vit-Fe Fumarate-FA (PRENATAL MULTIVITAMIN) TABS tablet Take 1 tablet by mouth daily at 12 noon.     SYNTHROID 88 MCG tablet Take 1 tablet (88 mcg total) by mouth daily before breakfast. 90 tablet 3   No current facility-administered medications for this visit.     Musculoskeletal: Strength & Muscle Tone: within normal limits Gait & Station:  Sitting During Interview Patient  leans: N/A  Psychiatric Specialty Exam: Review of Systems  HENT:  Positive for congestion and sore throat.   Respiratory:  Negative for shortness of breath.   Cardiovascular:  Negative for chest pain.  Gastrointestinal:  Negative for abdominal pain, constipation, diarrhea, nausea and vomiting.  Neurological:  Positive for headaches. Negative for dizziness and weakness.  Psychiatric/Behavioral:  Negative for dysphoric mood, hallucinations, sleep disturbance and suicidal ideas. The patient is not nervous/anxious.     not currently breastfeeding.There is no height or weight on file to calculate BMI.  General Appearance: Casual and Fairly Groomed  Eye Contact:  Good  Speech:   minimal due to cold but coherent and normal rate  Volume:  Decreased  Mood:   "ok"  Affect:  Congruent  Thought Process:  Coherent and Goal Directed  Orientation:  Full (Time, Place, and Person)  Thought Content: WDL and Logical   Suicidal Thoughts:  No  Homicidal Thoughts:  No  Memory:  Immediate;   Good Recent;   Good  Judgement:  Good  Insight:  Good  Psychomotor Activity:  Normal  Concentration:  Concentration: Good and Attention Span: Good  Recall:  Good  Fund of Knowledge: Good  Language: Good  Akathisia:  Negative  Handed:  Right  AIMS (if indicated): not done  Assets:  Communication Skills Desire for Improvement Housing Resilience  ADL's:  Intact  Cognition: WNL  Sleep:   improving   Screenings: GAD-7    Flowsheet Row Office Visit from 05/18/2022 in [place] Office Visit from 05/04/2022 in [place] Office Visit from 04/22/2022 in [place] Routine Prenatal from 01/28/2022 in [facility] for [place] at [facility] Initial Prenatal from 10/21/2021 in [facility] for [place] at [facility]  Total GAD-7 Score 20 6 4 12 8      $ PHQ2-9  Flowsheet Row  Counselor from 08/21/2022 in [facility] Counselor from 08/12/2022 in [place] Video Visit from 08/07/2022 in [place] at [place] Visit from 05/18/2022 in [place] Office Visit from 05/04/2022 in [facility]  PHQ-2 Total Score 6 6 6 4 1  $ PHQ-9 Total Score 21 25 22 20 8      $ Flowsheet Row Counselor from 08/21/2022 in [facility] Counselor from 08/12/2022 in [place] Video Visit from 08/07/2022 in [place] at [place] Error: Q3, 4, or 5 should not be populated when Q2 is No Error: Q3, 4, or 5 should not be populated when Q2 is No High Risk        Assessment and Plan:  Marcia Gonzalez is a 27 yr old female who presents via Virtual Video Visit for Follow Up and Medication Management, she enrolled in the [facility] Program on 08/17/2022.  PPHx is significant for Depression, GAD w/Panic, Claustrophobia, PTSD, ADHD, and Borderline Personality Disorder, and 1 Suicide Attempt (OD- 06/2022) and a remote history of Self Injurious Behavior (biting- last ~10 yrs ago), and no history of Psychiatric Hospitalizations.     Marcia Gonzalez tolerated switching off the Lexapro and onto Remeron well.  As she currently has a cold she is feeling worse.  We will not make any changes to her medications at this time.  We will consider further increases to Remeron at next appointment.  We will continue to monitor.   MDD, Recurrent, Severe, w/out Psychosis  GAD w/ Panic Disorder  PTSD: -Continue Remeron 7.5 mg QHS for depression and anxiety.  No refills sent at this time. -Continue Abilify 5 mg daily.  No refills sent at this time.    Collaboration of Care: Collaboration of Care: Other PHP Program  Patient/Guardian was advised  Release of Information must be obtained prior to any record release in order to collaborate their care with an outside provider. Patient/Guardian was advised if they have not already done so to contact the registration department to sign all necessary forms in order for [place] to release information regarding their care.   Consent: Patient/Guardian gives verbal consent for treatment and assignment of benefits for services provided during this visit. Patient/Guardian expressed understanding and agreed to proceed.    Briant Cedar, MD 08/31/2022, 11:36 AM  Follow Up Instructions:    I discussed the assessment and treatment plan with the patient. The patient was provided an opportunity to ask questions and all were answered. The patient agreed with the plan and demonstrated an understanding of the instructions.   The patient was advised to call back or seek an in-person evaluation if the symptoms worsen or if the condition fails to improve as anticipated.  I provided 15 minutes of non-face-to-face time during this encounter.   Briant Cedar, MD

## 2022-09-01 ENCOUNTER — Other Ambulatory Visit: Payer: Self-pay | Admitting: Family Medicine

## 2022-09-01 ENCOUNTER — Telehealth (HOSPITAL_COMMUNITY): Payer: Medicaid Other | Admitting: Psychiatry

## 2022-09-01 ENCOUNTER — Ambulatory Visit (HOSPITAL_COMMUNITY): Payer: BC Managed Care – PPO

## 2022-09-01 ENCOUNTER — Encounter (HOSPITAL_COMMUNITY): Payer: Self-pay

## 2022-09-01 DIAGNOSIS — E559 Vitamin D deficiency, unspecified: Secondary | ICD-10-CM

## 2022-09-01 LAB — CBC WITH DIFFERENTIAL/PLATELET
Basophils Absolute: 0 10*3/uL (ref 0.0–0.2)
Basos: 1 %
EOS (ABSOLUTE): 0.2 10*3/uL (ref 0.0–0.4)
Eos: 3 %
Hematocrit: 38 % (ref 34.0–46.6)
Hemoglobin: 12.4 g/dL (ref 11.1–15.9)
Immature Grans (Abs): 0 10*3/uL (ref 0.0–0.1)
Immature Granulocytes: 0 %
Lymphocytes Absolute: 2.6 10*3/uL (ref 0.7–3.1)
Lymphs: 32 %
MCH: 27.8 pg (ref 26.6–33.0)
MCHC: 32.6 g/dL (ref 31.5–35.7)
MCV: 85 fL (ref 79–97)
Monocytes Absolute: 0.6 10*3/uL (ref 0.1–0.9)
Monocytes: 8 %
Neutrophils Absolute: 4.8 10*3/uL (ref 1.4–7.0)
Neutrophils: 56 %
Platelets: 369 10*3/uL (ref 150–450)
RBC: 4.46 x10E6/uL (ref 3.77–5.28)
RDW: 13.6 % (ref 11.7–15.4)
WBC: 8.3 10*3/uL (ref 3.4–10.8)

## 2022-09-01 LAB — IRON,TIBC AND FERRITIN PANEL
Ferritin: 25 ng/mL (ref 15–150)
Iron Saturation: 7 % — CL (ref 15–55)
Iron: 28 ug/dL (ref 27–159)
Total Iron Binding Capacity: 386 ug/dL (ref 250–450)
UIBC: 358 ug/dL (ref 131–425)

## 2022-09-01 LAB — VITAMIN D 25 HYDROXY (VIT D DEFICIENCY, FRACTURES): Vit D, 25-Hydroxy: 11.9 ng/mL — ABNORMAL LOW (ref 30.0–100.0)

## 2022-09-01 LAB — VITAMIN B12: Vitamin B-12: 319 pg/mL (ref 232–1245)

## 2022-09-01 MED ORDER — VITAMIN D (ERGOCALCIFEROL) 1.25 MG (50000 UNIT) PO CAPS: 50000.0000 [IU] | ORAL_CAPSULE | ORAL | 0 refills | Status: DC

## 2022-09-02 ENCOUNTER — Ambulatory Visit (INDEPENDENT_AMBULATORY_CARE_PROVIDER_SITE_OTHER): Payer: BC Managed Care – PPO

## 2022-09-02 DIAGNOSIS — F410 Panic disorder [episodic paroxysmal anxiety] without agoraphobia: Secondary | ICD-10-CM

## 2022-09-02 DIAGNOSIS — F332 Major depressive disorder, recurrent severe without psychotic features: Secondary | ICD-10-CM | POA: Diagnosis not present

## 2022-09-02 LAB — COVID-19, FLU A+B AND RSV
Influenza A, NAA: NOT DETECTED
Influenza B, NAA: NOT DETECTED
RSV, NAA: NOT DETECTED
SARS-CoV-2, NAA: NOT DETECTED

## 2022-09-03 ENCOUNTER — Ambulatory Visit (INDEPENDENT_AMBULATORY_CARE_PROVIDER_SITE_OTHER): Payer: Medicaid Other | Admitting: Licensed Clinical Social Worker

## 2022-09-03 DIAGNOSIS — F332 Major depressive disorder, recurrent severe without psychotic features: Secondary | ICD-10-CM

## 2022-09-03 DIAGNOSIS — F410 Panic disorder [episodic paroxysmal anxiety] without agoraphobia: Secondary | ICD-10-CM

## 2022-09-04 ENCOUNTER — Ambulatory Visit (INDEPENDENT_AMBULATORY_CARE_PROVIDER_SITE_OTHER): Payer: Medicaid Other | Admitting: Licensed Clinical Social Worker

## 2022-09-04 ENCOUNTER — Encounter (HOSPITAL_COMMUNITY): Payer: Self-pay

## 2022-09-04 DIAGNOSIS — F332 Major depressive disorder, recurrent severe without psychotic features: Secondary | ICD-10-CM

## 2022-09-04 NOTE — Progress Notes (Unsigned)
Spoke with patient via WebEx video call, used 2 identifiers to correctly identify patient. States that groups are going well. Talked about how being a new mom is stressful. She is enjoying groups. On scale 1-10 as 10 being worst she rates depression at 3/4 and anxiety at 5/6. Denies SI/HI or AV hallucinations. PHQ9=12. Today is her last day. No issues or complaints. No side effects from medications.

## 2022-09-04 NOTE — Progress Notes (Unsigned)
Virtual Visit via Video Note  I connected with Marcia Gonzalez on 09/04/22 at  9:00 AM EST by a video enabled telemedicine application and verified that I am speaking with the correct person using two identifiers.  Location: Patient: Home  Provider: Office   I discussed the limitations of evaluation and management by telemedicine and the availability of in person appointments. The patient expressed understanding and agreed to proceed.   I discussed the assessment and treatment plan with the patient. The patient was provided an opportunity to ask questions and all were answered. The patient agreed with the plan and demonstrated an understanding of the instructions.   The patient was advised to call back or seek an in-person evaluation if the symptoms worsen or if the condition fails to improve as anticipated.  I provided 15 minutes of non-face-to-face time during this encounter.   Derrill Center, NP   [facility]  Marcia Gonzalez [id number]  Admission date: 08/17/2022  Discharge date: 09/04/2022  Reason for admission: Per admission assessment: "Marcia Gonzalez is a 27 yr old female who presents via Virtual Video Visit to [place] and for Medication Management, she enrolled in the [facility] Program on 08/17/2022.  PPHx is significant for Depression, GAD w/Panic, Claustrophobia, PTSD, ADHD, and Borderline Personality Disorder, and 1 Suicide Attempt (OD- 06/2022) and a remote history of Self Injurious Behavior (biting- last ~10 yrs ago), and no history of Psychiatric Hospitalizations."   Progress in Program Toward Treatment Goals: Initial -ongoing, reports she is currently seeking therapy services, patient is currently prescribed Abilify and mirtazapine.  Will make medication refills available  Progress (rationale): Marcia Gonzalez attended and participated in daily group sessions with active and engaged participation.  She is  denying suicidal or homicidal ideation at discharge.  Denies auditory visual hallucinations.  Patient has follow-up appointments with her primary care provider psychiatrist Marcia Gonzalez.  Reports she has been medication compliant and tolerating medications well.  Collaboration of Care: Medication Management AEB Abilify and mirtazapine and Psychiatrist AEB psychiatrist Marcia Gonzalez  Patient/Guardian was advised Release of Information must be obtained prior to any record release in order to collaborate their care with an outside provider. Patient/Guardian was advised if they have not already done so to contact the registration department to sign all necessary forms in order for [place] to release information regarding their care.   Consent: Patient/Guardian gives verbal consent for treatment and assignment of benefits for services provided during this visit. Patient/Guardian expressed understanding and agreed to proceed.   Marcia Gonzalez- NP   09/04/2022

## 2022-09-06 ENCOUNTER — Telehealth: Payer: BC Managed Care – PPO | Admitting: Family

## 2022-09-06 DIAGNOSIS — B379 Candidiasis, unspecified: Secondary | ICD-10-CM | POA: Diagnosis not present

## 2022-09-06 DIAGNOSIS — T3695XA Adverse effect of unspecified systemic antibiotic, initial encounter: Secondary | ICD-10-CM

## 2022-09-06 MED ORDER — FLUCONAZOLE 150 MG PO TABS: 150.0000 mg | ORAL_TABLET | ORAL | 0 refills | Status: DC | PRN

## 2022-09-06 NOTE — Progress Notes (Signed)

## 2022-09-07 ENCOUNTER — Encounter (HOSPITAL_COMMUNITY): Payer: Self-pay

## 2022-09-07 ENCOUNTER — Ambulatory Visit (HOSPITAL_COMMUNITY): Payer: Medicaid Other

## 2022-09-07 NOTE — Psych (Signed)
Virtual Visit via Video Note  I connected with Marcia Gonzalez on 09/04/22 at  9:00 AM EST by a video enabled telemedicine application and verified that I am speaking with the correct person using two identifiers.  Location: Patient: patient home Provider: clinical home office   I discussed the limitations of evaluation and management by telemedicine and the availability of in person appointments. The patient expressed understanding and agreed to proceed.  I discussed the assessment and treatment plan with the patient. The patient was provided an opportunity to ask questions and all were answered. The patient agreed with the plan and demonstrated an understanding of the instructions.   The patient was advised to call back or seek an in-person evaluation if the symptoms worsen or if the condition fails to improve as anticipated.  Pt was provided 240 minutes of non-face-to-face time during this encounter.   Lorin Glass, LCSW   [facility] PHP THERAPIST PROGRESS NOTE  Marcia Gonzalez [id number]  Session Time: 9:00 - 10:00   Participation Level: Active  Behavioral Response: CasualAlertDepressed  Type of Therapy: Group Therapy  Treatment Goals addressed: Coping  Progress Towards Goals: Progressing  Interventions: CBT, DBT, Supportive, and Reframing  Summary: Marcia Gonzalez is a 27 y.o. female who presents with depression and mood symptoms.  Clinician led check-in regarding current stressors and situation, and review of patient completed daily inventory. Clinician utilized active listening and empathetic response and validated patient emotions. Clinician facilitated processing group on pertinent issues.?    Therapist Response: Patient arrived within time allowed. Patient rates her mood at a 8 on a scale of 1-10 with 10 being best. Pt states she feels "pretty good." Pt states she slept 4 hours and ate 3x. Pt reports she thinks her medication is reaching full efficacy and she is feeling  less numb and more good. Pt reports increased ability to manage stressors. Patient able to process. Patient engaged in discussion.             Session Time: 10:00 am - 11:00 am   Participation Level: Active   Behavioral Response: CasualAlertDepressed   Type of Therapy: Group Therapy   Treatment Goals addressed: Coping   Progress Towards Goals: Progressing   Interventions: CBT, DBT, Solution Focused, Strength-based, Supportive, and Reframing   Therapist Response: Cln led discussion on guilt and the way it impacts [place]. Cln utilized CBT cognitive distortion: emotional reasoning to inform discussion. Cln encouraged pt's to consider whether the guilt was founded as a first step to address the feeling. Group members discussed feelings of guilt and worked to determine whether those feelings were founded in truth or feeling.    Therapist Response: Pt engaged in discussion and is able to offer a guilt example and work through it with the group.            Session Time: 11:00 -12:00   Participation Level: Active   Behavioral Response: CasualAlertDepressed   Type of Therapy: Group Therapy   Treatment Goals addressed: Coping   Progress Towards Goals: Progressing   Interventions: CBT, DBT, Solution Focused, Strength-based, Supportive, and Reframing   Summary: Cln led discussion on ways to manage stressors and feelings over the weekend. Group members  brainstormed things to do over the weekend for multiple levels of energy, access, and moods. Cln reviewed crisis services should they be needed and provided pt's with the text crisis line, mobile crisis, national suicide hotline, [facility] 24/7 line, and information on [facility].     Therapist Response:  Pt engaged in discussion and is able to identify 3 ideas of what to do over the weekend to keep their mind engaged.           Session Time: 12:00 -1:00   Participation Level: Active   Behavioral Response: CasualAlertDepressed    Type of Therapy: Group therapy, Occupational Therapy   Treatment Goals addressed: Coping   Progress Towards Goals: Progressing   Interventions: Supportive; Psychoeducation   Summary: 12:00 - 12:50: Occupational Therapy group led by cln E. Hollan. 12:50 - 1:00 Clinician assessed for immediate needs, medication compliance and efficacy, and safety concerns.   Therapist Response: 12:00 - 12:50: Pt participated 12:50 - 1:00 pm: At check-out, patient reports no immediate concerns. Patient demonstrates progress as evidenced by continued participation and responsiveness to treatment. Patient denies SI/HI/self-harm thoughts at the end of group.    Suicidal/Homicidal: Nowithout intent/plan  Plan: Pt will discharge from PHP per her request. Pt made progress on treatment goals of decreased depression and mood symptoms, increased ADLs, and increased ability to manage symptoms in a healthy manner. Pt will return to her psychiatrist and is given referrals for trauma therapy per her request. Pt and provider are aligned with discharge plan. Pt denies SI/HI at discharge.   Collaboration of Care: Medication Management AEB A Pashayan  Patient/Guardian was advised Release of Information must be obtained prior to any record release in order to collaborate their care with an outside provider. Patient/Guardian was advised if they have not already done so to contact the registration department to sign all necessary forms in order for [place] to release information regarding their care.   Consent: Patient/Guardian gives verbal consent for treatment and assignment of benefits for services provided during this visit. Patient/Guardian expressed understanding and agreed to proceed.   Diagnosis: Major depressive disorder, recurrent severe without psychotic features ([place]) [F33.2]    1. Major depressive disorder, recurrent severe without psychotic features ([place])   2. Generalized anxiety disorder with panic attacks        Lorin Glass, LCSW 09/07/2022

## 2022-09-07 NOTE — Psych (Signed)
Virtual Visit via Video Note  I connected with [name] on 09/03/22 at  9:00 AM EST by a video enabled telemedicine application and verified that I am speaking with the correct person using two identifiers.  Location: Patient: patient home Provider: clinical home office   I discussed the limitations of evaluation and management by telemedicine and the availability of in person appointments. The patient expressed understanding and agreed to proceed.  I discussed the assessment and treatment plan with the patient. The patient was provided an opportunity to ask questions and all were answered. The patient agreed with the plan and demonstrated an understanding of the instructions.   The patient was advised to call back or seek an in-person evaluation if the symptoms worsen or if the condition fails to improve as anticipated.  Pt was provided 240 minutes of non-face-to-face time during this encounter.   Lorin Glass, LCSW   [facility] PHP THERAPIST PROGRESS NOTE  Adele Tillison [id number]  Session Time: 9:00 - 10:00   Participation Level: Active  Behavioral Response: CasualAlertDepressed  Type of Therapy: Group Therapy  Treatment Goals addressed: Coping  Progress Towards Goals: Progressing  Interventions: CBT, DBT, Supportive, and Reframing  Summary: Camil Rolle is a 27 y.o. female who presents with depression and mood symptoms.  Clinician led check-in regarding current stressors and situation, and review of patient completed daily inventory. Clinician utilized active listening and empathetic response and validated patient emotions. Clinician facilitated processing group on pertinent issues.?    Therapist Response: Patient arrived within time allowed. Patient rates her mood at a 4.5 on a scale of 1-10 with 10 being best. Pt states she feels "a little sad." Pt states she slept 5 hours and ate 3x. Pt reports the anniversary of losing one of her children is upcoming and pt is  sad thinking about it. Pt reports noticing progress in her ability to manage difficult feelings. Patient able to process. Patient engaged in discussion.             Session Time: 10:00 am - 11:00 am   Participation Level: Active   Behavioral Response: CasualAlertDepressed   Type of Therapy: Group Therapy   Treatment Goals addressed: Coping   Progress Towards Goals: Progressing   Interventions: CBT, DBT, Solution Focused, Strength-based, Supportive, and Reframing   Therapist Response: Cln led discussion on knowing our limits, especially when they need to be adjusted due to fluctuation in our energy, mood, and functioning. Cln discussed limits may be taking a break from a conversation, scheduling less activities during the day, and/or being willing to stop and rest when needed. Group members shared times they have been exhausted or overwhelmed recently, and cln helped work back to determine where setting a limit may have prevented them from getting to that point. Group discussed barriers to setting limits for themselves and brainstormed ways to address those barriers.    Therapist Response: Pt engaged in discussion and is able to identify where they could set limits to help their recovery.            Session Time: 11:00 -12:00   Participation Level: Active   Behavioral Response: CasualAlertDepressed   Type of Therapy: Group Therapy   Treatment Goals addressed: Coping   Progress Towards Goals: Progressing   Interventions: CBT, DBT, Solution Focused, Strength-based, Supportive, and Reframing   Summary: Cln continued topic of CBT cognitive distortions. Cln utilized Web designer questions" as a way to introduce challenges and reframe distorted thinking. Group members worked through  pt examples to practice challenging distorted thinking.    Therapist Response: Pt engaged in discussion and demonstrates understanding of challenging distorted thoughts through practice.            Session Time: 12:00 -1:00   Participation Level: Active   Behavioral Response: CasualAlertDepressed   Type of Therapy: Group therapy, Occupational Therapy   Treatment Goals addressed: Coping   Progress Towards Goals: Progressing   Interventions: Supportive; Psychoeducation   Summary: 12:00 - 12:50: Occupational Therapy group led by cln E. Hollan. 12:50 - 1:00 Clinician assessed for immediate needs, medication compliance and efficacy, and safety concerns.   Therapist Response: 12:00 - 12:50: Pt participated 12:50 - 1:00 pm: At check-out, patient reports no immediate concerns. Patient demonstrates progress as evidenced by continued participation and responsiveness to treatment. Patient denies SI/HI/self-harm thoughts at the end of group.     Suicidal/Homicidal: Nowithout intent/plan  Plan: Pt will continue in PHP while working to decrease depression and mood symptoms, increase ADLs, and increase ability to manage symptoms in a healthy manner.   Collaboration of Care: Medication Management AEB A Pashayan  Patient/Guardian was advised Release of Information must be obtained prior to any record release in order to collaborate their care with an outside provider. Patient/Guardian was advised if they have not already done so to contact the registration department to sign all necessary forms in order for [place] to release information regarding their care.   Consent: Patient/Guardian gives verbal consent for treatment and assignment of benefits for services provided during this visit. Patient/Guardian expressed understanding and agreed to proceed.   Diagnosis: Major depressive disorder, recurrent severe without psychotic features ([place]) [F33.2]    1. Major depressive disorder, recurrent severe without psychotic features ([place])   2. Generalized anxiety disorder with panic attacks       Lorin Glass, LCSW 09/07/2022

## 2022-09-07 NOTE — Psych (Signed)
Virtual Visit via Video Note  I connected with [name] on 09/02/22 at  9:00 AM EST by a video enabled telemedicine application and verified that I am speaking with the correct person using two identifiers.  Location: Patient: patient home Provider: clinical home office   I discussed the limitations of evaluation and management by telemedicine and the availability of in person appointments. The patient expressed understanding and agreed to proceed.  I discussed the assessment and treatment plan with the patient. The patient was provided an opportunity to ask questions and all were answered. The patient agreed with the plan and demonstrated an understanding of the instructions.   The patient was advised to call back or seek an in-person evaluation if the symptoms worsen or if the condition fails to improve as anticipated.  Pt was provided 240 minutes of non-face-to-face time during this encounter.   Lorin Glass, LCSW   [facility] PHP THERAPIST PROGRESS NOTE  Marcia Gonzalez [id number]  Session Time: 9:00 - 10:00   Participation Level: Active  Behavioral Response: CasualAlertDepressed  Type of Therapy: Group Therapy  Treatment Goals addressed: Coping  Progress Towards Goals: Progressing  Interventions: CBT, DBT, Supportive, and Reframing  Summary: Marcia Gonzalez is a 27 y.o. female who presents with depression and mood symptoms.  Clinician led check-in regarding current stressors and situation, and review of patient completed daily inventory. Clinician utilized active listening and empathetic response and validated patient emotions. Clinician facilitated processing group on pertinent issues.?    Therapist Response: Patient arrived within time allowed. Patient rates her mood at a 8 on a scale of 1-10 with 10 being best. Pt states she feels "frustrated." Pt states she slept 3 hours and ate 2x. Pt reports "feeling pretty good" and states she is noticing the progress she is  making. Pt denies SI/HI.  Patient able to process. Patient engaged in discussion.             Session Time: 10:00 am - 11:00 am   Participation Level: Active   Behavioral Response: CasualAlertDepressed   Type of Therapy: Group Therapy   Treatment Goals addressed: Coping   Progress Towards Goals: Progressing   Interventions: CBT, DBT, Solution Focused, Strength-based, Supportive, and Reframing   Therapist Response: Cln led processing group for pt's current struggles. Group members shared stressors and provided support and feedback. Cln brought in topics of boundaries, healthy relationships, and unhealthy thought processes to inform discussion.    Therapist Response:  Pt able to process and provide support to group.          Session Time: 11:00 -12:00   Participation Level: Active   Behavioral Response: CasualAlertDepressed   Type of Therapy: Group Therapy, Spiritual Care   Treatment Goals addressed: Coping   Progress Towards Goals: Progressing   Interventions: Supportive, Education   Summary:  Marcia Gonzalez, Chaplain, led group.   Therapist Response: Pt participated           Session Time: 12:00 -1:00   Participation Level: Active   Behavioral Response: CasualAlertDepressed   Type of Therapy: Group therapy, Occupational Therapy   Treatment Goals addressed: Coping   Progress Towards Goals: Progressing   Interventions: Supportive; Psychoeducation   Summary: 12:00 - 12:50: Occupational Therapy group led by cln E. Hollan. 12:50 - 1:00 Clinician assessed for immediate needs, medication compliance and efficacy, and safety concerns.   Therapist Response: 12:00 - 12:50: Pt participated 12:50 - 1:00 pm: At check-out, patient reports no immediate concerns. Patient demonstrates progress as evidenced  by continued participation and responsiveness to treatment. Patient denies SI/HI/self-harm thoughts at the end of group.     Suicidal/Homicidal: Nowithout  intent/plan  Plan: Pt will continue in PHP while working to decrease depression and mood symptoms, increase ADLs, and increase ability to manage symptoms in a healthy manner.   Collaboration of Care: Medication Management AEB A Pashayan  Patient/Guardian was advised Release of Information must be obtained prior to any record release in order to collaborate their care with an outside provider. Patient/Guardian was advised if they have not already done so to contact the registration department to sign all necessary forms in order for Marcia Gonzalez to release information regarding their care.   Consent: Patient/Guardian gives verbal consent for treatment and assignment of benefits for services provided during this visit. Patient/Guardian expressed understanding and agreed to proceed.   Diagnosis: Major depressive disorder, recurrent severe without psychotic features ([place]) [F33.2]    1. Major depressive disorder, recurrent severe without psychotic features ([place])   2. Generalized anxiety disorder with panic attacks       Lorin Glass, LCSW 09/07/2022

## 2022-09-08 ENCOUNTER — Encounter (HOSPITAL_COMMUNITY): Payer: Self-pay | Admitting: Psychiatry

## 2022-09-08 ENCOUNTER — Telehealth (INDEPENDENT_AMBULATORY_CARE_PROVIDER_SITE_OTHER): Payer: Medicaid Other | Admitting: Psychiatry

## 2022-09-08 DIAGNOSIS — F332 Major depressive disorder, recurrent severe without psychotic features: Secondary | ICD-10-CM

## 2022-09-08 DIAGNOSIS — E559 Vitamin D deficiency, unspecified: Secondary | ICD-10-CM

## 2022-09-08 DIAGNOSIS — F411 Generalized anxiety disorder: Secondary | ICD-10-CM | POA: Diagnosis not present

## 2022-09-08 DIAGNOSIS — F603 Borderline personality disorder: Secondary | ICD-10-CM

## 2022-09-08 DIAGNOSIS — F410 Panic disorder [episodic paroxysmal anxiety] without agoraphobia: Secondary | ICD-10-CM

## 2022-09-08 DIAGNOSIS — F4310 Post-traumatic stress disorder, unspecified: Secondary | ICD-10-CM | POA: Diagnosis not present

## 2022-09-08 DIAGNOSIS — F5081 Binge eating disorder: Secondary | ICD-10-CM

## 2022-09-08 HISTORY — DX: Vitamin D deficiency, unspecified: E55.9

## 2022-09-08 MED ORDER — MIRTAZAPINE 15 MG PO TABS: 15.0000 mg | ORAL_TABLET | Freq: Every day | ORAL | 1 refills | Status: DC

## 2022-09-08 MED ORDER — ARIPIPRAZOLE 5 MG PO TABS: 5.0000 mg | ORAL_TABLET | Freq: Every evening | ORAL | 0 refills | Status: DC
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
## 2022-09-08 NOTE — Patient Instructions (Addendum)
We increased the Remeron to 15 mg nightly today.  I also sent a refill of the Abilify.  Keep up the good work and using the skills that she learned from the partial hospitalization.  You can use the following DBT PDF to begin some of the work before your trauma focused therapy starts: [email or web address]

## 2022-09-09 ENCOUNTER — Encounter: Payer: Self-pay | Admitting: Family Medicine

## 2022-09-10 ENCOUNTER — Telehealth: Payer: BC Managed Care – PPO | Admitting: Physician Assistant

## 2022-09-10 DIAGNOSIS — N760 Acute vaginitis: Secondary | ICD-10-CM

## 2022-09-10 MED ORDER — FLUCONAZOLE 150 MG PO TABS: 150.0000 mg | ORAL_TABLET | ORAL | 0 refills | Status: DC | PRN

## 2022-09-10 NOTE — Progress Notes (Signed)
Because of ongoing symptoms despite 3 doses of Diflucan given via e-visit, I feel your condition warrants further evaluation and I recommend that you be seen in a face to face visit.   NOTE: There will be NO CHARGE for this eVisit   If you are having a true medical emergency please call 911.      For an urgent face to face visit, [place] has eight urgent care centers for your convenience:   NEW!! [facility] [place] at [place] Get Driving Directions [id number] [address] [place], [place] [facility] [place] at [place] Get Driving Directions [id number] [place], [place] [address]   [place] [facility]) Get Driving Directions [id number] [address] [place], [place] [address]  [facility] [place] ([place]) Get Driving Directions [id number] [address] [place],  [place]  [address]  [place] [facility] [place] [facility] at [place] Get Driving Directions  [id number] [place],  [place] [address]   [facility]  Get Driving Directions [id number] [address] [place], [place], [place] [address]   [facility] [place] Get Driving Directions  [id number] [address] [place], [place] [address]   [facility] at  Get Driving Directions [id number] [address], [place], [place] [address]  Your MyChart E-visit questionnaire answers were reviewed by a board certified advanced clinical practitioner to complete your personal care plan based on your specific symptoms.  Thank you for using e-Visits.

## 2022-09-14 ENCOUNTER — Telehealth: Payer: Self-pay | Admitting: Family Medicine

## 2022-09-14 NOTE — Telephone Encounter (Signed)
Did she just take 2 this week instead of 1?  If so, skip next week then start back taking every 7 days.

## 2022-09-14 NOTE — Telephone Encounter (Signed)
Spouse says that pt may have taken too much of Vitamin D, Ergocalciferol, (DRISDOL) 1.25 MG (50000 UNIT) CAPS capsule. Wants to know what to do to get back on track. Please call back

## 2022-09-17 ENCOUNTER — Ambulatory Visit: Payer: BC Managed Care – PPO | Admitting: Nutrition

## 2022-09-17 NOTE — Telephone Encounter (Signed)
Spoke to husband - pt has been informed on the correct way to take medication

## 2022-09-21 ENCOUNTER — Ambulatory Visit: Payer: BC Managed Care – PPO | Attending: Internal Medicine | Admitting: Internal Medicine

## 2022-09-21 ENCOUNTER — Telehealth (INDEPENDENT_AMBULATORY_CARE_PROVIDER_SITE_OTHER): Payer: Medicaid Other | Admitting: Psychiatry

## 2022-09-21 ENCOUNTER — Encounter (HOSPITAL_COMMUNITY): Payer: Self-pay | Admitting: Psychiatry

## 2022-09-21 ENCOUNTER — Encounter: Payer: Self-pay | Admitting: Internal Medicine

## 2022-09-21 VITALS — BP 108/70 | HR 83 | Ht 65.0 in | Wt 246.0 lb

## 2022-09-21 DIAGNOSIS — F410 Panic disorder [episodic paroxysmal anxiety] without agoraphobia: Secondary | ICD-10-CM

## 2022-09-21 DIAGNOSIS — F332 Major depressive disorder, recurrent severe without psychotic features: Secondary | ICD-10-CM | POA: Diagnosis not present

## 2022-09-21 DIAGNOSIS — D649 Anemia, unspecified: Secondary | ICD-10-CM

## 2022-09-21 DIAGNOSIS — F5081 Binge eating disorder: Secondary | ICD-10-CM

## 2022-09-21 DIAGNOSIS — F411 Generalized anxiety disorder: Secondary | ICD-10-CM

## 2022-09-21 DIAGNOSIS — F4310 Post-traumatic stress disorder, unspecified: Secondary | ICD-10-CM | POA: Diagnosis not present

## 2022-09-21 DIAGNOSIS — F603 Borderline personality disorder: Secondary | ICD-10-CM

## 2022-09-21 DIAGNOSIS — R55 Syncope and collapse: Secondary | ICD-10-CM | POA: Diagnosis not present

## 2022-09-21 NOTE — Patient Instructions (Signed)
We discontinued the Remeron today to see if this will help with some of that restlessness feeling.  If you find that your suicidal ideation comes back please let our office know but also please contact the emergency provide nurse that we have gone over and your safety plan.  1 of those numbers is 57 which is the mental health specific crisis line.  Keep doing the breathing that we practice as well as this challenge thoughts that we have gone over.

## 2022-09-21 NOTE — Patient Instructions (Addendum)
Medication Instructions:  Your physician recommends that you continue on your current medications as directed. Please refer to the Current Medication list given to you today.  Labwork: none  Testing/Procedures: Your physician has requested that you have an echocardiogram. Echocardiography is a painless test that uses sound waves to create images of your heart. It provides your doctor with information about the size and shape of your heart and how well your heart's chambers and valves are working. This procedure takes approximately one hour. There are no restrictions for this procedure. Please do NOT wear cologne, perfume, aftershave, or lotions (deodorant is allowed). Please arrive 15 minutes prior to your appointment time.  Follow-Up: Your physician recommends that you schedule a follow-up appointment in: as needed  Any Other Special Instructions Will Be Listed Below (If Applicable).  If you need a refill on your cardiac medications before your next appointment, please call your pharmacy.
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
## 2022-09-21 NOTE — Progress Notes (Signed)
Cardiology Office Note  Date: 09/21/2022   ID: Marcia Gonzalez, DOB August 12, 1995, MRN [id number]  PCP:  Janora Norlander, DO  Cardiologist:  Chalmers Guest, MD Electrophysiologist:  None   Reason for Office Visit: Evaluation of syncope at the request of Dr. Lajuana Ripple   History of Present Illness: Marcia Gonzalez is a 27 y.o. female known to have anxiety/depression was referred to cardiology clinic for evaluation of syncope.  Patient delivered a healthy baby 6 months ago after which she noticed syncopal episodes once or twice per month where she completely lost consciousness for 15 to 20 seconds with no postictal confusion. Has positional dizziness, when she gets up from sitting position. Orthostatic vitals today in the clinic showed no evidence of orthostatic hypotension and POTS. She had 1 episode of syncope when she had a miscarriage before her pregnancy. Otherwise, denies any chest pain, SOB, palpitations. EKG showed normal sinus rhythm, no evidence of ST-T changes and no evidence of preexcitation. She underwent event monitor in 07/2022 which was unremarkable. Patient also reported having severe anxiety after giving birth in the symptoms of flank severe anxiety included hand trembling/shaking.  Past Medical History:  Diagnosis Date   ADHD (attention deficit hyperactivity disorder)    Allergy    Anemia    Anxiety    Arthritis    Asthma    Constipation 08/14/2022   Depression    Endometriosis    GERD (gastroesophageal reflux disease)    History of borderline personality disorder    Hypertension    gestational   Hypothyroidism    IIH (idiopathic intracranial hypertension)    Pregnancy induced hypertension    PTSD (post-traumatic stress disorder)    Suicidal ideation 08/07/2022   UTI (urinary tract infection)    Wells' syndrome     Past Surgical History:  Procedure Laterality Date   ABDOMINAL EXPLORATION SURGERY  04/2021   COLONOSCOPY     DILATION AND CURETTAGE OF  UTERUS     FINGER SURGERY     LAPAROTOMY     LUMBAR PUNCTURE      Current Outpatient Medications  Medication Sig Dispense Refill   acetaminophen (TYLENOL) 325 MG tablet Take 2 tablets (650 mg total) by mouth every 4 (four) hours.     ARIPiprazole (ABILIFY) 5 MG tablet Take 1 tablet (5 mg total) by mouth at bedtime. 30 tablet 0   ferrous sulfate 325 (65 FE) MG tablet Take 325 mg by mouth daily with breakfast.     ibuprofen (ADVIL) 600 MG tablet Take 1 tablet (600 mg total) by mouth every 6 (six) hours. 30 tablet 1   mirtazapine (REMERON) 15 MG tablet Take 1 tablet (15 mg total) by mouth at bedtime. 30 tablet 1   pantoprazole (PROTONIX) 40 MG tablet Take 1 tablet (40 mg total) by mouth daily as needed. 90 tablet 3   polyethylene glycol (MIRALAX / GLYCOLAX) 17 g packet Take 17 g by mouth daily. 30 each 1   Prenatal Vit-Fe Fumarate-FA (PRENATAL MULTIVITAMIN) TABS tablet Take 1 tablet by mouth daily at 12 noon.     SYNTHROID 88 MCG tablet Take 1 tablet (88 mcg total) by mouth daily before breakfast. 90 tablet 3   Vitamin D, Ergocalciferol, (DRISDOL) 1.25 MG (50000 UNIT) CAPS capsule Take 1 capsule (50,000 Units total) by mouth every 7 (seven) days. X12 weeks. Then start OTC Vit D 400IU daily. (Patient not taking: Reported on 09/21/2022) 12 capsule 0   No current facility-administered medications for this visit.  Allergies:  Codeine, Amoxicillin-pot clavulanate, Latex, Misoprostol, Oxycodone, Pollen extract, and Other   Social History: The patient  reports that she has never smoked. She has never been exposed to tobacco smoke. She has never used smokeless tobacco. She reports that she does not currently use alcohol. She reports that she does not use drugs.   Family History: The patient's family history includes Anxiety disorder in her father and mother; Arthritis in her maternal grandmother and mother; Asthma in her brother; COPD in her maternal grandmother; Cancer in her mother; Depression in her  father and mother; Hyperlipidemia in her mother; Hypertension in her mother; Kidney disease in her mother.   ROS:  Please see the history of present illness. Otherwise, complete review of systems is positive for none.  All other systems are reviewed and negative.   Physical Exam: VS:  BP 108/70   Pulse 83   Ht '5\' 5"'$  (1.651 m)   Wt 246 lb (111.6 kg)   LMP  (LMP Unknown)   SpO2 100%   BMI 40.94 kg/m , BMI Body mass index is 40.94 kg/m.  Wt Readings from Last 3 Encounters:  09/21/22 246 lb (111.6 kg)  08/31/22 242 lb (109.8 kg)  07/29/22 230 lb 3.2 oz (104.4 kg)    General: Patient appears comfortable at rest. HEENT: Conjunctiva and lids normal, oropharynx clear with moist mucosa. Neck: Supple, no elevated JVP or carotid bruits, no thyromegaly. Lungs: Clear to auscultation, nonlabored breathing at rest. Cardiac: Regular rate and rhythm, no S3 or significant systolic murmur, no pericardial rub. Abdomen: Soft, nontender, no hepatomegaly, bowel sounds present, no guarding or rebound. Extremities: No pitting edema, distal pulses 2+. Skin: Warm and dry. Musculoskeletal: No kyphosis. Neuropsychiatric: Alert and oriented x3, affect grossly appropriate.  ECG:  An ECG dated 09/21/2022 was personally reviewed today and demonstrated:  Normal sinus, no ST changes  Recent Labwork: 07/29/2022: ALT 12; AST 16; BUN 11; Creatinine, Ser 0.73; Magnesium 2.0; Potassium 4.3; Sodium 139; TSH 0.665 08/31/2022: Hemoglobin 12.4; Platelets 369     Component Value Date/Time   CHOL 220 (H) 06/30/2021 1544   TRIG 160 (H) 06/30/2021 1544   HDL 44 06/30/2021 1544   CHOLHDL 5.0 (H) 06/30/2021 1544   LDLCALC 147 (H) 06/30/2021 1544    Other Studies Reviewed Today:   Assessment and Plan: Patient is a 27 year old F known to have anxiety/depression was referred to cardiology clinic for evaluation of syncope.  # Vasovagal syncope -Patient had 1 episode of syncope at the time of her miscarriage and started  having 1-2 episodes of syncope after giving birth to a healthy baby 6 months ago. She also has severe anxiety postpartum and currently seeing a psychiatrist for the same. EKG showed normal sinus rhythm, no ST-T changes and no evidence of preexcitation. Event monitor in 07/2022 was unremarkable and symptoms correlated with normal sinus rhythm. No evidence of preexcitation. Literature review showed association of vasovagal syncope with severe anxiety/depression and more research is needed to establish causality. I believe her symptoms are secondary to vasovagal syncope from severe anxiety given the syncopal episodes started after giving birth. I will obtain 2D echocardiogram to rule out any structural causes of syncope. Otherwise, she will need management of underlying anxiety.  I have spent a total of 30 minutes with patient reviewing chart, EKGs, labs and examining patient as well as establishing an assessment and plan that was discussed with the patient.  > 50% of time was spent in direct patient care.  Medication Adjustments/Labs and Tests Ordered: Current medicines are reviewed at length with the patient today.  Concerns regarding medicines are outlined above.   Tests Ordered: Orders Placed This Encounter  Procedures   ECHOCARDIOGRAM COMPLETE    Medication Changes: No orders of the defined types were placed in this encounter.   Disposition:  Follow up  as needed  Signed Jenniefer Salak Fidel Levy, MD, 09/21/2022 11:58 AM    [facility] [place], [place], [place] [address]

## 2022-09-22 ENCOUNTER — Ambulatory Visit (INDEPENDENT_AMBULATORY_CARE_PROVIDER_SITE_OTHER): Payer: Medicaid Other | Admitting: Adult Health

## 2022-09-22 ENCOUNTER — Encounter: Payer: Self-pay | Admitting: Adult Health

## 2022-09-22 VITALS — BP 108/74 | HR 84 | Ht 65.0 in | Wt 245.0 lb

## 2022-09-22 DIAGNOSIS — N3946 Mixed incontinence: Secondary | ICD-10-CM

## 2022-09-22 DIAGNOSIS — Z3202 Encounter for pregnancy test, result negative: Secondary | ICD-10-CM

## 2022-09-22 DIAGNOSIS — N926 Irregular menstruation, unspecified: Secondary | ICD-10-CM | POA: Insufficient documentation

## 2022-09-22 HISTORY — DX: Encounter for pregnancy test, result negative: Z32.02

## 2022-09-22 HISTORY — DX: Irregular menstruation, unspecified: N92.6

## 2022-09-22 LAB — POCT URINE PREGNANCY: Preg Test, Ur: NEGATIVE

## 2022-09-22 MED ORDER — OXYBUTYNIN CHLORIDE ER 10 MG PO TB24: 10.0000 mg | ORAL_TABLET | Freq: Every day | ORAL | 2 refills | Status: DC

## 2022-09-22 MED ORDER — MEDROXYPROGESTERONE ACETATE 10 MG PO TABS: ORAL_TABLET | ORAL | 0 refills | Status: DC

## 2022-09-22 NOTE — Progress Notes (Signed)
  Subjective:     Patient ID: Marcia Gonzalez, female   DOB: 1996-04-09, 27 y.o.   MRN: [id number]  HPI Marcia Gonzalez is a 27 year old white female,married, G3P1021 in complaining of no period in 3-4 months, has had one since delivery and urinary incontinence with sneezing and cough or if has to go or has gas. Has pain with sex too. Feels hot then cold and sweats at night. She says she had laparoscopic surgery and had endometriosis before last pregnancy.  She had thyroid checked in December.   Has normal pap in 2023 she says.  PCP is Dr Lajuana Ripple  Review of Systems +missed periods +urinary incontinence Night sweats, then hot and cold +pain with sex  Reviewed past medical,surgical, social and family history. Reviewed medications and allergies.     Objective:   Physical Exam BP 108/74 (BP Location: Left Arm, Patient Position: Sitting, Cuff Size: Large)   Pulse 84   Ht '5\' 5"'$  (1.651 m)   Wt 245 lb (111.1 kg)   LMP  (LMP Unknown)   Breastfeeding No   BMI 40.77 kg/m  UPT is negative. Skin warm and dry.Pelvic: external genitalia is normal in appearance no lesions, vagina: pink no cystocele,urethra has no lesions or masses noted, cervix:smooth and bulbous, uterus: normal size, shape and contour, non tender, no masses felt, adnexa: no masses or tenderness noted. Bladder is non tender and no masses felt.    Fall risk is low  Upstream - 09/22/22 1351       Pregnancy Intention Screening   Does the patient want to become pregnant in the next year? Yes    Does the patient's partner want to become pregnant in the next year? Yes    Would the patient like to discuss contraceptive options today? No      Contraception Wrap Up   Current Method Female Condom    End Method Female Condom             Examination chaperoned by Levy Pupa LPN  Assessment:     1. Pregnancy examination or test, negative result  2. Missed periods +missed periods UPT is negative Will rx provera 10 mg 1 daily for 10  days  Meds ordered this encounter  Medications   medroxyPROGESTERone (PROVERA) 10 MG tablet    Sig: Take 1 daily daily for 10 days    Dispense:  10 tablet    Refill:  0    Order Specific Question:   Supervising Provider    Answer:   Tania Ade H [[id number]]   oxybutynin (DITROPAN XL) 10 MG 24 hr tablet    Sig: Take 1 tablet (10 mg total) by mouth daily.    Dispense:  30 tablet    Refill:  2    Order Specific Question:   Supervising Provider    Answer:   Elonda Husky, LUTHER H [[id number]]     3. Mixed stress and urge urinary incontinence +UI will try ditropan XL 10 mg 1 daily  first, if persists will refer to urology     Plan:     Follow up in 3 weeks for ROS, if no periods then may check  labs

## 2022-09-28 ENCOUNTER — Encounter (HOSPITAL_COMMUNITY): Payer: Self-pay | Admitting: Psychiatry

## 2022-09-28 ENCOUNTER — Telehealth (INDEPENDENT_AMBULATORY_CARE_PROVIDER_SITE_OTHER): Payer: Medicaid Other | Admitting: Psychiatry

## 2022-09-28 DIAGNOSIS — F5081 Binge eating disorder: Secondary | ICD-10-CM

## 2022-09-28 DIAGNOSIS — Z9151 Personal history of suicidal behavior: Secondary | ICD-10-CM

## 2022-09-28 DIAGNOSIS — F332 Major depressive disorder, recurrent severe without psychotic features: Secondary | ICD-10-CM

## 2022-09-28 DIAGNOSIS — E559 Vitamin D deficiency, unspecified: Secondary | ICD-10-CM

## 2022-09-28 DIAGNOSIS — F4310 Post-traumatic stress disorder, unspecified: Secondary | ICD-10-CM | POA: Diagnosis not present

## 2022-09-28 DIAGNOSIS — F411 Generalized anxiety disorder: Secondary | ICD-10-CM

## 2022-09-28 DIAGNOSIS — F603 Borderline personality disorder: Secondary | ICD-10-CM

## 2022-09-28 DIAGNOSIS — F410 Panic disorder [episodic paroxysmal anxiety] without agoraphobia: Secondary | ICD-10-CM

## 2022-09-28 MED ORDER — SERTRALINE HCL 100 MG PO TABS: ORAL_TABLET | ORAL | 2 refills | Status: DC
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
## 2022-09-28 NOTE — Patient Instructions (Signed)
We started Zoloft at 50 mg (half a tablet) once daily today.  Once we see each other again next week, we will likely increase to a full tablet daily.  Keep up the good work with what you are doing in psychotherapy.

## 2022-09-29 ENCOUNTER — Encounter (HOSPITAL_COMMUNITY): Payer: Self-pay

## 2022-09-30 ENCOUNTER — Other Ambulatory Visit (HOSPITAL_COMMUNITY): Payer: Self-pay | Admitting: Psychiatry

## 2022-09-30 NOTE — Progress Notes (Signed)
Patient with jitteriness and vomiting on zoloft trial. Discontinued. May start celexa as most similar to previously effective lexapro but was discontinued due to sexual side effect.

## 2022-10-01 ENCOUNTER — Other Ambulatory Visit (HOSPITAL_COMMUNITY): Payer: Self-pay | Admitting: Psychiatry

## 2022-10-01 DIAGNOSIS — F40240 Claustrophobia: Secondary | ICD-10-CM

## 2022-10-01 DIAGNOSIS — F410 Panic disorder [episodic paroxysmal anxiety] without agoraphobia: Secondary | ICD-10-CM

## 2022-10-01 DIAGNOSIS — F4310 Post-traumatic stress disorder, unspecified: Secondary | ICD-10-CM

## 2022-10-01 DIAGNOSIS — F332 Major depressive disorder, recurrent severe without psychotic features: Secondary | ICD-10-CM

## 2022-10-01 DIAGNOSIS — F603 Borderline personality disorder: Secondary | ICD-10-CM

## 2022-10-01 MED ORDER — CITALOPRAM HYDROBROMIDE 10 MG PO TABS: 10.0000 mg | ORAL_TABLET | Freq: Every day | ORAL | 2 refills | Status: DC

## 2022-10-01 NOTE — Progress Notes (Signed)
Patient jittery with nausea/vomiting on zoloft. Given prior good response to lexapro (but sexual side effects) will trial celexa at lowest dose next.

## 2022-10-02 ENCOUNTER — Other Ambulatory Visit (HOSPITAL_COMMUNITY): Payer: Self-pay | Admitting: Psychiatry

## 2022-10-02 ENCOUNTER — Telehealth (HOSPITAL_COMMUNITY): Payer: Self-pay | Admitting: *Deleted

## 2022-10-02 DIAGNOSIS — F411 Generalized anxiety disorder: Secondary | ICD-10-CM

## 2022-10-02 DIAGNOSIS — F4310 Post-traumatic stress disorder, unspecified: Secondary | ICD-10-CM

## 2022-10-02 DIAGNOSIS — F410 Panic disorder [episodic paroxysmal anxiety] without agoraphobia: Secondary | ICD-10-CM

## 2022-10-02 DIAGNOSIS — F5104 Psychophysiologic insomnia: Secondary | ICD-10-CM

## 2022-10-02 DIAGNOSIS — F603 Borderline personality disorder: Secondary | ICD-10-CM

## 2022-10-02 DIAGNOSIS — F332 Major depressive disorder, recurrent severe without psychotic features: Secondary | ICD-10-CM

## 2022-10-02 MED ORDER — MIRTAZAPINE 7.5 MG PO TABS: ORAL_TABLET | ORAL | 0 refills | Status: DC

## 2022-10-02 NOTE — Telephone Encounter (Signed)
Per pt she is weird. Per pt she can not sleep and she can not sit still. Per pt she is not SI/HI. Per patient she have token Bendryl and melatonin and she still can not sleep. Per pt she was wondering if he could call her in something to sit still. Per pt she looked on the internet and was informed that stopping that med cold [place] is not recommended. Per pt she do not have no appetite. Per pt she hardly eaten anything yesterday. Per pt its like some withdrawal or something with the medication provider had her on.   The medication patient is talking about is Remeron.

## 2022-10-02 NOTE — Progress Notes (Signed)
Patient with rebound insomnia with discontinuation of low dose mirtazapine (15mg ) and previously discontinued due to akathisia. Will send in 7.5mg  tablets to be taken for 5 days then tapered to 3.75 (half a tablet) until out of medication (10 total tablets called in).

## 2022-10-05 ENCOUNTER — Telehealth (HOSPITAL_COMMUNITY): Payer: BC Managed Care – PPO | Admitting: Psychiatry

## 2022-10-05 ENCOUNTER — Ambulatory Visit (HOSPITAL_COMMUNITY)
Admission: EM | Admit: 2022-10-05 | Discharge: 2022-10-05 | Disposition: A | Discharge status: Home / Self Care | Payer: Medicaid Other | Attending: Registered Nurse | Admitting: Registered Nurse

## 2022-10-05 ENCOUNTER — Encounter (HOSPITAL_COMMUNITY): Payer: Self-pay | Admitting: Registered Nurse

## 2022-10-05 DIAGNOSIS — F3342 Major depressive disorder, recurrent, in full remission: Secondary | ICD-10-CM | POA: Diagnosis present

## 2022-10-05 DIAGNOSIS — F3341 Major depressive disorder, recurrent, in partial remission: Secondary | ICD-10-CM | POA: Diagnosis present

## 2022-10-05 DIAGNOSIS — F331 Major depressive disorder, recurrent, moderate: Secondary | ICD-10-CM | POA: Diagnosis present

## 2022-10-05 DIAGNOSIS — F330 Major depressive disorder, recurrent, mild: Secondary | ICD-10-CM | POA: Diagnosis present

## 2022-10-05 DIAGNOSIS — G2571 Drug induced akathisia: Secondary | ICD-10-CM

## 2022-10-05 DIAGNOSIS — F411 Generalized anxiety disorder: Secondary | ICD-10-CM

## 2022-10-05 DIAGNOSIS — F332 Major depressive disorder, recurrent severe without psychotic features: Secondary | ICD-10-CM

## 2022-10-05 HISTORY — DX: Drug induced akathisia: G25.71

## 2022-10-05 MED ORDER — PROPRANOLOL HCL 10 MG PO TABS
10.0000 mg | ORAL_TABLET | ORAL | Status: AC
  Administered 2022-10-05: 10 mg via ORAL
  Filled 2022-10-05: qty 1

## 2022-10-05 MED ORDER — PROPRANOLOL HCL 10 MG PO TABS: 10.0000 mg | ORAL_TABLET | Freq: Two times a day (BID) | ORAL | 0 refills | Status: DC

## 2022-10-05 NOTE — Discharge Instructions (Addendum)
Akathisia What Are the Symptoms?  A sense of restlessness and intense need to move. To relieve this feeling, you need to stay in motion. It usually affects your legs, usually while you're sitting. People with akathisia are likely to: Rock back and [employer] or march in place Shift their weight from foot to foot Cross and uncross their legs Squirm or fidget Grunt or moan  Treatment:  Beta-blockers like PROPRANOLOL: These blood pressure medicines are usually the first treatment that doctors prescribe for akathisia.  Medications discussed during visit: Started Propranolol 10 mg twice daily for akathisia. Will start the taper of Remeron 7.5 mg for one week and then 5 mg for one week. Will continue Celexa 10 mg daily. Will continue Abilify 5 mg daily. Will start/continue Melatonin for sleep.  Keep scheduled appointment with Dr. Debby Bud on 10/06/2022 at 8:00 AM for medication management.  If the Propranolol works let him know so that he can give you a prescription.

## 2022-10-05 NOTE — ED Triage Notes (Signed)
Pt presents to [facility] voluntarily accompanied by her mother seeking medication management. Pt states she was recently taken off of her Remeron 1 week ago because she started to have intrusive thoughts. Pt reports racing thoughts, anxiety, pacing, unstable sleeping patterns, decreased appetite. Pt reports she has an appointment with her psychiatrist tomorrow, but was recommended to come to this facility because her provider was off today. Pt denies SI/HI and AVH.

## 2022-10-05 NOTE — ED Notes (Signed)
Patient discharged with written and verbal instructions. No adverse reaction noted to Propranolol 10 mg po. Patient denies SI/HI and AVH at this time.

## 2022-10-06 ENCOUNTER — Encounter: Payer: Self-pay | Admitting: "Endocrinology

## 2022-10-06 ENCOUNTER — Telehealth (INDEPENDENT_AMBULATORY_CARE_PROVIDER_SITE_OTHER): Payer: Medicaid Other | Admitting: Psychiatry

## 2022-10-06 ENCOUNTER — Encounter (HOSPITAL_COMMUNITY): Payer: Self-pay | Admitting: Psychiatry

## 2022-10-06 DIAGNOSIS — F410 Panic disorder [episodic paroxysmal anxiety] without agoraphobia: Secondary | ICD-10-CM

## 2022-10-06 DIAGNOSIS — F4310 Post-traumatic stress disorder, unspecified: Secondary | ICD-10-CM | POA: Diagnosis not present

## 2022-10-06 DIAGNOSIS — Z9151 Personal history of suicidal behavior: Secondary | ICD-10-CM

## 2022-10-06 DIAGNOSIS — F332 Major depressive disorder, recurrent severe without psychotic features: Secondary | ICD-10-CM

## 2022-10-06 DIAGNOSIS — F603 Borderline personality disorder: Secondary | ICD-10-CM | POA: Diagnosis not present

## 2022-10-06 DIAGNOSIS — F5081 Binge eating disorder: Secondary | ICD-10-CM

## 2022-10-06 DIAGNOSIS — F411 Generalized anxiety disorder: Secondary | ICD-10-CM | POA: Diagnosis not present

## 2022-10-06 DIAGNOSIS — F40240 Claustrophobia: Secondary | ICD-10-CM

## 2022-10-06 DIAGNOSIS — F5104 Psychophysiologic insomnia: Secondary | ICD-10-CM

## 2022-10-06 MED ORDER — PROPRANOLOL HCL 10 MG PO TABS: 10.0000 mg | ORAL_TABLET | Freq: Three times a day (TID) | ORAL | 0 refills | Status: DC
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
## 2022-10-06 NOTE — Patient Instructions (Addendum)
We increased the propranolol to 10 mg 3 times a day and the anxiety and panic.  This medication is helping to do what you are able to do with your deep breathing exercises and sensory techniques leading to a physical calm.  Here is more information on the [facility] Perinatal Unit: [email or web address]  This is another article that you might find helpful in thinking about thoughts of harm towards her baby: https://www.ncbi.nlm.nih.gov/pmc/articles/PMC5519121/#:~:text=Intrusive%20thoughts%20or%20images%20of,harm%20their%20own%20infant%20intentionally.

## 2022-10-07 ENCOUNTER — Ambulatory Visit: Payer: BC Managed Care – PPO | Admitting: Nutrition

## 2022-10-11 ENCOUNTER — Other Ambulatory Visit (HOSPITAL_COMMUNITY): Payer: Self-pay | Admitting: Psychiatry

## 2022-10-11 DIAGNOSIS — F411 Generalized anxiety disorder: Secondary | ICD-10-CM

## 2022-10-11 DIAGNOSIS — F4310 Post-traumatic stress disorder, unspecified: Secondary | ICD-10-CM

## 2022-10-11 DIAGNOSIS — F332 Major depressive disorder, recurrent severe without psychotic features: Secondary | ICD-10-CM

## 2022-10-12 ENCOUNTER — Other Ambulatory Visit (HOSPITAL_COMMUNITY): Payer: Self-pay | Admitting: Psychiatry

## 2022-10-12 DIAGNOSIS — F410 Panic disorder [episodic paroxysmal anxiety] without agoraphobia: Secondary | ICD-10-CM

## 2022-10-12 DIAGNOSIS — F332 Major depressive disorder, recurrent severe without psychotic features: Secondary | ICD-10-CM

## 2022-10-12 DIAGNOSIS — F4310 Post-traumatic stress disorder, unspecified: Secondary | ICD-10-CM

## 2022-10-13 ENCOUNTER — Ambulatory Visit: Payer: Medicaid Other | Admitting: Adult Health

## 2022-10-13 ENCOUNTER — Telehealth (INDEPENDENT_AMBULATORY_CARE_PROVIDER_SITE_OTHER): Payer: Medicaid Other | Admitting: Psychiatry

## 2022-10-13 ENCOUNTER — Encounter (HOSPITAL_COMMUNITY): Payer: Self-pay | Admitting: Psychiatry

## 2022-10-13 DIAGNOSIS — Z79899 Other long term (current) drug therapy: Secondary | ICD-10-CM

## 2022-10-13 DIAGNOSIS — G2571 Drug induced akathisia: Secondary | ICD-10-CM

## 2022-10-13 DIAGNOSIS — F5081 Binge eating disorder: Secondary | ICD-10-CM

## 2022-10-13 DIAGNOSIS — F332 Major depressive disorder, recurrent severe without psychotic features: Secondary | ICD-10-CM | POA: Diagnosis not present

## 2022-10-13 DIAGNOSIS — F603 Borderline personality disorder: Secondary | ICD-10-CM

## 2022-10-13 DIAGNOSIS — F411 Generalized anxiety disorder: Secondary | ICD-10-CM

## 2022-10-13 DIAGNOSIS — F410 Panic disorder [episodic paroxysmal anxiety] without agoraphobia: Secondary | ICD-10-CM

## 2022-10-13 DIAGNOSIS — F4310 Post-traumatic stress disorder, unspecified: Secondary | ICD-10-CM | POA: Diagnosis not present

## 2022-10-13 DIAGNOSIS — E559 Vitamin D deficiency, unspecified: Secondary | ICD-10-CM

## 2022-10-13 HISTORY — DX: Other long term (current) drug therapy: Z79.899

## 2022-10-13 MED ORDER — ARIPIPRAZOLE 2 MG PO TABS: 2.0000 mg | ORAL_TABLET | Freq: Every day | ORAL | 0 refills | Status: DC
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
## 2022-10-13 NOTE — Patient Instructions (Signed)
We decreased the Abilify to 2 mg once daily.  The decreased dose will be for 1 week and then we will reassess but this should significantly help the akathisia that you are having.  If you are taking 50 mg of Benadryl 3 times a day that is a bit excessive and I recommend cutting the dose to 25 mg 3 times a day of the next step.  Please let me know if you are thinking of adding other medications to her regimen because this makes it difficult for me to determine the impact the medications I am prescribing is having on you.  Our goal is going to be to come down significantly off of several medications in the coming weeks.  If he finds that any of the dangerous thoughts you are having a worsening please let our office know or report directly to the emergency department.  The perinatal unit at [facility] is still an option.

## 2022-10-19 ENCOUNTER — Telehealth: Payer: Self-pay | Admitting: Family Medicine

## 2022-10-19 DIAGNOSIS — F410 Panic disorder [episodic paroxysmal anxiety] without agoraphobia: Secondary | ICD-10-CM

## 2022-10-19 DIAGNOSIS — F603 Borderline personality disorder: Secondary | ICD-10-CM

## 2022-10-19 DIAGNOSIS — F332 Major depressive disorder, recurrent severe without psychotic features: Secondary | ICD-10-CM

## 2022-10-19 NOTE — Telephone Encounter (Signed)
Seen psych due to meds not helping, suicidal.  She had 4 weeks of outpatient therapy.    Psych stopped medication and added medication. Husband is uncertain of their names. During this time pt started vomiting and having withdrawals.   Pt is very anxious, pacing, not sleeping. She has moved 2 hours away with her child to stay with her mom daily for help and risk of suicidal thoughts. Husband works here during the week and drives down to pt on the weekends.  When pt was having the withdrawals the psych provider was contacted and meds have been changed to Abilify and Citalopram.  Husband states that they would like to change psych providers. They feel that the current one always wants [facility] to [facility] and changes medications without weaning off, they cannot get in touch with the provider when needed or when they do have an appt it is over the phone and the appt is barely 66m.  Husband is also questioning if pt should be on Wellbutrin with her Citalopram. He was told this is the correct way for him to take and why does his wife not need to do the same.  Husband just wanted to make [facility] aware of all and wants to see another psych provider.

## 2022-10-19 NOTE — Telephone Encounter (Signed)
Pts husband has been informed. Reviewed medication list.  Mirtazapine- not sure of side effects but did not help, Zoloft- vomiting  Taking benadryl about three times per day for anxiousness  Verified appt in May with Dr. Lajuana Ripple.  Will call back if needed.

## 2022-10-19 NOTE — Telephone Encounter (Signed)
I have placed referral as requested.  However, I believe Dr Glenna Durand efforts/ recommendations to be appropriate.  Without going into detail, she has reason for the recommendation for inpatient evaluation and treatment.  These recommendations are surely to avoid the issues she is now dealing with on an outpatient setting (medication change needs/ withdrawal, etc).

## 2022-10-20 ENCOUNTER — Encounter (HOSPITAL_COMMUNITY): Payer: Self-pay

## 2022-10-20 ENCOUNTER — Telehealth (INDEPENDENT_AMBULATORY_CARE_PROVIDER_SITE_OTHER): Payer: Medicaid Other | Admitting: Psychiatry

## 2022-10-20 ENCOUNTER — Encounter (HOSPITAL_COMMUNITY): Payer: Self-pay | Admitting: Psychiatry

## 2022-10-20 DIAGNOSIS — F332 Major depressive disorder, recurrent severe without psychotic features: Secondary | ICD-10-CM | POA: Diagnosis not present

## 2022-10-20 DIAGNOSIS — F411 Generalized anxiety disorder: Secondary | ICD-10-CM | POA: Diagnosis not present

## 2022-10-20 DIAGNOSIS — F50814 Binge eating disorder, in remission: Secondary | ICD-10-CM

## 2022-10-20 DIAGNOSIS — F410 Panic disorder [episodic paroxysmal anxiety] without agoraphobia: Secondary | ICD-10-CM | POA: Diagnosis not present

## 2022-10-20 DIAGNOSIS — F5081 Binge eating disorder: Secondary | ICD-10-CM

## 2022-10-20 DIAGNOSIS — F603 Borderline personality disorder: Secondary | ICD-10-CM

## 2022-10-20 DIAGNOSIS — Z9151 Personal history of suicidal behavior: Secondary | ICD-10-CM

## 2022-10-20 DIAGNOSIS — F4310 Post-traumatic stress disorder, unspecified: Secondary | ICD-10-CM

## 2022-10-20 NOTE — Patient Instructions (Addendum)
We didn't make any medication changes today. If you want to reach out to your provider, I would look into Dialectical Behavioral Therapy (DBT) groups or individual therapy. We discussed seroquel, lamictal, and gabapentin. Of these three, lamictal could be a reasonable option. I would avoid looking on webmd or basic web searches (blogs) to look these up.

## 2022-10-20 NOTE — Progress Notes (Signed)
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 encounter; 1 of which was spent discussing care with patient's husband and safety planning with consideration of higher level of care.  Jacquelynn Cree, MD 10/20/2022, 5:09 PM

## 2022-10-23 ENCOUNTER — Encounter (HOSPITAL_COMMUNITY): Payer: Self-pay | Admitting: Psychiatry

## 2022-10-23 ENCOUNTER — Encounter (HOSPITAL_COMMUNITY): Payer: Self-pay

## 2022-10-23 ENCOUNTER — Other Ambulatory Visit (HOSPITAL_COMMUNITY): Payer: Self-pay | Admitting: Psychiatry

## 2022-10-23 ENCOUNTER — Telehealth (INDEPENDENT_AMBULATORY_CARE_PROVIDER_SITE_OTHER): Payer: Medicaid Other | Admitting: Psychiatry

## 2022-10-23 DIAGNOSIS — F332 Major depressive disorder, recurrent severe without psychotic features: Secondary | ICD-10-CM | POA: Diagnosis not present

## 2022-10-23 DIAGNOSIS — F603 Borderline personality disorder: Secondary | ICD-10-CM

## 2022-10-23 DIAGNOSIS — E559 Vitamin D deficiency, unspecified: Secondary | ICD-10-CM

## 2022-10-23 DIAGNOSIS — F411 Generalized anxiety disorder: Secondary | ICD-10-CM | POA: Diagnosis not present

## 2022-10-23 DIAGNOSIS — F5081 Binge eating disorder: Secondary | ICD-10-CM

## 2022-10-23 DIAGNOSIS — F4310 Post-traumatic stress disorder, unspecified: Secondary | ICD-10-CM | POA: Diagnosis not present

## 2022-10-23 DIAGNOSIS — G4700 Insomnia, unspecified: Secondary | ICD-10-CM | POA: Insufficient documentation

## 2022-10-23 DIAGNOSIS — F40240 Claustrophobia: Secondary | ICD-10-CM

## 2022-10-23 DIAGNOSIS — F410 Panic disorder [episodic paroxysmal anxiety] without agoraphobia: Secondary | ICD-10-CM

## 2022-10-23 DIAGNOSIS — F5104 Psychophysiologic insomnia: Secondary | ICD-10-CM | POA: Insufficient documentation

## 2022-10-23 HISTORY — DX: Insomnia, unspecified: G47.00

## 2022-10-23 MED ORDER — GABAPENTIN 100 MG PO CAPS
100.0000 mg | ORAL_CAPSULE | Freq: Three times a day (TID) | ORAL | 0 refills | Status: DC | PRN
Start: 2022-10-23 — End: 2022-11-06

## 2022-10-23 NOTE — Progress Notes (Signed)
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 the symptoms worsen or if the condition fails to improve as anticipated.  I provided 60 minutes of non-face-to-face time during this encounter; 45 of which was spent discussing care with patient's husband and safety planning with consideration of higher level of care.  Elsie Lincoln, MD 10/23/2022, 11:01 AM

## 2022-10-23 NOTE — Patient Instructions (Signed)
We added gabapentin 100 mg 3 times a day as you need for panic attacks or insomnia.  Do try to only take this on an as needed basis as opposed to scheduling it 3 times a day.  I will reach out to our partial stabilization/intensive outpatient program 3 to get going with that again.  When he finished that I would still recommend a DBT (medical behavioral therapy) group for therapy.

## 2022-10-26 ENCOUNTER — Ambulatory Visit: Payer: BC Managed Care – PPO | Admitting: "Endocrinology

## 2022-10-28 ENCOUNTER — Other Ambulatory Visit (HOSPITAL_COMMUNITY): Payer: Self-pay | Admitting: Psychiatry

## 2022-10-28 ENCOUNTER — Other Ambulatory Visit: Payer: Medicaid Other

## 2022-10-28 DIAGNOSIS — F410 Panic disorder [episodic paroxysmal anxiety] without agoraphobia: Secondary | ICD-10-CM

## 2022-10-28 DIAGNOSIS — F332 Major depressive disorder, recurrent severe without psychotic features: Secondary | ICD-10-CM

## 2022-10-29 ENCOUNTER — Encounter (HOSPITAL_COMMUNITY): Payer: Self-pay

## 2022-10-29 LAB — CBC
Hematocrit: 37.7 % (ref 34.0–46.6)
Hemoglobin: 12.5 g/dL (ref 11.1–15.9)
MCH: 29.6 pg (ref 26.6–33.0)
MCHC: 33.2 g/dL (ref 31.5–35.7)
MCV: 89 fL (ref 79–97)
Platelets: 346 10*3/uL (ref 150–450)
RBC: 4.22 x10E6/uL (ref 3.77–5.28)
RDW: 13.4 % (ref 11.7–15.4)
WBC: 5.6 10*3/uL (ref 3.4–10.8)

## 2022-10-29 LAB — VITAMIN B12: Vitamin B-12: 297 pg/mL (ref 232–1245)

## 2022-10-29 LAB — IRON,TIBC AND FERRITIN PANEL
Ferritin: 16 ng/mL (ref 15–150)
Iron Saturation: 10 % — ABNORMAL LOW (ref 15–55)
Iron: 37 ug/dL (ref 27–159)
Total Iron Binding Capacity: 371 ug/dL (ref 250–450)
UIBC: 334 ug/dL (ref 131–425)

## 2022-10-29 LAB — VITAMIN D 25 HYDROXY (VIT D DEFICIENCY, FRACTURES): Vit D, 25-Hydroxy: 30.6 ng/mL (ref 30.0–100.0)

## 2022-10-29 NOTE — Telephone Encounter (Signed)
noted 

## 2022-11-02 ENCOUNTER — Encounter (HOSPITAL_COMMUNITY): Payer: Self-pay

## 2022-11-03 ENCOUNTER — Telehealth (HOSPITAL_COMMUNITY): Payer: BC Managed Care – PPO | Admitting: Psychiatry

## 2022-11-06 ENCOUNTER — Telehealth (INDEPENDENT_AMBULATORY_CARE_PROVIDER_SITE_OTHER): Payer: Medicaid Other | Admitting: Psychiatry

## 2022-11-06 ENCOUNTER — Encounter (HOSPITAL_COMMUNITY): Payer: Self-pay | Admitting: Psychiatry

## 2022-11-06 DIAGNOSIS — Z79899 Other long term (current) drug therapy: Secondary | ICD-10-CM

## 2022-11-06 DIAGNOSIS — F410 Panic disorder [episodic paroxysmal anxiety] without agoraphobia: Secondary | ICD-10-CM

## 2022-11-06 DIAGNOSIS — F332 Major depressive disorder, recurrent severe without psychotic features: Secondary | ICD-10-CM

## 2022-11-06 DIAGNOSIS — F40240 Claustrophobia: Secondary | ICD-10-CM

## 2022-11-06 DIAGNOSIS — F4310 Post-traumatic stress disorder, unspecified: Secondary | ICD-10-CM | POA: Diagnosis not present

## 2022-11-06 DIAGNOSIS — E559 Vitamin D deficiency, unspecified: Secondary | ICD-10-CM

## 2022-11-06 DIAGNOSIS — F5081 Binge eating disorder: Secondary | ICD-10-CM

## 2022-11-06 DIAGNOSIS — F411 Generalized anxiety disorder: Secondary | ICD-10-CM

## 2022-11-06 DIAGNOSIS — F603 Borderline personality disorder: Secondary | ICD-10-CM | POA: Diagnosis not present

## 2022-11-06 DIAGNOSIS — F5104 Psychophysiologic insomnia: Secondary | ICD-10-CM

## 2022-11-06 MED ORDER — PROPRANOLOL HCL 10 MG PO TABS
ORAL_TABLET | ORAL | 0 refills | Status: DC
Start: 2022-11-06 — End: 2022-11-23

## 2022-11-06 MED ORDER — LAMOTRIGINE 25 MG PO TABS
25.0000 mg | ORAL_TABLET | Freq: Every day | ORAL | 1 refills | Status: DC
Start: 2022-11-13 — End: 2022-11-23

## 2022-11-06 MED ORDER — CITALOPRAM HYDROBROMIDE 20 MG PO TABS
20.0000 mg | ORAL_TABLET | Freq: Every day | ORAL | 2 refills | Status: DC
Start: 2022-11-06 — End: 2022-11-23

## 2022-11-06 NOTE — Progress Notes (Signed)
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 or 5 should not be populated when Q2 is No       Collaboration of Care: Collaboration of Care: Medication Management AEB as above, Primary Care Provider AEB as above, and Referral or follow-up with counselor/therapist AEB as above  Patient/Guardian was advised Release of Information must be obtained prior to any record release in order to collaborate their care with an outside provider. Patient/Guardian was advised if they have not already done so to contact the registration department to sign all necessary forms in order for [place] to release information regarding their care.    Consent: Patient/Guardian gives verbal consent for treatment and assignment of benefits for services provided during this visit. Patient/Guardian expressed understanding and agreed to proceed.   Televisit via video: I connected with Abby on 11/06/22 at 10:00 AM EDT by a video enabled telemedicine application and verified that I am speaking with the correct person using two identifiers.  Location: Patient: At her mother's home in [place] Provider: Home office   I discussed the limitations of evaluation and management by telemedicine and the availability of in person appointments. The patient expressed understanding and agreed to proceed.  I discussed the assessment and treatment plan with the patient. The patient was provided an opportunity to ask questions and all were answered. The patient agreed with the plan and demonstrated an understanding of the instructions.   The patient was advised to call back or seek an in-person evaluation if the symptoms worsen or if the condition fails to improve as anticipated.  I provided 60 minutes of non-face-to-face time during this encounter; 45 of which was spent discussing care with patient's mother and safety planning with consideration of higher level of care.  Elsie Lincoln, MD 11/06/2022, 12:21 PM

## 2022-11-06 NOTE — Patient Instructions (Addendum)
We discontinued the gabapentin since it was not having the effect we desired.  Change her propranolol to 20 mg at midday and you can use 10 mg as needed for panic.  We also titrated the Celexa to 20 mg once daily.  In 1 week the lamotrigine (Lamictal) 25 mg once daily should be available at your pharmacy.  I provided this resource before but I will copy and paste again to here: https://www.mosley.[email or web address]  This is the DBT skills workbook and I think would go a long way and keeping Abby preoccupied during the day and giving her skills to use.  Here to modify deep breathing technique that you can practice: https://www.nhs.uk/mental-health/self-help/guides-tools-and-activities/breathing-exercises-for-stress/  And this is a worksheet that outlines what I have been doing with Abby in sessions and challenging her negative cognitions that she has: https://www.therapistaid.com/therapy-worksheet/challenging-negative-thoughts

## 2022-11-09 ENCOUNTER — Encounter (HOSPITAL_COMMUNITY): Payer: Self-pay

## 2022-11-12 ENCOUNTER — Encounter: Payer: Self-pay | Admitting: "Endocrinology

## 2022-11-17 ENCOUNTER — Other Ambulatory Visit: Payer: Self-pay | Admitting: Family Medicine

## 2022-11-17 DIAGNOSIS — E559 Vitamin D deficiency, unspecified: Secondary | ICD-10-CM

## 2022-11-17 NOTE — Telephone Encounter (Signed)
LMOVM for pt to start OTC Vit D 400 u daily

## 2022-11-17 NOTE — Telephone Encounter (Signed)
Please have pharmacy note SIG: "... X12 weeks. THEN START OVER THE COUNTER VIT D 400 UNITS DAILY ".  Should be starting OTC Vit D

## 2022-11-23 ENCOUNTER — Encounter (HOSPITAL_COMMUNITY): Payer: Self-pay | Admitting: Psychiatry

## 2022-11-23 ENCOUNTER — Telehealth (INDEPENDENT_AMBULATORY_CARE_PROVIDER_SITE_OTHER): Payer: Medicaid Other | Admitting: Psychiatry

## 2022-11-23 DIAGNOSIS — F411 Generalized anxiety disorder: Secondary | ICD-10-CM | POA: Diagnosis not present

## 2022-11-23 DIAGNOSIS — F332 Major depressive disorder, recurrent severe without psychotic features: Secondary | ICD-10-CM | POA: Diagnosis not present

## 2022-11-23 DIAGNOSIS — F603 Borderline personality disorder: Secondary | ICD-10-CM

## 2022-11-23 DIAGNOSIS — F4310 Post-traumatic stress disorder, unspecified: Secondary | ICD-10-CM

## 2022-11-23 DIAGNOSIS — F410 Panic disorder [episodic paroxysmal anxiety] without agoraphobia: Secondary | ICD-10-CM

## 2022-11-23 DIAGNOSIS — F40240 Claustrophobia: Secondary | ICD-10-CM

## 2022-11-23 MED ORDER — PROPRANOLOL HCL 10 MG PO TABS
ORAL_TABLET | ORAL | 0 refills | Status: DC
Start: 2022-11-23 — End: 2022-12-18

## 2022-11-23 MED ORDER — CITALOPRAM HYDROBROMIDE 40 MG PO TABS
40.0000 mg | ORAL_TABLET | Freq: Every day | ORAL | 2 refills | Status: DC
Start: 2022-11-23 — End: 2022-12-16

## 2022-11-23 NOTE — Patient Instructions (Signed)
We increased the Celexa (citalopram) to 40 mg once daily today.  You may notice a brief worsening of anxiety with the dose increase but should go away after a few days.  We will try to give it 4 weeks at this dose to get a sense of the benefits that he will gain from it.  Do call your insurer to find a network provider who practices DBT.  You can also check www.psychologytoday.com which is a pretty effective search engine.  Keep up the good work with practicing the DBT on your own.
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
## 2022-11-24 ENCOUNTER — Other Ambulatory Visit: Payer: Self-pay | Admitting: Family Medicine

## 2022-11-24 DIAGNOSIS — R5382 Chronic fatigue, unspecified: Secondary | ICD-10-CM

## 2022-11-30 ENCOUNTER — Ambulatory Visit (INDEPENDENT_AMBULATORY_CARE_PROVIDER_SITE_OTHER): Payer: Medicaid Other | Admitting: Family Medicine

## 2022-11-30 ENCOUNTER — Encounter: Payer: Self-pay | Admitting: Family Medicine

## 2022-11-30 VITALS — BP 107/70 | HR 60 | Temp 98.6°F | Ht 65.0 in | Wt 243.0 lb

## 2022-11-30 DIAGNOSIS — Z6841 Body Mass Index (BMI) 40.0 and over, adult: Secondary | ICD-10-CM

## 2022-11-30 DIAGNOSIS — E063 Autoimmune thyroiditis: Secondary | ICD-10-CM

## 2022-11-30 DIAGNOSIS — L659 Nonscarring hair loss, unspecified: Secondary | ICD-10-CM | POA: Diagnosis not present

## 2022-11-30 NOTE — Progress Notes (Signed)
Subjective: CC: Hair thinning, weight gain PCP: Raliegh Ip, DO ZOX:[id number] Marcia Gonzalez is a 27 y.o. female presenting to clinic today for:  1.  Hashimoto's Patient reports that she has been having some hair thinning over the last couple of months as well as ongoing weight gain despite exercise and eating well.  She does eat some carbs but has eliminated unnecessary sugars and decrease portion sizes yet she continues to gain weight.  She reports constipation.  Her mental health has improved quite a bit on the medications that are being prescribed by her psychiatrist.  She drinks sodas but they are always sugar-free.  Red meat is essentially nonexistent due to her husband's red meat allergy.  She had a menstrual cycle less than 2 weeks ago but admits that these have been not very regular either.  In fact her OB/GYN had to give her something to stimulate her menstrual cycle.   ROS: Per HPI  Allergies  Allergen Reactions   Codeine Nausea Only   Amoxicillin-Pot Clavulanate Other (See Comments)    Hallucinations   Latex Other (See Comments)    Gets Blisters   Misoprostol     Left blisters in mouth   Oxycodone Hives and Itching    Tylenol 3   Pollen Extract Other (See Comments)   Other Rash    Latex tape, causes reaction and blisters.   Past Medical History:  Diagnosis Date   ADHD (attention deficit hyperactivity disorder)    Akathisia 10/05/2022   Allergy    Anemia    Anxiety    Arthritis    Asthma    Chronic hypertension affecting pregnancy 12/03/2021   Dx 12/03/2021 (by today's BP + Joseph Pierini values); rx Labetalol 200mg  bid; growth q 4wks; 2x/wk testing @ 32wks; IOL 37-39wk   Constipation 08/14/2022   Depression    Encounter for supervision of high risk pregnancy in second trimester, antepartum 10/21/2021         FAMILY TREE     RESULTS  Language  English  Pap  (needs postpartum)  Initiated care at  13wks  GC/CT  Initial:  -/-          36wks:  Dating by  6wk [place]        Support  person     Genetics  NT/IT: neg/neg    AFP:      Panorama: low risk female  BP cuff  rx'd  Carrier Screen  Neg 05/01/21        [place]/Hgb Elec     Rhogam  n/a        TDaP vaccine  02/11/22  Blood Type  O/Positive/-- (04/04 1619)  Flu v   Endometriosis    GERD (gastroesophageal reflux disease)    History of borderline personality disorder    Hypertension    gestational   Hypothyroidism    IIH (idiopathic intracranial hypertension)    Missed periods 09/22/2022   Polypharmacy 10/13/2022   Pregnancy examination or test, negative result 09/22/2022   Pregnancy induced hypertension    PTSD (post-traumatic stress disorder)    Suicidal ideation 08/07/2022   UTI (urinary tract infection)    Vaginal delivery 04/05/2022   Vitamin D deficiency 09/08/2022   Wells' syndrome     Current Outpatient Medications:    citalopram (CELEXA) 40 MG tablet, Take 1 tablet (40 mg total) by mouth daily., Disp: 30 tablet, Rfl: 2   pantoprazole (PROTONIX) 40 MG tablet, Take 1 tablet (40 mg total) by mouth daily as  needed., Disp: 90 tablet, Rfl: 3   propranolol (INDERAL) 10 MG tablet, Take 20 mg at midday and you can use 10 mg as needed for panic, Disp: 90 tablet, Rfl: 0   SYNTHROID 88 MCG tablet, Take 1 tablet (88 mcg total) by mouth daily before breakfast., Disp: 90 tablet, Rfl: 3 Social History   Socioeconomic History   Marital status: Married    Spouse name: Jeri Modena   Number of children: 1   Years of education: 12   Highest education level: High school graduate  Occupational History   Not on file  Tobacco Use   Smoking status: Never    Passive exposure: Never   Smokeless tobacco: Never  Vaping Use   Vaping Use: Never used  Substance and Sexual Activity   Alcohol use: Not Currently   Drug use: Never   Sexual activity: Yes    Birth control/protection: None, Condom  Other Topics Concern   Not on file  Social History Narrative   Not on file   Social Determinants of Health   Financial Resource Strain:  Low Risk  (04/13/2022)   Overall Financial Resource Strain (CARDIA)    Difficulty of Paying Living Expenses: Not very hard  Food Insecurity: No Food Insecurity (04/13/2022)   Hunger Vital Sign    Worried About Running Out of Food in the Last Year: Never true    Ran Out of Food in the Last Year: Never true  Transportation Needs: No Transportation Needs (04/13/2022)   PRAPARE - Administrator, Civil Service (Medical): No    Lack of Transportation (Non-Medical): No  Physical Activity: Insufficiently Active (04/13/2022)   Exercise Vital Sign    Days of Exercise per Week: 1 day    Minutes of Exercise per Session: 10 min  Stress: No Stress Concern Present (04/13/2022)   [employer] of Occupational Health - Occupational Stress Questionnaire    Feeling of Stress : Only a little  Social Connections: Socially Integrated (04/13/2022)   Social Connection and Isolation Panel [NHANES]    Frequency of Communication with Friends and Family: More than three times a week    Frequency of Social Gatherings with Friends and Family: Once a week    Attends Religious Services: More than 4 times per year    Active Member of [employer] or Organizations: Yes    Attends Engineer, structural: More than 4 times per year    Marital Status: Married  Catering manager Violence: Not At Risk (04/13/2022)   Humiliation, Afraid, Rape, and Kick questionnaire    Fear of Current or Ex-Partner: No    Emotionally Abused: No    Physically Abused: No    Sexually Abused: No   Family History  Problem Relation Age of Onset   COPD Maternal Grandmother    Arthritis Maternal Grandmother    Depression Father    Anxiety disorder Father    Kidney disease Mother    Hypertension Mother    Hyperlipidemia Mother    Depression Mother    Cancer Mother        cervical   Arthritis Mother    Anxiety disorder Mother    Asthma Brother    Vesicoureteral reflux Son     Objective: Office vital signs reviewed. BP  107/70   Pulse 60   Temp 98.6 F (37 C)   Ht 5\' 5"  (1.651 m)   Wt 243 lb (110.2 kg)   LMP 10/28/2022   SpO2 (!) 7%   BMI  40.44 kg/m   Physical Examination:  General: Awake, alert, morbidly obese, No acute distress HEENT: Sclera white.  No goiter.  No exophthalmos.  Some hair thinning noted along the temporal areas bilaterally Cardio: regular rate and rhythm, S1S2 heard, no murmurs appreciated Pulm: clear to auscultation bilaterally, no wheezes, rhonchi or rales; normal work of breathing on room air Neuro: No tremor  Assessment/ Plan: 27 y.o. female   Hair loss - Plan: TSH, T4, Free, Iron, TIBC and Ferritin Panel  Hashimoto's thyroiditis - Plan: TSH, T4, Free  Morbid obesity (HCC) - Plan: Insulin, random  Will check thyroid levels given reports of hair loss, constipation and ongoing weight gain.  Will also check iron levels.  We discussed that unfortunately Medicaid does not cover many of the medications that are available for weight loss so her options are limited.  I hesitate to use any type of stimulant medications like phentermine in this patient due to risk of exacerbating anxiety and other mental health issues.  I think she would be a great candidate for GLP if this was available to her and perhaps we might be able to revisit that next year.  If I do find that her insulin level is high, we could consider something like metformin to help regulate her a little better.  I also gave her information on healthy weight and wellness.  No orders of the defined types were placed in this encounter.  No orders of the defined types were placed in this encounter.    Raliegh Ip, DO [facility] [phone]

## 2022-11-30 NOTE — Patient Instructions (Signed)
We will look at your insulin level and maybe start a medication called Metformin.  This can help regulate metabolism of sugar, and help with weight goals. Unfortunately, Medicaid does not cover weight loss medications at this time. You should consider seeing [facility] in [place].  I have given you info on this We will look for metabolic reasons your hair is thinning

## 2022-12-01 ENCOUNTER — Ambulatory Visit: Payer: BC Managed Care – PPO | Admitting: Family Medicine

## 2022-12-01 LAB — IRON,TIBC AND FERRITIN PANEL
Ferritin: 23 ng/mL (ref 15–150)
Iron Saturation: 13 % — ABNORMAL LOW (ref 15–55)
Iron: 42 ug/dL (ref 27–159)
Total Iron Binding Capacity: 323 ug/dL (ref 250–450)
UIBC: 281 ug/dL (ref 131–425)

## 2022-12-01 LAB — T4, FREE: Free T4: 1.63 ng/dL (ref 0.82–1.77)

## 2022-12-01 LAB — INSULIN, RANDOM: INSULIN: 17.3 u[IU]/mL (ref 2.6–24.9)

## 2022-12-01 LAB — TSH: TSH: 2.28 u[IU]/mL (ref 0.450–4.500)

## 2022-12-15 ENCOUNTER — Other Ambulatory Visit (HOSPITAL_COMMUNITY): Payer: Self-pay | Admitting: Psychiatry

## 2022-12-15 DIAGNOSIS — F40240 Claustrophobia: Secondary | ICD-10-CM

## 2022-12-15 DIAGNOSIS — F332 Major depressive disorder, recurrent severe without psychotic features: Secondary | ICD-10-CM

## 2022-12-15 DIAGNOSIS — F4310 Post-traumatic stress disorder, unspecified: Secondary | ICD-10-CM

## 2022-12-15 DIAGNOSIS — F603 Borderline personality disorder: Secondary | ICD-10-CM

## 2022-12-15 DIAGNOSIS — F410 Panic disorder [episodic paroxysmal anxiety] without agoraphobia: Secondary | ICD-10-CM

## 2022-12-16 ENCOUNTER — Ambulatory Visit (INDEPENDENT_AMBULATORY_CARE_PROVIDER_SITE_OTHER): Payer: Medicaid Other | Admitting: Internal Medicine

## 2022-12-16 ENCOUNTER — Encounter (INDEPENDENT_AMBULATORY_CARE_PROVIDER_SITE_OTHER): Payer: Self-pay | Admitting: Internal Medicine

## 2022-12-16 VITALS — BP 109/75 | HR 68 | Temp 98.1°F | Ht 65.0 in | Wt 237.0 lb

## 2022-12-16 DIAGNOSIS — E782 Mixed hyperlipidemia: Secondary | ICD-10-CM

## 2022-12-16 DIAGNOSIS — Z6839 Body mass index (BMI) 39.0-39.9, adult: Secondary | ICD-10-CM

## 2022-12-16 DIAGNOSIS — Z0289 Encounter for other administrative examinations: Secondary | ICD-10-CM

## 2022-12-16 NOTE — Progress Notes (Signed)
Office: [phone]  /  Fax: [phone]   Initial Visit  Marcia Gonzalez was seen in clinic today to evaluate for obesity. She is interested in losing weight to improve overall health and reduce the risk of weight related complications. She presents today to review program treatment options, initial physical assessment, and evaluation.     She was referred by: PCP  When asked what else they would like to accomplish? She states: Improve quality of life and Improve self-confidence  Weight history: Weight problem under age of 55  When asked how has your weight affected you? She states: Has affected self-esteem, Contributed to medical problems, Problems with eating patterns, and Has affected mood   Some associated conditions: Hyperlipidemia and Vitamin D Deficiency  Contributing factors: Family history, Disruption of circadian rhythm, Stress, Reduced physical activity, Eating patterns, Mental health problems, and Pregnancy  Weight promoting medications identified: Beta-blockers  Current nutrition plan: Portion control / smart choices and Other: reduced processed foods  Current level of physical activity: None  Current or previous pharmacotherapy: None and Is interested in pharmacotherapy  Response to medication: Never tried medications   Past medical history includes:   Past Medical History:  Diagnosis Date   ADHD (attention deficit hyperactivity disorder)    Akathisia 10/05/2022   Allergy    Anemia    Anxiety    Arthritis    Asthma    Chronic hypertension affecting pregnancy 12/03/2021   Dx 12/03/2021 (by today's BP + Joseph Pierini values); rx Labetalol 200mg  bid; growth q 4wks; 2x/wk testing @ 32wks; IOL 37-39wk   Constipation 08/14/2022   Depression    Encounter for supervision of high risk pregnancy in second trimester, antepartum 10/21/2021         FAMILY TREE     RESULTS  Language  English  Pap  (needs postpartum)  Initiated care at  13wks  GC/CT  Initial:  -/-           36wks:  Dating by  6wk [place]        Support person     Genetics  NT/IT: neg/neg    AFP:      Panorama: low risk female  BP cuff  rx'd  Carrier Screen  Neg 05/01/21        [place]/Hgb Elec     Rhogam  n/a        TDaP vaccine  02/11/22  Blood Type  O/Positive/-- (04/04 1619)  Flu v   Endometriosis    GERD (gastroesophageal reflux disease)    History of borderline personality disorder    Hypertension    gestational   Hypothyroidism    IIH (idiopathic intracranial hypertension)    Missed periods 09/22/2022   Polypharmacy 10/13/2022   Pregnancy examination or test, negative result 09/22/2022   Pregnancy induced hypertension    PTSD (post-traumatic stress disorder)    Suicidal ideation 08/07/2022   UTI (urinary tract infection)    Vaginal delivery 04/05/2022   Vitamin D deficiency 09/08/2022   Wells' syndrome      Objective:   BP 109/75   Pulse 68   Temp 98.1 F (36.7 C)   Ht 5\' 5"  (1.651 m)   Wt 237 lb (107.5 kg)   LMP 10/28/2022   SpO2 97%   BMI 39.44 kg/m  She was weighed on the bioimpedance scale: Body mass index is 39.44 kg/m.  Peak Weight:240 , Body Fat%: 48, Visceral Fat Rating: 11, Weight trend over the last 12 months: Increased  General:  Alert, oriented and cooperative. Patient is in no acute distress.  Respiratory: Normal respiratory effort, no problems with respiration noted   Gait: able to ambulate independently  Mental Status: Normal mood and affect. Normal behavior. Normal judgment and thought content.   DIAGNOSTIC DATA REVIEWED:  BMET    Component Value Date/Time   NA 139 07/29/2022 1517   K 4.3 07/29/2022 1517   CL 103 07/29/2022 1517   CO2 24 07/29/2022 1517   GLUCOSE 84 07/29/2022 1517   GLUCOSE 108 (H) 04/03/2022 0751   BUN 11 07/29/2022 1517   CREATININE 0.73 07/29/2022 1517   CALCIUM 9.3 07/29/2022 1517   GFRNONAA >60 04/03/2022 0751   No results found for: "HGBA1C" Lab Results  Component Value Date   INSULIN 17.3 11/30/2022   CBC    Component  Value Date/Time   WBC 5.6 10/28/2022 1336   WBC 9.6 04/05/2022 0450   RBC 4.22 10/28/2022 1336   RBC 2.98 (L) 04/05/2022 0450   HGB 12.5 10/28/2022 1336   HCT 37.7 10/28/2022 1336   PLT 346 10/28/2022 1336   MCV 89 10/28/2022 1336   MCH 29.6 10/28/2022 1336   MCH 31.2 04/05/2022 0450   MCHC 33.2 10/28/2022 1336   MCHC 35.4 04/05/2022 0450   RDW 13.4 10/28/2022 1336   Iron/TIBC/Ferritin/ %Sat    Component Value Date/Time   IRON 42 11/30/2022 0842   TIBC 323 11/30/2022 0842   FERRITIN 23 11/30/2022 0842   IRONPCTSAT 13 (L) 11/30/2022 0842   Lipid Panel     Component Value Date/Time   CHOL 220 (H) 06/30/2021 1544   TRIG 160 (H) 06/30/2021 1544   HDL 44 06/30/2021 1544   CHOLHDL 5.0 (H) 06/30/2021 1544   LDLCALC 147 (H) 06/30/2021 1544   Hepatic Function Panel     Component Value Date/Time   PROT 6.5 07/29/2022 1517   ALBUMIN 4.1 07/29/2022 1517   AST 16 07/29/2022 1517   ALT 12 07/29/2022 1517   ALKPHOS 80 07/29/2022 1517   BILITOT 0.2 07/29/2022 1517      Component Value Date/Time   TSH 2.280 11/30/2022 0842     Assessment and Plan:   Mixed hyperlipidemia Assessment & Plan: LDL is at goal. Elevated LDL may be secondary to nutrition, genetics and spillover effect from excess adiposity. Recommended LDL goal is <70 to reduce the risk of fatty streaks and the progression to obstructive ASCVD in the future.   Lab Results  Component Value Date   CHOL 220 (H) 06/30/2021   HDL 44 06/30/2021   LDLCALC 147 (H) 06/30/2021   TRIG 160 (H) 06/30/2021   CHOLHDL 5.0 (H) 06/30/2021    We will repeat lipid panel in a fasting state if she decides to enroll in our program.  We will also guide nutritional therapy based on her history.  She will also be screened for glycemic disorders including a fasting blood glucose and hemoglobin A1c.  Most recent insulin level shows a relative increase but we do not have a paired fasting blood glucose to interpret.      Class 2  severe obesity with serious comorbidity and body mass index (BMI) of 39.0 to 39.9 in adult, unspecified obesity type [facility]) Assessment & Plan: According to records she has a history of binge eating type of disorder.  She acknowledges having strong hunger signals and impaired satiety.  She is on SSRI she inquires about hypercortisolism testing.  We reviewed weight, biometrics, associated medical conditions and contributing factors with patient.  She would benefit from weight loss therapy via a modified calorie, low-carb, high-protein nutritional plan tailored to their REE (resting energy expenditure) which will be determined by indirect calorimetry.  We will also assess for cardiometabolic risk and nutritional derangements via fasting serologies at her next appointment.         Obesity Treatment / Action Plan:  Patient will work on garnering support from family and friends to begin weight loss journey. Will work on eliminating or reducing the presence of highly palatable, calorie dense foods in the home. Will complete provided nutritional and psychosocial assessment questionnaire before the next appointment. Will be scheduled for indirect calorimetry to determine resting energy expenditure in a fasting state.  This will allow [place] to create a reduced calorie, high-protein meal plan to promote loss of fat mass while preserving muscle mass. Counseled on the health benefits of losing 5%-15% of total body weight. Was counseled on nutritional approaches to weight loss and benefits of reducing processed foods and consuming plant-based foods and high quality protein as part of nutritional weight management. Was counseled on pharmacotherapy and role as an adjunct in weight management.   Obesity Education Performed Today:  She was weighed on the bioimpedance scale and results were discussed and documented in the synopsis.  We discussed obesity as a disease and the importance of a more detailed evaluation  of all the factors contributing to the disease.  We discussed the importance of long term lifestyle changes which include nutrition, exercise and behavioral modifications as well as the importance of customizing this to her specific health and social needs.  We discussed the benefits of reaching a healthier weight to alleviate the symptoms of existing conditions and reduce the risks of the biomechanical, metabolic and psychological effects of obesity.  Tyera Eatherly appears to be in the action stage of change and states they are ready to start intensive lifestyle modifications and behavioral modifications.  30 minutes was spent today on this visit including the above counseling, pre-visit chart review, and post-visit documentation.  Reviewed by clinician on day of visit: allergies, medications, problem list, medical history, surgical history, family history, social history, and previous encounter notes pertinent to obesity diagnosis.   Worthy Rancher, MD

## 2022-12-16 NOTE — Assessment & Plan Note (Addendum)
LDL is at goal. Elevated LDL may be secondary to nutrition, genetics and spillover effect from excess adiposity. Recommended LDL goal is <70 to reduce the risk of fatty streaks and the progression to obstructive ASCVD in the future.   Lab Results  Component Value Date   CHOL 220 (H) 06/30/2021   HDL 44 06/30/2021   LDLCALC 147 (H) 06/30/2021   TRIG 160 (H) 06/30/2021   CHOLHDL 5.0 (H) 06/30/2021    We will repeat lipid panel in a fasting state if she decides to enroll in our program.  We will also guide nutritional therapy based on her history.  She will also be screened for glycemic disorders including a fasting blood glucose and hemoglobin A1c.  Most recent insulin level shows a relative increase but we do not have a paired fasting blood glucose to interpret.

## 2022-12-16 NOTE — Assessment & Plan Note (Addendum)
According to records she has a history of binge eating type of disorder.  She acknowledges having strong hunger signals and impaired satiety.  She is on SSRI she inquires about hypercortisolism testing.  We reviewed weight, biometrics, associated medical conditions and contributing factors with patient. She would benefit from weight loss therapy via a modified calorie, low-carb, high-protein nutritional plan tailored to their REE (resting energy expenditure) which will be determined by indirect calorimetry.  We will also assess for cardiometabolic risk and nutritional derangements via fasting serologies at her next appointment.

## 2022-12-17 ENCOUNTER — Encounter (HOSPITAL_COMMUNITY): Payer: Self-pay

## 2022-12-17 ENCOUNTER — Other Ambulatory Visit (HOSPITAL_COMMUNITY): Payer: Self-pay | Admitting: Psychiatry

## 2022-12-17 DIAGNOSIS — F410 Panic disorder [episodic paroxysmal anxiety] without agoraphobia: Secondary | ICD-10-CM

## 2022-12-18 ENCOUNTER — Other Ambulatory Visit: Payer: Self-pay | Admitting: Adult Health

## 2022-12-22 ENCOUNTER — Encounter (HOSPITAL_COMMUNITY): Payer: Self-pay | Admitting: Psychiatry

## 2022-12-22 ENCOUNTER — Telehealth (INDEPENDENT_AMBULATORY_CARE_PROVIDER_SITE_OTHER): Payer: Medicaid Other | Admitting: Psychiatry

## 2022-12-22 DIAGNOSIS — F4310 Post-traumatic stress disorder, unspecified: Secondary | ICD-10-CM | POA: Diagnosis not present

## 2022-12-22 DIAGNOSIS — F331 Major depressive disorder, recurrent, moderate: Secondary | ICD-10-CM

## 2022-12-22 DIAGNOSIS — F603 Borderline personality disorder: Secondary | ICD-10-CM

## 2022-12-22 DIAGNOSIS — F411 Generalized anxiety disorder: Secondary | ICD-10-CM

## 2022-12-22 DIAGNOSIS — F410 Panic disorder [episodic paroxysmal anxiety] without agoraphobia: Secondary | ICD-10-CM

## 2022-12-22 DIAGNOSIS — F5104 Psychophysiologic insomnia: Secondary | ICD-10-CM
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
## 2022-12-22 NOTE — Patient Instructions (Signed)
We added lamotrigine (Lamictal) to your regimen today. Start lamotrigine 25 mg nightly for 2 weeks then increase to 50 mg nightly for 2 weeks then increase to 75 mg nightly for 2 weeks.

## 2022-12-24 ENCOUNTER — Encounter (HOSPITAL_COMMUNITY): Payer: Self-pay

## 2022-12-24 ENCOUNTER — Other Ambulatory Visit (HOSPITAL_COMMUNITY): Payer: Self-pay | Admitting: Psychiatry

## 2022-12-24 DIAGNOSIS — F331 Major depressive disorder, recurrent, moderate: Secondary | ICD-10-CM

## 2022-12-24 DIAGNOSIS — F603 Borderline personality disorder: Secondary | ICD-10-CM

## 2022-12-24 MED ORDER — LAMOTRIGINE 25 MG PO TABS
ORAL_TABLET | ORAL | 1 refills | Status: DC
Start: 2022-12-24 — End: 2023-01-14

## 2022-12-24 NOTE — Progress Notes (Signed)
Prescription sent for Lamictal which was intended on last appointment 12/16/2022.

## 2023-01-06 ENCOUNTER — Ambulatory Visit (INDEPENDENT_AMBULATORY_CARE_PROVIDER_SITE_OTHER): Payer: Medicaid Other | Admitting: Internal Medicine

## 2023-01-13 ENCOUNTER — Other Ambulatory Visit (HOSPITAL_COMMUNITY): Payer: Self-pay | Admitting: Psychiatry

## 2023-01-13 DIAGNOSIS — F332 Major depressive disorder, recurrent severe without psychotic features: Secondary | ICD-10-CM

## 2023-01-13 DIAGNOSIS — F331 Major depressive disorder, recurrent, moderate: Secondary | ICD-10-CM

## 2023-01-13 DIAGNOSIS — F40240 Claustrophobia: Secondary | ICD-10-CM

## 2023-01-13 DIAGNOSIS — F603 Borderline personality disorder: Secondary | ICD-10-CM

## 2023-01-13 DIAGNOSIS — F4310 Post-traumatic stress disorder, unspecified: Secondary | ICD-10-CM

## 2023-01-13 DIAGNOSIS — F410 Panic disorder [episodic paroxysmal anxiety] without agoraphobia: Secondary | ICD-10-CM

## 2023-01-14 ENCOUNTER — Ambulatory Visit: Payer: BC Managed Care – PPO | Admitting: "Endocrinology

## 2023-01-19 ENCOUNTER — Ambulatory Visit (INDEPENDENT_AMBULATORY_CARE_PROVIDER_SITE_OTHER): Payer: Medicaid Other | Admitting: Internal Medicine

## 2023-01-25 ENCOUNTER — Telehealth (INDEPENDENT_AMBULATORY_CARE_PROVIDER_SITE_OTHER): Payer: MEDICAID | Admitting: Psychiatry

## 2023-01-25 ENCOUNTER — Encounter (HOSPITAL_COMMUNITY): Payer: Self-pay | Admitting: Psychiatry

## 2023-01-25 DIAGNOSIS — F603 Borderline personality disorder: Secondary | ICD-10-CM | POA: Diagnosis not present

## 2023-01-25 DIAGNOSIS — F410 Panic disorder [episodic paroxysmal anxiety] without agoraphobia: Secondary | ICD-10-CM

## 2023-01-25 DIAGNOSIS — F331 Major depressive disorder, recurrent, moderate: Secondary | ICD-10-CM

## 2023-01-25 DIAGNOSIS — F411 Generalized anxiety disorder: Secondary | ICD-10-CM

## 2023-01-25 DIAGNOSIS — F4310 Post-traumatic stress disorder, unspecified: Secondary | ICD-10-CM | POA: Diagnosis not present

## 2023-01-25 MED ORDER — LAMOTRIGINE 100 MG PO TABS: 100.0000 mg | ORAL_TABLET | Freq: Every evening | ORAL | 2 refills | Status: DC

## 2023-01-25 MED ORDER — PROPRANOLOL HCL 10 MG PO TABS: 10.0000 mg | ORAL_TABLET | Freq: Every day | ORAL | 1 refills | Status: DC

## 2023-01-25 NOTE — Patient Instructions (Signed)
We decreased the propranolol to 10mg  once daily today and will plan on increasing the lamictal to 100mg  once daily once you finish the 75mg  dose that was just called in. Keep up the good work with practicing from the DBT workbook.
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
## 2023-02-01 ENCOUNTER — Other Ambulatory Visit (HOSPITAL_COMMUNITY): Payer: Self-pay | Admitting: Psychiatry

## 2023-02-01 ENCOUNTER — Encounter (HOSPITAL_COMMUNITY): Payer: Self-pay

## 2023-02-01 DIAGNOSIS — F331 Major depressive disorder, recurrent, moderate: Secondary | ICD-10-CM

## 2023-02-01 DIAGNOSIS — F603 Borderline personality disorder: Secondary | ICD-10-CM

## 2023-02-01 DIAGNOSIS — F410 Panic disorder [episodic paroxysmal anxiety] without agoraphobia: Secondary | ICD-10-CM

## 2023-02-01 MED ORDER — PROPRANOLOL HCL 10 MG PO TABS: 10.0000 mg | ORAL_TABLET | Freq: Every day | ORAL | 1 refills | Status: DC

## 2023-02-01 MED ORDER — LAMOTRIGINE 100 MG PO TABS: 100.0000 mg | ORAL_TABLET | Freq: Every evening | ORAL | 2 refills | Status: DC

## 2023-02-01 NOTE — Progress Notes (Signed)
Patient had to leave home unexpectedly to care for family member and wasn't able to pick up prescriptions that were sent at the end of last appointment. Sent to [facility] pharmacy per her request.

## 2023-02-10 ENCOUNTER — Telehealth: Payer: Self-pay | Admitting: Nurse Practitioner

## 2023-02-10 DIAGNOSIS — R12 Heartburn: Secondary | ICD-10-CM

## 2023-02-10 NOTE — Progress Notes (Signed)
Marcia Gonzalez,  If the medications that you have been prescribed in the past have not given you relief we would recommend speaking to your primary care provider about a possible referral to a GI specialist.   The medications you listed as unhelpful are the medications that we would suggest, so for additional medical management of your heartburn you will need to be seen in person.   I feel your condition warrants further evaluation and I recommend that you be seen for a face to face visit.  Please contact your primary care physician practice to be seen. Many offices offer virtual options to be seen via video if you are not comfortable going in person to a medical facility at this time.  NOTE: You will NOT be charged for this eVisit.  If you do not have a PCP, [facility] offers a free physician referral service available at [phone]. Our trained staff has the experience, knowledge and resources to put you in touch with a physician who is right for you.    If you are having a true medical emergency please call 911.   Your e-visit answers were reviewed by a board certified advanced clinical practitioner to complete your personal care plan.  Thank you for using e-Visits.

## 2023-02-12 ENCOUNTER — Encounter: Payer: Self-pay | Admitting: Family Medicine

## 2023-02-12 ENCOUNTER — Telehealth: Payer: Self-pay | Admitting: Family Medicine

## 2023-02-12 ENCOUNTER — Other Ambulatory Visit: Payer: Self-pay | Admitting: Family Medicine

## 2023-02-12 DIAGNOSIS — E063 Autoimmune thyroiditis: Secondary | ICD-10-CM

## 2023-02-12 MED ORDER — SYNTHROID 88 MCG PO TABS
88.0000 ug | ORAL_TABLET | Freq: Every day | ORAL | 3 refills | Status: DC
Start: 2023-02-12 — End: 2023-04-13

## 2023-02-12 MED ORDER — SYNTHROID 88 MCG PO TABS
88.0000 ug | ORAL_TABLET | Freq: Every day | ORAL | 0 refills | Status: AC
Start: 2023-02-12 — End: ?

## 2023-02-12 NOTE — Telephone Encounter (Signed)
  Prescription Request  02/12/2023  Is this a "Controlled Substance" medicine? SYNTHROID 88 MCG tablet   Have you seen your PCP in the last 2 weeks? no  If YES, route message to pool  -  If NO, patient needs to be scheduled for appointment.  What is the name of the medication or equipment? SYNTHROID 88 MCG tablet   Have you contacted your pharmacy to request a refill? no   Which pharmacy would you like this sent to? [facility]  30 day supply name brand not generic. Pt says that her refill has not come in through mail order.   Patient notified that their request is being sent to the clinical staff for review and that they should receive a response within 2 business days.

## 2023-02-16 MED ORDER — SYNTHROID 88 MCG PO TABS
88.0000 ug | ORAL_TABLET | Freq: Every day | ORAL | 0 refills | Status: DC
Start: 2023-02-16 — End: 2023-02-22

## 2023-02-16 NOTE — Telephone Encounter (Signed)
Not sure why this was sent to me but rerouting to appropriate pools.

## 2023-02-16 NOTE — Telephone Encounter (Signed)
Medication sent- left detailed message

## 2023-02-20 ENCOUNTER — Telehealth: Payer: MEDICAID | Admitting: Family Medicine

## 2023-02-20 DIAGNOSIS — J069 Acute upper respiratory infection, unspecified: Secondary | ICD-10-CM | POA: Diagnosis not present

## 2023-02-20 MED ORDER — FLUTICASONE PROPIONATE 50 MCG/ACT NA SUSP: 2.0000 | Freq: Every day | NASAL | 6 refills | Status: DC

## 2023-02-20 MED ORDER — BENZONATATE 200 MG PO CAPS: 200.0000 mg | ORAL_CAPSULE | Freq: Two times a day (BID) | ORAL | 0 refills | Status: DC | PRN

## 2023-02-20 NOTE — Progress Notes (Signed)

## 2023-02-22 ENCOUNTER — Telehealth: Payer: Self-pay | Admitting: Family Medicine

## 2023-02-22 ENCOUNTER — Other Ambulatory Visit: Payer: Self-pay | Admitting: Family Medicine

## 2023-02-22 MED ORDER — ONDANSETRON 4 MG PO TBDP: 4.0000 mg | ORAL_TABLET | Freq: Three times a day (TID) | ORAL | 0 refills | Status: DC | PRN

## 2023-02-22 NOTE — Telephone Encounter (Signed)
done

## 2023-03-02 ENCOUNTER — Telehealth (HOSPITAL_COMMUNITY): Payer: MEDICAID | Admitting: Psychiatry

## 2023-03-02 ENCOUNTER — Encounter (HOSPITAL_COMMUNITY): Payer: Self-pay | Admitting: Psychiatry

## 2023-03-02 ENCOUNTER — Other Ambulatory Visit (HOSPITAL_COMMUNITY): Payer: Self-pay | Admitting: Psychiatry

## 2023-03-02 DIAGNOSIS — F4310 Post-traumatic stress disorder, unspecified: Secondary | ICD-10-CM

## 2023-03-02 DIAGNOSIS — F411 Generalized anxiety disorder: Secondary | ICD-10-CM

## 2023-03-02 DIAGNOSIS — F603 Borderline personality disorder: Secondary | ICD-10-CM

## 2023-03-02 DIAGNOSIS — F3341 Major depressive disorder, recurrent, in partial remission: Secondary | ICD-10-CM

## 2023-03-02 DIAGNOSIS — F331 Major depressive disorder, recurrent, moderate: Secondary | ICD-10-CM

## 2023-03-02 DIAGNOSIS — Z8639 Personal history of other endocrine, nutritional and metabolic disease: Secondary | ICD-10-CM

## 2023-03-02 DIAGNOSIS — Z8659 Personal history of other mental and behavioral disorders: Secondary | ICD-10-CM

## 2023-03-02 DIAGNOSIS — F410 Panic disorder [episodic paroxysmal anxiety] without agoraphobia: Secondary | ICD-10-CM

## 2023-03-02 DIAGNOSIS — F40240 Claustrophobia: Secondary | ICD-10-CM

## 2023-03-02 DIAGNOSIS — Z9151 Personal history of suicidal behavior: Secondary | ICD-10-CM

## 2023-03-02 MED ORDER — LAMOTRIGINE 100 MG PO TABS
100.0000 mg | ORAL_TABLET | Freq: Every evening | ORAL | 0 refills | Status: DC
Start: 2023-03-02 — End: 2023-05-31

## 2023-03-02 MED ORDER — CITALOPRAM HYDROBROMIDE 40 MG PO TABS
40.0000 mg | ORAL_TABLET | Freq: Every day | ORAL | 2 refills | Status: DC
Start: 2023-03-02 — End: 2023-03-02
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
## 2023-03-02 NOTE — Patient Instructions (Addendum)
As we discussed if you are interested in new mother support groups there are some that are available through [facility] and can be found here: [email or web address]  The only medication change we made was fully discontinuing the propranolol since it does not seem like you needed at this point.  I will coordinate with your PCP to let them know about your reflux issue.

## 2023-03-18 ENCOUNTER — Other Ambulatory Visit (HOSPITAL_COMMUNITY): Payer: Self-pay | Admitting: Psychiatry

## 2023-03-18 DIAGNOSIS — F410 Panic disorder [episodic paroxysmal anxiety] without agoraphobia: Secondary | ICD-10-CM

## 2023-03-19 ENCOUNTER — Ambulatory Visit: Payer: BC Managed Care – PPO | Admitting: "Endocrinology

## 2023-03-29 ENCOUNTER — Emergency Department (HOSPITAL_COMMUNITY)
Admission: EM | Admit: 2023-03-29 | Discharge: 2023-03-29 | Discharge status: Against Medical Advice | Payer: MEDICAID | Source: Home / Self Care

## 2023-04-13 ENCOUNTER — Ambulatory Visit (INDEPENDENT_AMBULATORY_CARE_PROVIDER_SITE_OTHER): Payer: MEDICAID | Admitting: Family Medicine

## 2023-04-13 ENCOUNTER — Encounter: Payer: Self-pay | Admitting: Family Medicine

## 2023-04-13 VITALS — BP 122/81 | HR 92 | Temp 98.4°F | Ht 65.0 in | Wt 247.0 lb

## 2023-04-13 DIAGNOSIS — E782 Mixed hyperlipidemia: Secondary | ICD-10-CM | POA: Diagnosis not present

## 2023-04-13 DIAGNOSIS — R110 Nausea: Secondary | ICD-10-CM | POA: Diagnosis not present

## 2023-04-13 DIAGNOSIS — Z23 Encounter for immunization: Secondary | ICD-10-CM

## 2023-04-13 DIAGNOSIS — F332 Major depressive disorder, recurrent severe without psychotic features: Secondary | ICD-10-CM

## 2023-04-13 DIAGNOSIS — E063 Autoimmune thyroiditis: Secondary | ICD-10-CM

## 2023-04-13 DIAGNOSIS — M79674 Pain in right toe(s): Secondary | ICD-10-CM

## 2023-04-13 MED ORDER — SEMAGLUTIDE-WEIGHT MANAGEMENT 0.5 MG/0.5ML ~~LOC~~ SOAJ
0.5000 mg | SUBCUTANEOUS | 0 refills | Status: AC
Start: 2023-05-12 — End: 2023-06-09

## 2023-04-13 MED ORDER — ONDANSETRON 4 MG PO TBDP
4.0000 mg | ORAL_TABLET | Freq: Three times a day (TID) | ORAL | 0 refills | Status: DC | PRN
Start: 2023-04-13 — End: 2023-07-28

## 2023-04-13 MED ORDER — SEMAGLUTIDE-WEIGHT MANAGEMENT 0.25 MG/0.5ML ~~LOC~~ SOAJ
0.2500 mg | SUBCUTANEOUS | 0 refills | Status: AC
Start: 2023-04-13 — End: 2023-05-11

## 2023-04-13 MED ORDER — SEMAGLUTIDE-WEIGHT MANAGEMENT 2.4 MG/0.75ML ~~LOC~~ SOAJ
2.4000 mg | SUBCUTANEOUS | 3 refills | Status: AC
Start: 2023-08-07 — End: ?

## 2023-04-13 MED ORDER — SEMAGLUTIDE-WEIGHT MANAGEMENT 1.7 MG/0.75ML ~~LOC~~ SOAJ
1.7000 mg | SUBCUTANEOUS | 0 refills | Status: AC
Start: 2023-07-09 — End: 2023-08-06

## 2023-04-13 MED ORDER — SYNTHROID 88 MCG PO TABS
88.0000 ug | ORAL_TABLET | Freq: Every day | ORAL | 3 refills | Status: DC
Start: 2023-04-13 — End: 2023-05-12

## 2023-04-13 MED ORDER — PANTOPRAZOLE SODIUM 40 MG PO TBEC
40.0000 mg | DELAYED_RELEASE_TABLET | Freq: Every day | ORAL | 3 refills | Status: DC | PRN
Start: 2023-04-13 — End: 2024-02-02

## 2023-04-13 MED ORDER — SEMAGLUTIDE-WEIGHT MANAGEMENT 1 MG/0.5ML ~~LOC~~ SOAJ
1.0000 mg | SUBCUTANEOUS | 0 refills | Status: AC
Start: 2023-06-10 — End: 2023-07-08

## 2023-04-13 NOTE — Progress Notes (Signed)
Subjective: CC: Hypothyroidism, obesity PCP: Raliegh Ip, DO GLO:VFIEPPI Macfadyen is a 27 y.o. female presenting to clinic today for:  1.  Hashimoto's hypothyroidism/morbid obesity Reports compliance with her thyroid replacement.  No reports of tremor, heart palpitations, changes in bowel habits.  Continues to have quite a bit of difficulty with weight and would like to consider something for weight loss.  She is currently insured by Medicaid. She has seen healthy weight and wellness in the past but it was unaffordable for her so she discontinued after a couple of visits.  She lost very little weight during that time.  Never was treated with any prescription medications. She has also tried veganism and had about a 20 pound weight loss but this was not sustainable.  She did this about 3 years ago Has tried Atkins diet a few months ago and lost maybe 2 or 3 pounds but again not sustainable Has tried keto diet which raised her blood pressure unfortunately.  She lost about 4 pounds but again discontinued due to blood pressure rise.  She is never used anything over-the-counter nor been prescribed any prescription pills for weight loss.  2.  Great toe pain Patient reports that she injured her right big toe a couple of months ago.  She still has some intermittent swelling and burning pain that goes across the toe.  She has been utilizing OTC analgesics with some relief of this.  She is able to ambulate independently.   ROS: Per HPI  Allergies  Allergen Reactions   Codeine Nausea Only   Amoxicillin-Pot Clavulanate Other (See Comments)    Hallucinations   Latex Other (See Comments)    Gets Blisters   Misoprostol     Left blisters in mouth   Oxycodone Hives and Itching    Tylenol 3   Pollen Extract Other (See Comments)   Other Rash    Latex tape, causes reaction and blisters.   Past Medical History:  Diagnosis Date   ADHD (attention deficit hyperactivity disorder)    Akathisia  10/05/2022   Allergy    Anemia    Anxiety    Arthritis    Asthma    Chronic hypertension affecting pregnancy 12/03/2021   Dx 12/03/2021 (by today's BP + Joseph Pierini values); rx Labetalol 200mg  bid; growth q 4wks; 2x/wk testing @ 32wks; IOL 37-39wk   Constipation 08/14/2022   Depression    Encounter for supervision of high risk pregnancy in second trimester, antepartum 10/21/2021         FAMILY TREE     RESULTS  Language  English  Pap  (needs postpartum)  Initiated care at  13wks  GC/CT  Initial:  -/-          36wks:  Dating by  6wk [place]        Support person     Genetics  NT/IT: neg/neg    AFP:      Panorama: low risk female  BP cuff  rx'd  Carrier Screen  Neg 05/01/21        Calio/Hgb Elec     Rhogam  n/a        TDaP vaccine  02/11/22  Blood Type  O/Positive/-- (04/04 1619)  Flu v   Endometriosis    GERD (gastroesophageal reflux disease)    History of borderline personality disorder    Hypertension    gestational   Hypothyroidism    IIH (idiopathic intracranial hypertension)    Insomnia 10/23/2022   Missed periods 09/22/2022  Polypharmacy 10/13/2022   Pregnancy examination or test, negative result 09/22/2022   Pregnancy induced hypertension    PTSD (post-traumatic stress disorder)    Suicidal ideation 08/07/2022   UTI (urinary tract infection)    Vaginal delivery 04/05/2022   Vitamin D deficiency 09/08/2022   Wells' syndrome     Current Outpatient Medications:    calcium carbonate (TUMS - DOSED IN MG ELEMENTAL CALCIUM) 500 MG chewable tablet, Chew 1 tablet by mouth 2 (two) times daily., Disp: , Rfl:    citalopram (CELEXA) 40 MG tablet, TAKE 1 TABLET BY MOUTH EVERY DAY, Disp: 90 tablet, Rfl: 0   lamoTRIgine (LAMICTAL) 100 MG tablet, Take 1 tablet (100 mg total) by mouth at bedtime. After finishing 75mg  dose., Disp: 90 tablet, Rfl: 0   Semaglutide-Weight Management 0.25 MG/0.5ML SOAJ, Inject 0.25 mg into the skin once a week for 28 days., Disp: 2 mL, Rfl: 0   [START ON 05/12/2023]  Semaglutide-Weight Management 0.5 MG/0.5ML SOAJ, Inject 0.5 mg into the skin once a week for 28 days., Disp: 2 mL, Rfl: 0   [START ON 06/10/2023] Semaglutide-Weight Management 1 MG/0.5ML SOAJ, Inject 1 mg into the skin once a week for 28 days., Disp: 2 mL, Rfl: 0   [START ON 07/09/2023] Semaglutide-Weight Management 1.7 MG/0.75ML SOAJ, Inject 1.7 mg into the skin once a week for 28 days., Disp: 3 mL, Rfl: 0   [START ON 08/07/2023] Semaglutide-Weight Management 2.4 MG/0.75ML SOAJ, Inject 2.4 mg into the skin once a week., Disp: 9 mL, Rfl: 3   ondansetron (ZOFRAN-ODT) 4 MG disintegrating tablet, Take 1 tablet (4 mg total) by mouth every 8 (eight) hours as needed for nausea or vomiting., Disp: 20 tablet, Rfl: 0   pantoprazole (PROTONIX) 40 MG tablet, Take 1 tablet (40 mg total) by mouth daily as needed., Disp: 90 tablet, Rfl: 3   SYNTHROID 88 MCG tablet, Take 1 tablet (88 mcg total) by mouth daily before breakfast., Disp: 90 tablet, Rfl: 3 Social History   Socioeconomic History   Marital status: Married    Spouse name: Jeri Modena   Number of children: 1   Years of education: 12   Highest education level: High school graduate  Occupational History   Not on file  Tobacco Use   Smoking status: Never    Passive exposure: Never   Smokeless tobacco: Never  Vaping Use   Vaping status: Never Used  Substance and Sexual Activity   Alcohol use: Not Currently   Drug use: Never   Sexual activity: Yes    Birth control/protection: None, Condom  Other Topics Concern   Not on file  Social History Narrative   Not on file   Social Determinants of Health   Financial Resource Strain: Low Risk  (04/13/2022)   Overall Financial Resource Strain (CARDIA)    Difficulty of Paying Living Expenses: Not very hard  Food Insecurity: No Food Insecurity (04/13/2022)   Hunger Vital Sign    Worried About Running Out of Food in the Last Year: Never true    Ran Out of Food in the Last Year: Never true  Transportation  Needs: No Transportation Needs (04/13/2022)   PRAPARE - Administrator, Civil Service (Medical): No    Lack of Transportation (Non-Medical): No  Physical Activity: Insufficiently Active (04/13/2022)   Exercise Vital Sign    Days of Exercise per Week: 1 day    Minutes of Exercise per Session: 10 min  Stress: No Stress Concern Present (04/13/2022)   [place]  [employer] - Occupational Stress Questionnaire    Feeling of Stress : Only a little  Social Connections: Unknown (11/09/2022)   Received from [facility]   Social Network    Social Network: Not on file  Intimate Partner Violence: Unknown (11/09/2022)   Received from [facility]   HITS    Physically Hurt: Not on file    Insult or Talk Down To: Not on file    Threaten Physical Harm: Not on file    Scream or Curse: Not on file   Family History  Problem Relation Age of Onset   COPD Maternal Grandmother    Arthritis Maternal Grandmother    Depression Father    Anxiety disorder Father    Kidney disease Mother    Hypertension Mother    Hyperlipidemia Mother    Depression Mother    Cancer Mother        cervical   Arthritis Mother    Anxiety disorder Mother    Asthma Brother    Vesicoureteral reflux Son     Objective: Office vital signs reviewed. BP 122/81   Pulse 92   Temp 98.4 F (36.9 C)   Ht 5\' 5"  (1.651 m)   Wt 247 lb (112 kg)   LMP 04/05/2023   SpO2 98%   Breastfeeding No   BMI 41.10 kg/m   Physical Examination:  General: Awake, alert, morbidly obese, No acute distress HEENT: sclera white, MMM.  No exophthalmos or goiter Cardio: regular rate and rhythm, S1S2 heard, no murmurs appreciated Pulm: clear to auscultation bilaterally, no wheezes, rhonchi or rales; normal work of breathing on room air MSK: Right great toe without any palpable bony deformities.  No gross swelling.  No erythema or skin breakdown.  Patient is ambulating  independently  Assessment/ Plan: 27 y.o. female   Hashimoto's thyroiditis - Plan: TSH, T4, Free, SYNTHROID 88 MCG tablet  Nausea - Plan: ondansetron (ZOFRAN-ODT) 4 MG disintegrating tablet, pantoprazole (PROTONIX) 40 MG tablet  Morbid obesity (HCC) - Plan: ondansetron (ZOFRAN-ODT) 4 MG disintegrating tablet, Semaglutide-Weight Management 0.25 MG/0.5ML SOAJ, Semaglutide-Weight Management 0.5 MG/0.5ML SOAJ, Semaglutide-Weight Management 1 MG/0.5ML SOAJ, Semaglutide-Weight Management 1.7 MG/0.75ML SOAJ, Semaglutide-Weight Management 2.4 MG/0.75ML SOAJ  Mixed hyperlipidemia - Plan: Semaglutide-Weight Management 0.25 MG/0.5ML SOAJ, Semaglutide-Weight Management 0.5 MG/0.5ML SOAJ, Semaglutide-Weight Management 1 MG/0.5ML SOAJ, Semaglutide-Weight Management 1.7 MG/0.75ML SOAJ, Semaglutide-Weight Management 2.4 MG/0.75ML SOAJ  Major depressive disorder, recurrent severe without psychotic features (HCC) - Plan: Semaglutide-Weight Management 0.25 MG/0.5ML SOAJ, Semaglutide-Weight Management 0.5 MG/0.5ML SOAJ, Semaglutide-Weight Management 1 MG/0.5ML SOAJ, Semaglutide-Weight Management 1.7 MG/0.75ML SOAJ, Semaglutide-Weight Management 2.4 MG/0.75ML SOAJ  Great toe pain, right  Check thyroid levels.  Synthroid renewed.  PPI renewed.  We discussed risk of increased GERD with GLP usage and I have recommended Pepcid if needed for breakthrough.  I think that she is a good candidate for GLP treatment given BMI of 41.1 kg/m.  She has failed multiple diets including keto, Atkins, vegan and failed weight loss clinic interventions due to cost issues.  She has no apparent contraindications to utilization of the GLP.  I did counsel her at length with regards to increased fertility with this medication.  She is not currently on any contraceptive medications and I encouraged her to contact her OB/GYN for options.  We discussed OCPs would not be ideal and I would prefer her on something that is not metabolized through the  GI tract due to the nature of this medication.  Supportive care for  great toe pain.  Would like to see her back in 3 months for weight check, sooner if concerns arise  Raliegh Ip, DO [facility] [phone]

## 2023-04-13 NOTE — Patient Instructions (Signed)
Tips for success with Wegovy (and by success, how not to be super sick on your stomach): Eat small meals AVOID heavy foods (fried/ high in carbs like bread, pasta, rice) AVOID carbonated beverages (soda/ beer, as these can increase bloating) DOUBLE your water intake (will help you avoid constipation/ dehydration)  Wegovy CAN cause: Nausea Abdominal pain Increased acid reflux (sometimes presents as "sour burps") Constipation OR Diarrhea Fatigue (especially when you first start it)  

## 2023-04-14 LAB — TSH: TSH: 2.14 u[IU]/mL (ref 0.450–4.500)

## 2023-04-14 LAB — T4, FREE: Free T4: 1.39 ng/dL (ref 0.82–1.77)

## 2023-04-15 ENCOUNTER — Telehealth: Payer: Self-pay | Admitting: Family Medicine

## 2023-04-15 ENCOUNTER — Telehealth: Payer: Self-pay

## 2023-04-15 NOTE — Telephone Encounter (Signed)
Pharmacy Patient Advocate Encounter   Received notification from Pt Calls Messages that prior authorization for Wegovy 0.25MG /0.5ML auto-injectors is required/requested.   Insurance verification completed.   The patient is insured through [employer] .   Per test claim: PA required; PA submitted to [facility] via CoverMyMeds Key/confirmation #/EOC [id number] Status is pending

## 2023-04-15 NOTE — Telephone Encounter (Signed)
Pts [id number] Rx is requiring PA.

## 2023-04-15 NOTE — Telephone Encounter (Signed)
PA request has been Submitted. New Encounter created for follow up. For additional info see Pharmacy Prior Auth telephone encounter from 04/15/23.

## 2023-04-16 NOTE — Telephone Encounter (Signed)
Pharmacy Patient Advocate Encounter  Received notification from [facility] that Prior Authorization for Wegovy 0.25MG /0.5ML auto-injectors has been APPROVED from 04/16/23 to 10/14/23   PA #/Case ID/Reference #: [id number]

## 2023-04-19 NOTE — Telephone Encounter (Signed)
Pt called back stating that she was able to get her [facility] Rx filled at [facility] pharmacy in [place]. Also says that for future, she will be using [facility] because they take her insurance. Needs all meds transferred to [facility].

## 2023-04-19 NOTE — Telephone Encounter (Signed)
[facility] [place] to get them to [facility] her [facility] Rx and they do not have any in stock. Needs advise on what to do.

## 2023-04-20 ENCOUNTER — Encounter: Payer: Self-pay | Admitting: Family Medicine

## 2023-04-20 ENCOUNTER — Other Ambulatory Visit: Payer: Self-pay | Admitting: Family Medicine

## 2023-04-20 DIAGNOSIS — E782 Mixed hyperlipidemia: Secondary | ICD-10-CM

## 2023-04-20 DIAGNOSIS — F332 Major depressive disorder, recurrent severe without psychotic features: Secondary | ICD-10-CM

## 2023-04-20 DIAGNOSIS — Z30015 Encounter for initial prescription of vaginal ring hormonal contraceptive: Secondary | ICD-10-CM

## 2023-04-20 MED ORDER — SEMAGLUTIDE-WEIGHT MANAGEMENT 0.25 MG/0.5ML ~~LOC~~ SOAJ
0.2500 mg | SUBCUTANEOUS | 0 refills | Status: DC
Start: 2023-04-20 — End: 2023-04-23

## 2023-04-20 MED ORDER — SEMAGLUTIDE-WEIGHT MANAGEMENT 2.4 MG/0.75ML ~~LOC~~ SOAJ
2.4000 mg | SUBCUTANEOUS | 3 refills | Status: DC
Start: 2023-08-07 — End: 2023-07-29

## 2023-04-20 MED ORDER — SEMAGLUTIDE-WEIGHT MANAGEMENT 0.5 MG/0.5ML ~~LOC~~ SOAJ
0.5000 mg | SUBCUTANEOUS | 0 refills | Status: DC
Start: 2023-05-12 — End: 2023-04-22

## 2023-04-20 MED ORDER — ETONOGESTREL-ETHINYL ESTRADIOL 0.12-0.015 MG/24HR VA RING: VAGINAL_RING | VAGINAL | 4 refills | Status: DC

## 2023-04-20 MED ORDER — SEMAGLUTIDE-WEIGHT MANAGEMENT 1.7 MG/0.75ML ~~LOC~~ SOAJ
1.7000 mg | SUBCUTANEOUS | 0 refills | Status: DC
Start: 2023-07-09 — End: 2023-07-29

## 2023-04-20 MED ORDER — SEMAGLUTIDE-WEIGHT MANAGEMENT 1 MG/0.5ML ~~LOC~~ SOAJ: 1.0000 mg | SUBCUTANEOUS | 0 refills | Status: AC

## 2023-04-21 ENCOUNTER — Other Ambulatory Visit: Payer: Self-pay | Admitting: Family Medicine

## 2023-04-21 DIAGNOSIS — E063 Autoimmune thyroiditis: Secondary | ICD-10-CM

## 2023-04-22 ENCOUNTER — Other Ambulatory Visit: Payer: Self-pay | Admitting: *Deleted

## 2023-04-22 DIAGNOSIS — E782 Mixed hyperlipidemia: Secondary | ICD-10-CM

## 2023-04-22 DIAGNOSIS — F332 Major depressive disorder, recurrent severe without psychotic features: Secondary | ICD-10-CM

## 2023-04-22 MED ORDER — SEMAGLUTIDE-WEIGHT MANAGEMENT 0.5 MG/0.5ML ~~LOC~~ SOAJ: 0.5000 mg | SUBCUTANEOUS | 0 refills | Status: DC

## 2023-04-22 NOTE — Telephone Encounter (Addendum)
[facility] does not have this in stock, [facility], she is needing her 0.5 mg dose

## 2023-04-22 NOTE — Telephone Encounter (Signed)
Pt [facility] [place] [facility] [address]

## 2023-04-23 ENCOUNTER — Telehealth: Payer: Self-pay | Admitting: Family Medicine

## 2023-04-23 DIAGNOSIS — E782 Mixed hyperlipidemia: Secondary | ICD-10-CM

## 2023-04-23 DIAGNOSIS — F332 Major depressive disorder, recurrent severe without psychotic features: Secondary | ICD-10-CM

## 2023-04-23 MED ORDER — SEMAGLUTIDE-WEIGHT MANAGEMENT 0.25 MG/0.5ML ~~LOC~~ SOAJ: 0.2500 mg | SUBCUTANEOUS | 0 refills | Status: AC

## 2023-04-23 NOTE — Telephone Encounter (Signed)
Patient would like her medication Reginal Lutes) resent to:  [facility] [address] [place], [place]  [facility] not [facility]

## 2023-04-23 NOTE — Telephone Encounter (Signed)
Patient aware and verbalized understanding. °

## 2023-04-23 NOTE — Telephone Encounter (Signed)
Sent to listed pharmacy

## 2023-04-28 ENCOUNTER — Telehealth: Payer: Self-pay | Admitting: Family Medicine

## 2023-04-28 DIAGNOSIS — N939 Abnormal uterine and vaginal bleeding, unspecified: Secondary | ICD-10-CM

## 2023-04-28 NOTE — Telephone Encounter (Signed)
That's not typical.  I recommend she schedule an OV with OB for pelvic exam.

## 2023-04-29 NOTE — Telephone Encounter (Signed)
Pt has been made aware. 

## 2023-04-29 NOTE — Telephone Encounter (Signed)
Pt called her [facility] in [place] and cannot see her unitll NOV. If Dr Nadine Counts sends in a STAT referral, then she can get in sooner.

## 2023-04-30 NOTE — Telephone Encounter (Signed)
I've placed the referral urgently per her request. Though am not sure that they will consider the matter urgent given the nature of bleeding.

## 2023-04-30 NOTE — Addendum Note (Signed)
Addended by: Raliegh Ip on: 04/30/2023 07:43 AM   Modules accepted: Orders

## 2023-05-07 ENCOUNTER — Telehealth: Payer: Self-pay | Admitting: Family Medicine

## 2023-05-07 DIAGNOSIS — E782 Mixed hyperlipidemia: Secondary | ICD-10-CM

## 2023-05-07 DIAGNOSIS — F332 Major depressive disorder, recurrent severe without psychotic features: Secondary | ICD-10-CM

## 2023-05-07 MED ORDER — SEMAGLUTIDE-WEIGHT MANAGEMENT 0.5 MG/0.5ML ~~LOC~~ SOAJ: 0.5000 mg | SUBCUTANEOUS | 0 refills | Status: AC

## 2023-05-07 NOTE — Telephone Encounter (Signed)
Called patient they gave her the .29 and would not let her pick up the .5mg  So sent in the lower dose patient is aware and verbalized understanding

## 2023-05-10 ENCOUNTER — Telehealth: Payer: Self-pay | Admitting: Family Medicine

## 2023-05-10 NOTE — Telephone Encounter (Signed)
Left detailed message.   

## 2023-05-10 NOTE — Telephone Encounter (Signed)
As we discussed during our visit, she will advance dose EVERY 30 days.

## 2023-05-11 ENCOUNTER — Emergency Department (HOSPITAL_COMMUNITY)
Admission: EM | Admit: 2023-05-11 | Discharge: 2023-05-11 | Disposition: A | Discharge status: Home / Self Care | Payer: MEDICAID | Attending: Emergency Medicine | Admitting: Emergency Medicine

## 2023-05-11 DIAGNOSIS — N938 Other specified abnormal uterine and vaginal bleeding: Secondary | ICD-10-CM | POA: Diagnosis present

## 2023-05-11 DIAGNOSIS — Z91040 Latex allergy status: Secondary | ICD-10-CM | POA: Insufficient documentation

## 2023-05-11 DIAGNOSIS — R42 Dizziness and giddiness: Secondary | ICD-10-CM | POA: Insufficient documentation

## 2023-05-11 DIAGNOSIS — N939 Abnormal uterine and vaginal bleeding, unspecified: Secondary | ICD-10-CM

## 2023-05-11 LAB — CBC
HCT: 37.5 % (ref 36.0–46.0)
Hemoglobin: 12 g/dL (ref 12.0–15.0)
MCH: 26.8 pg (ref 26.0–34.0)
MCHC: 32 g/dL (ref 30.0–36.0)
MCV: 83.7 fL (ref 80.0–100.0)
Platelets: 309 10*3/uL (ref 150–400)
RBC: 4.48 MIL/uL (ref 3.87–5.11)
RDW: 12.8 % (ref 11.5–15.5)
WBC: 6.8 10*3/uL (ref 4.0–10.5)
nRBC: 0 % (ref 0.0–0.2)

## 2023-05-11 LAB — HCG, SERUM, QUALITATIVE: Preg, Serum: NEGATIVE

## 2023-05-11 MED ORDER — MECLIZINE HCL 25 MG PO TABS
25.0000 mg | ORAL_TABLET | Freq: Once | ORAL | Status: AC
  Administered 2023-05-11: 25 mg via ORAL
  Filled 2023-05-11: qty 1

## 2023-05-11 MED ORDER — SODIUM CHLORIDE 0.9 % IV BOLUS
500.0000 mL | Freq: Once | INTRAVENOUS | Status: AC
  Administered 2023-05-11: 500 mL via INTRAVENOUS

## 2023-05-11 MED ORDER — TRANEXAMIC ACID-NACL 1000-0.7 MG/100ML-% IV SOLN
1000.0000 mg | Freq: Once | INTRAVENOUS | Status: AC
  Administered 2023-05-11: 1000 mg via INTRAVENOUS
  Filled 2023-05-11: qty 100

## 2023-05-11 MED ORDER — MEGESTROL ACETATE 40 MG PO TABS: 40.0000 mg | ORAL_TABLET | Freq: Two times a day (BID) | ORAL | 0 refills | Status: DC

## 2023-05-11 MED ORDER — MEGESTROL ACETATE 40 MG PO TABS
40.0000 mg | ORAL_TABLET | Freq: Two times a day (BID) | ORAL | 0 refills | Status: AC
Start: 2023-05-11 — End: 2023-05-18

## 2023-05-11 NOTE — Discharge Instructions (Addendum)
You were seen for your vaginal bleeding in the emergency department.   At home, please take the megace for your bleeding.    Check your MyChart online for the results of any tests that had not resulted by the time you left the emergency department.   Follow-up with your OB/GYN in 2-3 days regarding your visit.    Return immediately to the emergency department if you experience any of the following: Soaking through more than 2 pads an hour for 2 hours, fainting, or any other concerning symptoms.    Thank you for visiting our Emergency Department. It was a pleasure taking care of you today.

## 2023-05-11 NOTE — ED Provider Notes (Signed)
[place] EMERGENCY DEPARTMENT AT [facility] Provider Note   CSN: [id number] Arrival date & time: 05/11/23  0321     History  Chief Complaint  Patient presents with   Vaginal Bleeding    Marcia Gonzalez is a 27 y.o. female.  27 year old female G3P1 with a history of endometriosis and abnormal uterine bleeding who presents to the emergency department with vaginal bleeding.  Over the past month has been having vaginal bleeding and going through approximately 4 pads per day.  Says that recently she has become dizzy and almost passed out.  No worsening of the bleeding recently.  Started on Wegovy at the end of September and was started on NuvaRing for birth control.  This is when the bleeding started.  Took out the NuvaRing 2 weeks afterwards and has had persistent bleeding since then.  On norethindrone for the bleeding.  Unsure the dose.  Does not smoke and no history of DVT/PE.  Did have an ultrasound a year ago that did not show fibroids.  Not on any blood thinners.       Home Medications Prior to Admission medications   Medication Sig Start Date End Date Taking? Authorizing Provider  calcium carbonate (TUMS - DOSED IN MG ELEMENTAL CALCIUM) 500 MG chewable tablet Chew 1 tablet by mouth 2 (two) times daily.    [provider]  citalopram (CELEXA) 40 MG tablet TAKE 1 TABLET BY MOUTH EVERY DAY 03/02/23   Elsie Lincoln, MD  lamoTRIgine (LAMICTAL) 100 MG tablet Take 1 tablet (100 mg total) by mouth at bedtime. After finishing 75mg  dose. 03/02/23   Elsie Lincoln, MD  megestrol (MEGACE) 40 MG tablet Take 1 tablet (40 mg total) by mouth 2 (two) times daily for 7 days. 05/11/23 05/18/23  Rondel Baton, MD  ondansetron (ZOFRAN-ODT) 4 MG disintegrating tablet Take 1 tablet (4 mg total) by mouth every 8 (eight) hours as needed for nausea or vomiting. 04/13/23   Delynn Flavin M, DO  pantoprazole (PROTONIX) 40 MG tablet Take 1 tablet (40 mg total) by mouth daily as  needed. 04/13/23   Raliegh Ip, DO  Semaglutide-Weight Management 0.25 MG/0.5ML SOAJ Inject 0.25 mg into the skin once a week for 28 days. 04/23/23 05/21/23  Raliegh Ip, DO  Semaglutide-Weight Management 0.5 MG/0.5ML SOAJ Inject 0.5 mg into the skin once a week for 28 days. 05/12/23 06/09/23  Raliegh Ip, DO  Semaglutide-Weight Management 1 MG/0.5ML SOAJ Inject 1 mg into the skin once a week for 28 days. 06/10/23 07/08/23  Raliegh Ip, DO  Semaglutide-Weight Management 1.7 MG/0.75ML SOAJ Inject 1.7 mg into the skin once a week for 28 days. 07/09/23 08/06/23  Raliegh Ip, DO  Semaglutide-Weight Management 2.4 MG/0.75ML SOAJ Inject 2.4 mg into the skin once a week. 08/07/23   Raliegh Ip, DO  SYNTHROID 88 MCG tablet Take 1 tablet (88 mcg total) by mouth daily before breakfast. 04/13/23   Raliegh Ip, DO      Allergies    Codeine, Amoxicillin-pot clavulanate, Latex, Misoprostol, Oxycodone, Pollen extract, and Other    Review of Systems   Review of Systems  Physical Exam Updated Vital Signs BP (!) 120/53 (BP Location: Left Arm)   Pulse 68   Temp 98 F (36.7 C)   Resp 17   Ht 5\' 5"  (1.651 m)   Wt 112 kg   LMP 05/11/2023 (Exact Date)   SpO2 100%   BMI 41.10 kg/m  Physical Exam  Constitutional:      Appearance: Normal appearance.  Abdominal:     General: Abdomen is flat. There is no distension.     Palpations: There is no mass.     Tenderness: There is no abdominal tenderness. There is no guarding.  Genitourinary:    Comments: Chaperoned by cindy RN.  External genitalia unremarkable. Nor rashes or lesions noted.  Speculum exam with normal small amount of blood.  Vaginal wall mucosa is unremarkable.  Cervix visualized and is unremarkable (closed scant amount of blood).  Bimanual exam without cervical motion tenderness, adnexal tenderness or any masses appreciated.  Neurological:     Mental Status: She is alert.     ED  Results / Procedures / Treatments   Labs (all labs ordered are listed, but only abnormal results are displayed) Labs Reviewed  HCG, SERUM, QUALITATIVE  CBC    EKG EKG Interpretation Date/Time:  Tuesday May 11 2023 08:14:26 EDT Ventricular Rate:  60 PR Interval:  146 QRS Duration:  91 QT Interval:  425 QTC Calculation: 425 R Axis:   66  Text Interpretation: Sinus rhythm Nonspecific T abnrm, anterolateral leads Confirmed by Vonita Moss [phone]) on 05/11/2023 8:18:59 AM  Radiology No results found.  Procedures Procedures    Medications Ordered in ED Medications  tranexamic acid (CYKLOKAPRON) IVPB 1,000 mg (0 mg Intravenous Stopped 05/11/23 0849)  sodium chloride 0.9 % bolus 500 mL (0 mLs Intravenous Stopped 05/11/23 1023)  meclizine (ANTIVERT) tablet 25 mg (25 mg Oral Given 05/11/23 7829)    ED Course/ Medical Decision Making/ A&P                                 Medical Decision Making Amount and/or Complexity of Data Reviewed Labs: ordered.  Risk Prescription drug management.   Marcia Gonzalez is a 27 y.o. female with comorbidities that complicate the patient evaluation including G3P1 with a history of endometriosis and abnormal uterine bleeding who presents to the emergency department with vaginal bleeding.   Initial Ddx:  Symptomatic anemia, dehydration, abnormal uterine bleeding, fibroids, endometrial hyperplasia, birth control side effect, malignancy, ectopic pregnancy, miscarriage, IUP  MDM/Course:  Patient presents emergency department with vaginal bleeding.  Did have a syncopal episode but no injuries as a result of the fall.  Given the amount of bleeding that she is having and her reassuring vital signs do not feel that she is life-threatening condition at this time.  On pelvic exam only has small amount of blood.  No masses palpated.  Was given some IV fluids and she did have a syncopal event.  Her pregnancy test was negative.  Hemoglobin was WNL  making symptomatic anemia less likely.  Was given TXA and upon re-evaluation was feeling much better after the above interventions.  No increased vaginal bleeding.  Did discuss the fact that she will need a transvaginal ultrasound.  She reports that she already has one scheduled as an outpatient and prefers to follow-up without appointment though I did offer her one here in the emergency department.  No increased risk factors for DVT that would preclude her from getting Megace so we will start her on that and discontinue the other birth control that she was on.  Suspect that this may be somehow related to the NuvaRing that she was on in the past.  Return precautions discussed prior to discharge.  This patient presents to the ED for concern of complaints listed in  HPI, this involves an extensive number of treatment options, and is a complaint that carries with it a high risk of complications and morbidity. Disposition including potential need for admission considered.   Dispo: DC Home. Return precautions discussed including, but not limited to, those listed in the AVS. Allowed pt time to ask questions which were answered fully prior to dc.  Additional history obtained from spouse Records reviewed Outpatient Clinic Notes The following labs were independently interpreted: CBC and show no acute abnormality I personally reviewed and interpreted cardiac monitoring: normal sinus rhythm  I personally reviewed and interpreted the pt's EKG: see above for interpretation  I have reviewed the patients home medications and made adjustments as needed   Portions of this note were generated with Dragon dictation software. Dictation errors may occur despite best attempts at proofreading.           Final Clinical Impression(s) / ED Diagnoses Final diagnoses:  Vaginal bleeding  Lightheadedness    Rx / DC Orders ED Discharge Orders          Ordered    megestrol (MEGACE) 40 MG tablet  2 times daily,    Status:  Discontinued        05/11/23 1008    megestrol (MEGACE) 40 MG tablet  2 times daily        05/11/23 1009              Rondel Baton, MD 05/11/23 1745

## 2023-05-11 NOTE — ED Triage Notes (Signed)
Pt arrived POV for vaginal bleeding x 30 days, pt had the NOVA ring placed 9/30, then took it out 2 weeks after insertion d/t bleeding. Pt reports varies daily with how many pads she goes through an hours. Pt reports feeling dizziness since the vaginal bleeding. Reports Ferratin levels were an 8 a week or two ago. Hx of anemia. VSS, NAD noted, A&O x4.

## 2023-05-12 ENCOUNTER — Telehealth: Payer: Self-pay | Admitting: Family Medicine

## 2023-05-12 DIAGNOSIS — E063 Autoimmune thyroiditis: Secondary | ICD-10-CM

## 2023-05-12 MED ORDER — SYNTHROID 88 MCG PO TABS: 88.0000 ug | ORAL_TABLET | Freq: Every day | ORAL | 3 refills | Status: DC

## 2023-05-12 NOTE — Telephone Encounter (Signed)
Pt called stating that her Synthroid Rx needs to be sent to [facility] in [place] and needs to be sent as Brand Name only. Says [facility]'t [facility] [place] [facility] and she can't use the online portal because their company was ruined by [employer]. Insurance will only pay at [facility] pharmacy if it is sent as Brand Name for 30 day supply. Can't accept generic.

## 2023-05-13 NOTE — Telephone Encounter (Signed)
Prescription was sent to pharmacy on 05/12/23, patient aware

## 2023-05-18 ENCOUNTER — Telehealth: Payer: Self-pay | Admitting: Family Medicine

## 2023-05-18 NOTE — Telephone Encounter (Signed)
Pt went [facility] hospital a few days ago for vaginal bleeding. Pt was told to get on a progesterone only bc and to stop megestrol (MEGACE) 40 MG tablet. Use [facility] pharmacy.

## 2023-05-18 NOTE — Telephone Encounter (Signed)
I think this was meant to go to her Ob/GYN.  I've not managed this for her.  She just saw OB who was taking care of this.

## 2023-05-29 ENCOUNTER — Other Ambulatory Visit (HOSPITAL_COMMUNITY): Payer: Self-pay | Admitting: Psychiatry

## 2023-05-29 DIAGNOSIS — F40240 Claustrophobia: Secondary | ICD-10-CM

## 2023-05-29 DIAGNOSIS — F3341 Major depressive disorder, recurrent, in partial remission: Secondary | ICD-10-CM

## 2023-05-29 DIAGNOSIS — F4310 Post-traumatic stress disorder, unspecified: Secondary | ICD-10-CM

## 2023-05-29 DIAGNOSIS — F410 Panic disorder [episodic paroxysmal anxiety] without agoraphobia: Secondary | ICD-10-CM

## 2023-05-29 DIAGNOSIS — F603 Borderline personality disorder: Secondary | ICD-10-CM

## 2023-06-03 ENCOUNTER — Telehealth (HOSPITAL_COMMUNITY): Payer: Self-pay

## 2023-06-03 ENCOUNTER — Telehealth (HOSPITAL_COMMUNITY): Payer: MEDICAID | Admitting: Psychiatry

## 2023-06-03 NOTE — Telephone Encounter (Signed)
Erroneous entry

## 2023-06-10 ENCOUNTER — Telehealth: Payer: Self-pay | Admitting: Family Medicine

## 2023-06-10 ENCOUNTER — Encounter (HOSPITAL_COMMUNITY): Payer: Self-pay | Admitting: Psychiatry

## 2023-06-10 ENCOUNTER — Telehealth (HOSPITAL_COMMUNITY): Payer: MEDICAID | Admitting: Psychiatry

## 2023-06-10 DIAGNOSIS — Z8659 Personal history of other mental and behavioral disorders: Secondary | ICD-10-CM | POA: Insufficient documentation

## 2023-06-10 DIAGNOSIS — F411 Generalized anxiety disorder: Secondary | ICD-10-CM

## 2023-06-10 DIAGNOSIS — F3342 Major depressive disorder, recurrent, in full remission: Secondary | ICD-10-CM

## 2023-06-10 DIAGNOSIS — F4310 Post-traumatic stress disorder, unspecified: Secondary | ICD-10-CM

## 2023-06-10 DIAGNOSIS — F50814 Binge eating disorder, in remission: Secondary | ICD-10-CM

## 2023-06-10 DIAGNOSIS — F603 Borderline personality disorder: Secondary | ICD-10-CM

## 2023-06-10 DIAGNOSIS — F410 Panic disorder [episodic paroxysmal anxiety] without agoraphobia: Secondary | ICD-10-CM

## 2023-06-10 MED ORDER — ATOMOXETINE HCL 40 MG PO CAPS: ORAL_CAPSULE | ORAL | 2 refills | Status: DC

## 2023-06-10 MED ORDER — LAMOTRIGINE 25 MG PO TABS: 50.0000 mg | ORAL_TABLET | Freq: Every evening | ORAL | 2 refills | Status: DC

## 2023-06-10 NOTE — Telephone Encounter (Signed)
Copied from CRM [id number]. Topic: Clinical - Prescription Issue >> Jun 10, 2023  4:18 PM Sasha H wrote: Reason for CRM: Pt would like [facility] prescription sent to [facility] pharmacy at [facility] [place], [place] [address]
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
## 2023-06-10 NOTE — Patient Instructions (Signed)
We added Strattera (atomoxetine) 40 mg once daily to your regimen today.  Take this for 1 week and then you can increase to 80 mg daily thereafter if you are not having side effects.  I will coordinate with your PCP to get updated vitamin levels to rule out that as a cause for the hair loss, it should be noted that hypothyroidism can also lead to some chronic hair loss.

## 2023-06-11 ENCOUNTER — Other Ambulatory Visit: Payer: Self-pay | Admitting: Family Medicine

## 2023-06-11 DIAGNOSIS — L659 Nonscarring hair loss, unspecified: Secondary | ICD-10-CM

## 2023-06-11 NOTE — Telephone Encounter (Signed)
My chart message sent to pt.

## 2023-06-11 NOTE — Telephone Encounter (Signed)
-----   Message from [facility] sent at 06/11/2023  7:53 AM EST ----- Please offer pt next available lab visit.  Dr Adrian Blackwater would like some labs collected due to her hair loss.

## 2023-06-15 ENCOUNTER — Telehealth: Payer: MEDICAID | Admitting: Physician Assistant

## 2023-06-15 DIAGNOSIS — R109 Unspecified abdominal pain: Secondary | ICD-10-CM

## 2023-06-15 DIAGNOSIS — R112 Nausea with vomiting, unspecified: Secondary | ICD-10-CM

## 2023-06-15 NOTE — Progress Notes (Signed)
Because of concern of a complicated UTI as cause of your symptoms and need for exam and urine testing, I feel your condition warrants further evaluation and I recommend that you be seen in a face to face visit.   NOTE: There will be NO CHARGE for this eVisit   If you are having a true medical emergency please call 911.      For an urgent face to face visit,  has eight urgent care centers for your convenience:   NEW!! [facility] Health Urgent Care Center at [facility] Get Driving Directions [phone] [address], [address] [place], [address]    [facility] Health Urgent Care Center at [facility] Get Driving Directions [phone] [address] [place], [place] [address]   [facility] Health Urgent Care Center [facility]) Get Driving Directions [phone] [address] [place], [place] [address]  [facility] Health Urgent Care Center [facility]) Get Driving Directions [phone] [address] [place],  [place]  [address]  [facility] Health Urgent Care Center [facility] - at [employer]  [phone] [address] [place],  [place] [address]   [facility] Health Urgent Care at [facility] Get Driving Directions [phone] [address], [address] [place], [place] [address]   [facility] Health Urgent Care at [facility] Get Driving Directions  [phone] [address] [place], [place] [address]   [facility] Health Urgent Care at [facility] Directions [phone] [address] [place], [place] [address]  Your MyChart E-visit questionnaire answers were reviewed by a board certified advanced clinical practitioner to complete your personal care plan based on your specific symptoms.  Thank you for using e-Visits.

## 2023-07-06 ENCOUNTER — Telehealth: Payer: Self-pay | Admitting: Family Medicine

## 2023-07-06 NOTE — Telephone Encounter (Signed)
Copied from CRM [id number]. Topic: Clinical - Request for Lab/Test Order >> Jul 06, 2023 12:32 PM Clayton Bibles wrote: Reason for CRM: Marcia Gonzalez states that Dr. Adrian Blackwater wants labs done and wants the results by her next appointment on Dec. 23. Please send lab orders to [facility]. She would like to go this week. Please call her at [phone] or message My Chart.

## 2023-07-06 NOTE — Telephone Encounter (Signed)
Yes

## 2023-07-06 NOTE — Telephone Encounter (Signed)
Pt has been notified - lab apt made

## 2023-07-07 ENCOUNTER — Other Ambulatory Visit: Payer: MEDICAID

## 2023-07-08 ENCOUNTER — Other Ambulatory Visit: Payer: MEDICAID

## 2023-07-12 ENCOUNTER — Encounter: Payer: Self-pay | Admitting: Family Medicine

## 2023-07-12 ENCOUNTER — Ambulatory Visit (INDEPENDENT_AMBULATORY_CARE_PROVIDER_SITE_OTHER): Payer: MEDICAID | Admitting: Family Medicine

## 2023-07-12 DIAGNOSIS — Z7689 Persons encountering health services in other specified circumstances: Secondary | ICD-10-CM

## 2023-07-12 DIAGNOSIS — L659 Nonscarring hair loss, unspecified: Secondary | ICD-10-CM

## 2023-07-12 NOTE — Progress Notes (Signed)
Subjective: CC: Weight check PCP: Raliegh Ip, DO [id number] Huling is a 27 y.o. female presenting to clinic today for:  1.  Morbid obesity Started on semaglutide.  Up to 1.7 mg weekly.  She has had some abdominal pain, nausea and vomiting.  She was seen by gastroenterology and they actually recommend that she hold her semaglutide while she was being worked up.  She had a negative H. pylori test.  She is found to have some gallstones and was actually seen by general surgery who had plan for cholecystectomy but she had put that on pause this year.  She is the about going back and seeing that general surgeon because she thinks this is the etiology of her symptoms and just wants it removed.  She does report that her bowel movements have gotten better on the semaglutide.  She is trying to hydrate and eat adequate protein but her husband, who is here for today's visit, often states that she skips meals and she has not eaten all day long by the time he comes home to see her She has had some hair loss and her psychiatrist request some labs to be drawn  ROS: Per HPI  Allergies  Allergen Reactions   Codeine Nausea Only   Amoxicillin-Pot Clavulanate Other (See Comments)    Hallucinations   Latex Other (See Comments)    Gets Blisters   Misoprostol     Left blisters in mouth   Oxycodone Hives and Itching    Tylenol 3   Pollen Extract Other (See Comments)   Other Rash    Latex tape, causes reaction and blisters.   Past Medical History:  Diagnosis Date   ADHD (attention deficit hyperactivity disorder)    Akathisia 10/05/2022   Allergy    Anemia    Anxiety    Arthritis    Asthma    Chronic hypertension affecting pregnancy 12/03/2021   Dx 12/03/2021 (by today's BP + Joseph Pierini values); rx Labetalol 200mg  bid; growth q 4wks; 2x/wk testing @ 32wks; IOL 37-39wk   Constipation 08/14/2022   Depression    Encounter for supervision of high risk pregnancy in second trimester, antepartum  10/21/2021         FAMILY TREE     RESULTS  Language  English  Pap  (needs postpartum)  Initiated care at  13wks  GC/CT  Initial:  -/-          36wks:  Dating by  6wk [place]        Support person     Genetics  NT/IT: neg/neg    AFP:      Panorama: low risk female  BP cuff  rx'd  Carrier Screen  Neg 05/01/21        [place]/Hgb Elec     Rhogam  n/a        TDaP vaccine  02/11/22  Blood Type  O/Positive/-- (04/04 1619)  Flu v   Endometriosis    GERD (gastroesophageal reflux disease)    History of borderline personality disorder    Hypertension    gestational   Hypothyroidism    IIH (idiopathic intracranial hypertension)    Insomnia 10/23/2022   Missed periods 09/22/2022   Polypharmacy 10/13/2022   Pregnancy examination or test, negative result 09/22/2022   Pregnancy induced hypertension    PTSD (post-traumatic stress disorder)    Suicidal ideation 08/07/2022   UTI (urinary tract infection)    Vaginal delivery 04/05/2022   Vitamin D deficiency 09/08/2022  Wells' syndrome     Current Outpatient Medications:    atomoxetine (STRATTERA) 40 MG capsule, Take 1 tablet by mouth for one week. Then increase to two tablets daily thereafter., Disp: 60 capsule, Rfl: 2   calcium carbonate (TUMS - DOSED IN MG ELEMENTAL CALCIUM) 500 MG chewable tablet, Chew 1 tablet by mouth 2 (two) times daily., Disp: , Rfl:    citalopram (CELEXA) 40 MG tablet, TAKE 1 TABLET BY MOUTH EVERY DAY, Disp: 90 tablet, Rfl: 0   fluticasone (FLONASE) 50 MCG/ACT nasal spray, Place 1 spray into both nostrils daily., Disp: , Rfl:    lamoTRIgine (LAMICTAL) 25 MG tablet, Take 2 tablets (50 mg total) by mouth at bedtime., Disp: 60 tablet, Rfl: 2   levonorgestrel (MIRENA, 52 MG,) 20 MCG/DAY IUD, by Intrauterine route once., Disp: , Rfl:    ondansetron (ZOFRAN-ODT) 4 MG disintegrating tablet, Take 1 tablet (4 mg total) by mouth every 8 (eight) hours as needed for nausea or vomiting., Disp: 20 tablet, Rfl: 0   pantoprazole (PROTONIX) 40 MG tablet,  Take 1 tablet (40 mg total) by mouth daily as needed., Disp: 90 tablet, Rfl: 3   Semaglutide-Weight Management 1.7 MG/0.75ML SOAJ, Inject 1.7 mg into the skin once a week for 28 days., Disp: 3 mL, Rfl: 0   [START ON 08/07/2023] Semaglutide-Weight Management 2.4 MG/0.75ML SOAJ, Inject 2.4 mg into the skin once a week., Disp: 9 mL, Rfl: 3   SYNTHROID 88 MCG tablet, Take 1 tablet (88 mcg total) by mouth daily before breakfast., Disp: 90 tablet, Rfl: 3 Social History   Socioeconomic History   Marital status: Married    Spouse name: Jeri Modena   Number of children: 1   Years of education: 12   Highest education level: High school graduate  Occupational History   Not on file  Tobacco Use   Smoking status: Never    Passive exposure: Never   Smokeless tobacco: Never  Vaping Use   Vaping status: Never Used  Substance and Sexual Activity   Alcohol use: Not Currently   Drug use: Never   Sexual activity: Yes    Birth control/protection: None, Condom  Other Topics Concern   Not on file  Social History Narrative   Not on file   Social Drivers of Health   Financial Resource Strain: Low Risk  (04/13/2022)   Overall Financial Resource Strain (CARDIA)    Difficulty of Paying Living Expenses: Not very hard  Food Insecurity: No Food Insecurity (04/13/2022)   Hunger Vital Sign    Worried About Running Out of Food in the Last Year: Never true    Ran Out of Food in the Last Year: Never true  Transportation Needs: No Transportation Needs (04/13/2022)   PRAPARE - Administrator, Civil Service (Medical): No    Lack of Transportation (Non-Medical): No  Physical Activity: Insufficiently Active (04/13/2022)   Exercise Vital Sign    Days of Exercise per Week: 1 day    Minutes of Exercise per Session: 10 min  Stress: No Stress Concern Present (04/13/2022)   [employer] of Occupational Health - Occupational Stress Questionnaire    Feeling of Stress : Only a little  Social Connections:  Unknown (11/09/2022)   [facility]   Social Network    Social Network: Not on file  Intimate Partner Violence: Not At Risk (07/08/2023)   [facility]   HITS    Over the last 12 months how often did  your partner physically hurt you?: Never    Over the last 12 months how often did your partner insult you or talk down to you?: Never    Over the last 12 months how often did your partner threaten you with physical harm?: Never    Over the last 12 months how often did your partner scream or curse at you?: Never   Family History  Problem Relation Age of Onset   COPD Maternal Grandmother    Arthritis Maternal Grandmother    Depression Father    Anxiety disorder Father    Kidney disease Mother    Hypertension Mother    Hyperlipidemia Mother    Depression Mother    Cancer Mother        cervical   Arthritis Mother    Anxiety disorder Mother    Asthma Brother    Vesicoureteral reflux Son     Objective: Office vital signs reviewed. BP 123/82   Pulse (!) 115   Temp 98.8 F (37.1 C)   Ht 5\' 5"  (1.651 m)   Wt 222 lb (100.7 kg)   SpO2 96%   BMI 36.94 kg/m   Physical Examination:  General: Awake, alert, well nourished, No acute distress HEENT: sclera white, MMM Cardio: regular rate and rhythm, S1S2 heard, no murmurs appreciated Pulm: clear to auscultation bilaterally, no wheezes, rhonchi or rales; normal work of breathing on room air  Assessment/ Plan: 27 y.o. female   Morbid obesity (HCC)  Encounter for weight management  Hair loss - Plan: Folate, Vitamin B12, VITAMIN D 25 Hydroxy (Vit-D Deficiency, Fractures)  She has lost 25 pounds so far on the medication and seems to be tolerating it well.  I do wonder if perhaps she has an organic gallbladder issue.  She of course would have to hold this medical type before any surgery.  I encouraged her to continue to follow-up with gastroenterology but I have given her information to speak  to her general surgeon again as the plan previously had been to remove the gallbladder in its entirety.  We will order the labs that were requested by her psychiatrist to further evaluate hair loss.  However, I suspect that some of the hair loss may be compounded by weight loss which is very typical   Raliegh Ip, DO [facility] [phone]

## 2023-07-13 LAB — VITAMIN D 25 HYDROXY (VIT D DEFICIENCY, FRACTURES): Vit D, 25-Hydroxy: 18.5 ng/mL — ABNORMAL LOW (ref 30.0–100.0)

## 2023-07-13 LAB — VITAMIN B12: Vitamin B-12: 353 pg/mL (ref 232–1245)

## 2023-07-13 LAB — FOLATE: Folate: 11.1 ng/mL (ref >3.0)

## 2023-07-16 ENCOUNTER — Other Ambulatory Visit: Payer: Self-pay | Admitting: Family Medicine

## 2023-07-16 DIAGNOSIS — E559 Vitamin D deficiency, unspecified: Secondary | ICD-10-CM

## 2023-07-16 MED ORDER — VITAMIN D (ERGOCALCIFEROL) 1.25 MG (50000 UNIT) PO CAPS: 50000.0000 [IU] | ORAL_CAPSULE | ORAL | 3 refills | Status: DC

## 2023-07-28 ENCOUNTER — Other Ambulatory Visit: Payer: Self-pay | Admitting: Family Medicine

## 2023-07-28 ENCOUNTER — Encounter: Payer: Self-pay | Admitting: Family Medicine

## 2023-07-28 DIAGNOSIS — Z7689 Persons encountering health services in other specified circumstances: Secondary | ICD-10-CM

## 2023-07-28 MED ORDER — WEGOVY 1 MG/0.5ML ~~LOC~~ SOAJ: 1.0000 mg | SUBCUTANEOUS | 1 refills | Status: DC

## 2023-07-28 MED ORDER — ONDANSETRON 4 MG PO TBDP: 4.0000 mg | ORAL_TABLET | Freq: Three times a day (TID) | ORAL | 0 refills | Status: DC | PRN

## 2023-07-29 ENCOUNTER — Encounter: Payer: Self-pay | Admitting: General Surgery

## 2023-07-29 ENCOUNTER — Ambulatory Visit (INDEPENDENT_AMBULATORY_CARE_PROVIDER_SITE_OTHER): Payer: Commercial Managed Care - PPO | Admitting: General Surgery

## 2023-07-29 VITALS — BP 127/92 | HR 110 | Temp 98.0°F | Resp 16 | Ht 65.0 in | Wt 220.0 lb

## 2023-07-29 DIAGNOSIS — K8020 Calculus of gallbladder without cholecystitis without obstruction: Secondary | ICD-10-CM | POA: Diagnosis not present

## 2023-07-29 NOTE — Progress Notes (Signed)
 [facility] History and Physical  Reason for Referral: Gallstones  Referring Physician: Self referral   Chief Complaint   New Patient (Initial Visit)     Marcia Gonzalez is a 28 y.o. female.  HPI: Marcia Gonzalez is a very sweet woman who has had 2 major attacks of her gallbladder she believes. She had one attack in 2023 that resulted in an US  with polyps versus stones and this latest attack has resulted in a large workup with GI. US  shows stones and sludge and a fatty focal area on her liver, as well as EGD with a medium hiatal hernia per her report, negative, H pylori and stool studies. She says the pain is in the RUQ and causes significant nausea/vomiting. She was on Wegovy  around this same time and has since decreased her dose with less nausea.  She continues to have pain in the RUQ and into her back and right shoulder.    Past Medical History:  Diagnosis Date   ADHD (attention deficit hyperactivity disorder)    Akathisia 10/05/2022   Allergy    Anemia    Anxiety    Arthritis    Asthma    Chronic hypertension affecting pregnancy 12/03/2021   Dx 12/03/2021 (by today's BP + ^home values); rx Labetalol  200mg  bid; growth q 4wks; 2x/wk testing @ 32wks; IOL 37-39wk   Constipation 08/14/2022   Depression    Encounter for supervision of high risk pregnancy in second trimester, antepartum 10/21/2021         FAMILY TREE     RESULTS  Language  English  Pap  (needs postpartum)  Initiated care at  13wks  GC/CT  Initial:  -/-          36wks:  Dating by  6wk US         Support person     Genetics  NT/IT: neg/neg    AFP:      Panorama: low risk female  BP cuff  rx'd  Carrier Screen  Neg 05/01/21        [place]/Hgb Elec     Rhogam  n/a        TDaP vaccine  02/11/22  Blood Type  O/Positive/-- (04/04 1619)  Flu v   Endometriosis    GERD (gastroesophageal reflux disease)    History of borderline personality disorder    Hypertension    gestational   Hypothyroidism    IIH (idiopathic intracranial  hypertension)    Insomnia 10/23/2022   Missed periods 09/22/2022   Polypharmacy 10/13/2022   Pregnancy examination or test, negative result 09/22/2022   Pregnancy induced hypertension    PTSD (post-traumatic stress disorder)    Suicidal ideation 08/07/2022   UTI (urinary tract infection)    Vaginal delivery 04/05/2022   Vitamin D  deficiency 09/08/2022   Wells' syndrome     Past Surgical History:  Procedure Laterality Date   ABDOMINAL EXPLORATION SURGERY  04/2021   COLONOSCOPY     DILATION AND CURETTAGE OF UTERUS     FINGER SURGERY     LAPAROTOMY     LUMBAR PUNCTURE      Family History  Problem Relation Age of Onset   COPD Maternal Grandmother    Arthritis Maternal Grandmother    Depression Father    Anxiety disorder Father    Kidney disease Mother    Hypertension Mother    Hyperlipidemia Mother    Depression Mother    Cancer Mother        cervical  Arthritis Mother    Anxiety disorder Mother    Asthma Brother    Vesicoureteral reflux Son     Social History   Tobacco Use   Smoking status: Never    Passive exposure: Never   Smokeless tobacco: Never  Vaping Use   Vaping status: Never Used  Substance Use Topics   Alcohol use: Not Currently   Drug use: Never    Medications: I have reviewed the patient's current medications. Allergies as of 07/29/2023       Reactions   Codeine Nausea Only   Amoxicillin-pot Clavulanate Other (See Comments)   Hallucinations   Latex Other (See Comments)   Gets Blisters   Misoprostol     Left blisters in mouth   Oxycodone  Hives, Itching   Tylenol  3   Pollen Extract Other (See Comments)   Other Rash   Latex tape, causes reaction and blisters.        Medication List        Accurate as of July 29, 2023  2:16 PM. If you have any questions, ask your nurse or doctor.          aluminum hydroxide-magnesium  carbonate 95-358 MG/15ML Susp Commonly known as: GAVISCON Take by mouth.   atomoxetine  40 MG  capsule Commonly known as: Strattera  Take 1 tablet by mouth for one week. Then increase to two tablets daily thereafter.   calcium carbonate 500 MG chewable tablet Commonly known as: TUMS - dosed in mg elemental calcium Chew 1 tablet by mouth 2 (two) times daily.   citalopram  40 MG tablet Commonly known as: CELEXA  TAKE 1 TABLET BY MOUTH EVERY DAY   famotidine  20 MG tablet Commonly known as: PEPCID  Take by mouth.   lamoTRIgine  25 MG tablet Commonly known as: LAMICTAL  Take 2 tablets (50 mg total) by mouth at bedtime.   Mirena (52 MG) 20 MCG/DAY Iud Generic drug: levonorgestrel by Intrauterine route once.   ondansetron  4 MG disintegrating tablet Commonly known as: ZOFRAN -ODT Take 1 tablet (4 mg total) by mouth every 8 (eight) hours as needed for nausea or vomiting.   pantoprazole  40 MG tablet Commonly known as: PROTONIX  Take 1 tablet (40 mg total) by mouth daily as needed.   promethazine  25 MG suppository Commonly known as: PHENERGAN  SMARTSIG:0.5 SUPPOS Rectally Every 6 Hours PRN   Synthroid  88 MCG tablet Generic drug: levothyroxine  Take 1 tablet (88 mcg total) by mouth daily before breakfast.   Synthroid  88 MCG tablet Generic drug: levothyroxine  Take 1 tablet by mouth daily before breakfast.   Vitamin D  (Ergocalciferol ) 1.25 MG (50000 UNIT) Caps capsule Commonly known as: DRISDOL  Take 1 capsule (50,000 Units total) by mouth every 7 (seven) days.   Wegovy  1 MG/0.5ML Soaj Generic drug: Semaglutide -Weight Management Inject 1 mg into the skin every 7 (seven) days. Dose reduction What changed: Another medication with the same name was removed. Continue taking this medication, and follow the directions you see here. Changed by: Manuelita JAYSON Pander         ROS:  A comprehensive review of systems was negative except for: Gastrointestinal: positive for abdominal pain, nausea, reflux symptoms, and vomiting Musculoskeletal: positive for joint pain  Blood pressure (!)  127/92, pulse (!) 110, temperature 98 F (36.7 C), temperature source Oral, resp. rate 16, height 5' 5 (1.651 m), weight 220 lb (99.8 kg), SpO2 97%, not currently breastfeeding. Physical Exam Vitals reviewed.  HENT:     Head: Normocephalic.     Mouth/Throat:     Mouth: Mucous  membranes are moist.  Eyes:     Extraocular Movements: Extraocular movements intact.  Cardiovascular:     Rate and Rhythm: Normal rate and regular rhythm.  Pulmonary:     Effort: Pulmonary effort is normal.     Breath sounds: Normal breath sounds.  Abdominal:     General: There is no distension.     Palpations: Abdomen is soft.     Tenderness: There is abdominal tenderness in the right upper quadrant.  Musculoskeletal:        General: Normal range of motion.     Cervical back: Normal range of motion.  Skin:    General: Skin is warm.  Neurological:     General: No focal deficit present.     Mental Status: She is alert.    Results: reviewed reports- gallstones on most recent, focal fatty lesion on liver and hemangioma  CLINICAL DATA:  Pain right upper quadrant, vomiting   EXAM: ULTRASOUND ABDOMEN LIMITED RIGHT UPPER QUADRANT   COMPARISON:  None Available.   FINDINGS: Gallbladder:   There is 6 mm hyperechoic structure without definite acoustic shadowing in the lumen of gallbladder. There is 8 mm echogenic structure in the nondependent portion of gallbladder wall, possibly a polyp. Technologist did not observe any tenderness over the gallbladder. There is no significant wall thickening in gallbladder. There is no fluid around the gallbladder.   Common bile duct:   Diameter: 4.5 mm. Distal common bile duct is obscured. There is no dilation of intrahepatic bile ducts.   Liver:   There is increased echogenicity in liver. No focal abnormalities are seen. portal vein is patent on color Doppler imaging with normal direction of blood flow towards the liver.   Other: None.   IMPRESSION: There is  6 mm hyperechoic focus without definite acoustic shadowing in gallbladder suggesting possible stone or polyp. There are few other echogenic foci in nondependent portion of gallbladder wall suggesting polyps measuring up to 8 mm. There are no signs of acute cholecystitis.   Fatty liver.     Electronically Signed   By: Gearldine Mary M.D.   On: 05/05/2022 10:14  TECHNIQUE: Complete abdominal ultrasound.  COMPARISON: None.   INDICATION: vomiting, rule out gallbladder disease.SABRA   FINDINGS: Hyperechoic lesions in the liver, one measuring 3.2 cm near the fissure for ligamentum teres, possibly focal fat, and the other measuring 1.5 x 1.3 x 1.5 cm in the right hepatic lobe, possibly a hemangioma. Cholelithiasis and gallbladder sludge. No biliary dilatation with 4 mm CBD. Normal pancreas, spleen, kidneys, aorta, and IVC. The portal vein is patent with hepatopetal flow. Incidental 4.9 cm cystic lesion in the left lower quadrant, question ovarian.    IMPRESSION:  1. Liver lesions as above. Dedicated hepatic MRI to further evaluate.  2. Cholelithiasis and gallbladder sludge  3. Incidental 4.9 cm left lower quadrant cystic lesion, possibly ovarian. Pelvic ultrasound if indicated aided.   Electronically Signed by: Sheppard Charm, MD   Assessment & Plan:  Marcia Gonzalez is a 28 y.o. female with RUQ pain and nausea/vomiting. Discussed that multiple things can lead to nausea and vomiting but hopefully with the RUQ pain this is mostly her gallbladder. Discussed that if her symptoms don't resolve she can continue to work with GI.   PLAN: I counseled the patient about the indication, risks and benefits of robotic assisted laparoscopic cholecystectomy.  She understands there is a very small chance for bleeding, infection, injury to normal structures (including common bile duct), conversion to open  surgery, persistent symptoms, evolution of postcholecystectomy diarrhea, need for secondary interventions,  anesthesia reaction, cardiopulmonary issues and other risks not specifically detailed here. I described the expected recovery, the plan for follow-up and the restrictions during the recovery phase.  All questions were answered.  She knows to hold her Wegovy  before surgery.   All questions were answered to the satisfaction of the patient and family. ;   Manuelita JAYSON Pander 07/29/2023, 2:16 PM

## 2023-07-29 NOTE — Patient Instructions (Addendum)
 Do not take your Wegovy  on Monday 08/02/2023. Plan for Surgery 08/05/2023.   Robotic Assisted Minimally Invasive Cholecystectomy Minimally invasive cholecystectomy is surgery to remove the gallbladder. The gallbladder is a pear-shaped organ that lies beneath the liver on the right side of the body. The gallbladder stores bile, which is a fluid that helps the body digest fats. Cholecystectomy is often done to treat inflammation (irritation and swelling) of the gallbladder (cholecystitis). This condition is usually caused by a buildup of gallstones (cholelithiasis) in the gallbladder or when the fluid in the gall bladder becomes stagnant because gallstones get stuck in the ducts (tubes) and block the flow of bile. This can result in inflammation and pain. In severe cases, emergency surgery may be required. This procedure is done through small incisions in the abdomen, instead of one large incision. It is also called laparoscopic surgery. A thin scope with a camera (laparoscope) is inserted through one incision. Then surgical instruments are inserted through the other incisions. In some cases, a minimally invasive surgery may need to be changed to a surgery that is done through a larger incision. This is called open surgery. Tell a health care provider about: Any allergies you have. All medicines you are taking, including vitamins, herbs, eye drops, creams, and over-the-counter medicines. Any problems you or family members have had with anesthetic medicines. Any bleeding problems you have. Any surgeries you have had. Any medical conditions you have. Whether you are pregnant or may be pregnant. What are the risks? Generally, this is a safe procedure. However, problems may occur, including: Infection. Bleeding. Allergic reactions to medicines. Damage to nearby structures or organs. A gallstone remaining in the common bile duct. The common bile duct carries bile from the gallbladder to the small  intestine. A bile leak from the liver or cystic duct after your gallbladder is removed. What happens before the procedure? Medicines Ask your health care provider about: Changing or stopping your regular medicines. This is especially important if you are taking diabetes medicines or blood thinners. Taking medicines such as aspirin  and ibuprofen . These medicines can thin your blood. Do not take these medicines unless your health care provider tells you to take them. Taking over-the-counter medicines, vitamins, herbs, and supplements. General instructions If you will be going home right after the procedure, plan to have a responsible adult: Take you home from the hospital or clinic. You will not be allowed to drive. Care for you for the time you are told. Do not use any products that contain nicotine or tobacco for at least 4 weeks before the procedure. These products include cigarettes, chewing tobacco, and vaping devices, such as e-cigarettes. If you need help quitting, ask your health care provider. Ask your health care provider: How your surgery site will be marked. What steps will be taken to help prevent infection. These may include: Removing hair at the surgery site. Washing skin with a germ-killing soap. Taking antibiotic medicine. What happens during the procedure?  An IV will be inserted into one of your veins. You will be given one or both of the following: A medicine to help you relax (sedative). A medicine to make you fall asleep (general anesthetic). Your surgeon will make several small incisions in your abdomen. The laparoscope will be inserted through one of the small incisions. The camera on the laparoscope will send images to a monitor in the operating room. This lets your surgeon see inside your abdomen. A gas will be pumped into your abdomen. This will  expand your abdomen to give the surgeon more room to perform the surgery. Other tools that are needed for the procedure  will be inserted through the other incisions. The gallbladder will be removed through one of the incisions. Your common bile duct may be examined. If stones are found in the common bile duct, they may be removed. After your gallbladder has been removed, the incisions will be closed with stitches (sutures), staples, or skin glue. Your incisions will be covered with a bandage (dressing). The procedure may vary among health care providers and hospitals. What happens after the procedure? Your blood pressure, heart rate, breathing rate, and blood oxygen  level will be monitored until you leave the hospital or clinic. You will be given medicines as needed to control your pain. You may have a drain placed in the incision. The drain will be removed a day or two after the procedure. Summary Minimally invasive cholecystectomy, also called laparoscopic cholecystectomy, is surgery to remove the gallbladder using small incisions. Tell your health care provider about all the medical conditions you have and all the medicines you are taking for those conditions. Before the procedure, follow instructions about when to stop eating and drinking and changing or stopping medicines. Plan to have a responsible adult care for you for the time you are told after you leave the hospital or clinic. This information is not intended to replace advice given to you by your health care provider. Make sure you discuss any questions you have with your health care provider. Document Revised: 01/07/2021 Document Reviewed: 01/07/2021 Elsevier Patient Education  2024 [employer].

## 2023-08-02 ENCOUNTER — Encounter (HOSPITAL_COMMUNITY)
Admission: RE | Admit: 2023-08-02 | Discharge: 2023-08-02 | Disposition: A | Discharge status: Home / Self Care | Payer: Commercial Managed Care - PPO | Source: Ambulatory Visit | Attending: General Surgery | Admitting: General Surgery

## 2023-08-02 ENCOUNTER — Other Ambulatory Visit: Payer: Self-pay

## 2023-08-02 ENCOUNTER — Encounter (HOSPITAL_COMMUNITY): Payer: Self-pay

## 2023-08-02 VITALS — Ht 65.0 in | Wt 220.0 lb

## 2023-08-02 DIAGNOSIS — Z01818 Encounter for other preprocedural examination: Secondary | ICD-10-CM

## 2023-08-02 HISTORY — DX: Diaphragmatic hernia without obstruction or gangrene: K44.9

## 2023-08-02 NOTE — H&P (Signed)
 [facility] History and Physical   Reason for Referral: Gallstones  Referring Physician: Self referral    Chief Complaint   New Patient (Initial Visit)        Marcia Gonzalez is a 28 y.o. female.  HPI: Marcia Gonzalez is a very sweet woman who has had 2 major attacks of her gallbladder she believes. She had one attack in 2023 that resulted in an US  with polyps versus stones and this latest attack has resulted in a large workup with GI. US  shows stones and sludge and a fatty focal area on her liver, as well as EGD with a medium hiatal hernia per her report, negative, H pylori and stool studies. She says the pain is in the RUQ and causes significant nausea/vomiting. She was on Wegovy  around this same time and has since decreased her dose with less nausea.  She continues to have pain in the RUQ and into her back and right shoulder.          Past Medical History:  Diagnosis Date   ADHD (attention deficit hyperactivity disorder)     Akathisia 10/05/2022   Allergy     Anemia     Anxiety     Arthritis     Asthma     Chronic hypertension affecting pregnancy 12/03/2021    Dx 12/03/2021 (by today's BP + ^home values); rx Labetalol  200mg  bid; growth q 4wks; 2x/wk testing @ 32wks; IOL 37-39wk   Constipation 08/14/2022   Depression     Encounter for supervision of high risk pregnancy in second trimester, antepartum 10/21/2021        FAMILY TREE     RESULTS  Language  English  Pap  (needs postpartum)  Initiated care at  13wks  GC/CT  Initial:  -/-          36wks:  Dating by  6wk US       Support person     Genetics  NT/IT: neg/neg    AFP:      Panorama: low risk female  BP cuff  rx'd  Carrier Screen  Neg 05/01/21      [place]/Hgb Elec     Rhogam  n/a      TDaP vaccine  02/11/22  Blood Type  O/Positive/-- (04/04 1619)  Flu v   Endometriosis     GERD (gastroesophageal reflux disease)     History of borderline personality disorder     Hypertension      gestational   Hypothyroidism     IIH  (idiopathic intracranial hypertension)     Insomnia 10/23/2022   Missed periods 09/22/2022   Polypharmacy 10/13/2022   Pregnancy examination or test, negative result 09/22/2022   Pregnancy induced hypertension     PTSD (post-traumatic stress disorder)     Suicidal ideation 08/07/2022   UTI (urinary tract infection)     Vaginal delivery 04/05/2022   Vitamin D  deficiency 09/08/2022   Wells' syndrome                 Past Surgical History:  Procedure Laterality Date   ABDOMINAL EXPLORATION SURGERY   04/2021   COLONOSCOPY       DILATION AND CURETTAGE OF UTERUS       FINGER SURGERY       LAPAROTOMY       LUMBAR PUNCTURE                   Family History  Problem Relation Age of Onset  COPD Maternal Grandmother     Arthritis Maternal Grandmother     Depression Father     Anxiety disorder Father     Kidney disease Mother     Hypertension Mother     Hyperlipidemia Mother     Depression Mother     Cancer Mother          cervical   Arthritis Mother     Anxiety disorder Mother     Asthma Brother     Vesicoureteral reflux Son            Social History  Social History         Tobacco Use   Smoking status: Never      Passive exposure: Never   Smokeless tobacco: Never  Vaping Use   Vaping status: Never Used  Substance Use Topics   Alcohol use: Not Currently   Drug use: Never        Medications: I have reviewed the patient's current medications. Allergies as of 07/29/2023         Reactions    Codeine Nausea Only    Amoxicillin-pot Clavulanate Other (See Comments)    Hallucinations    Latex Other (See Comments)    Gets Blisters    Misoprostol       Left blisters in mouth    Oxycodone  Hives, Itching    Tylenol  3    Pollen Extract Other (See Comments)    Other Rash    Latex tape, causes reaction and blisters.            Medication List           Accurate as of July 29, 2023  2:16 PM. If you have any questions, ask your nurse or doctor.               aluminum hydroxide-magnesium  carbonate 95-358 MG/15ML Susp Commonly known as: GAVISCON Take by mouth.    atomoxetine  40 MG capsule Commonly known as: Strattera  Take 1 tablet by mouth for one week. Then increase to two tablets daily thereafter.    calcium carbonate 500 MG chewable tablet Commonly known as: TUMS - dosed in mg elemental calcium Chew 1 tablet by mouth 2 (two) times daily.    citalopram  40 MG tablet Commonly known as: CELEXA  TAKE 1 TABLET BY MOUTH EVERY DAY    famotidine  20 MG tablet Commonly known as: PEPCID  Take by mouth.    lamoTRIgine  25 MG tablet Commonly known as: LAMICTAL  Take 2 tablets (50 mg total) by mouth at bedtime.    Mirena (52 MG) 20 MCG/DAY Iud Generic drug: levonorgestrel by Intrauterine route once.    ondansetron  4 MG disintegrating tablet Commonly known as: ZOFRAN -ODT Take 1 tablet (4 mg total) by mouth every 8 (eight) hours as needed for nausea or vomiting.    pantoprazole  40 MG tablet Commonly known as: PROTONIX  Take 1 tablet (40 mg total) by mouth daily as needed.    promethazine  25 MG suppository Commonly known as: PHENERGAN  SMARTSIG:0.5 SUPPOS Rectally Every 6 Hours PRN    Synthroid  88 MCG tablet Generic drug: levothyroxine  Take 1 tablet (88 mcg total) by mouth daily before breakfast.    Synthroid  88 MCG tablet Generic drug: levothyroxine  Take 1 tablet by mouth daily before breakfast.    Vitamin D  (Ergocalciferol ) 1.25 MG (50000 UNIT) Caps capsule Commonly known as: DRISDOL  Take 1 capsule (50,000 Units total) by mouth every 7 (seven) days.    Wegovy  1 MG/0.5ML Soaj Generic drug: Semaglutide -Weight Management  Inject 1 mg into the skin every 7 (seven) days. Dose reduction What changed: Another medication with the same name was removed. Continue taking this medication, and follow the directions you see here. Changed by: Manuelita JAYSON Pander               ROS:  A comprehensive review of systems was negative except for:  Gastrointestinal: positive for abdominal pain, nausea, reflux symptoms, and vomiting Musculoskeletal: positive for joint pain   Blood pressure (!) 127/92, pulse (!) 110, temperature 98 F (36.7 C), temperature source Oral, resp. rate 16, height 5' 5 (1.651 m), weight 220 lb (99.8 kg), SpO2 97%, not currently breastfeeding. Physical Exam Vitals reviewed.  HENT:     Head: Normocephalic.     Mouth/Throat:     Mouth: Mucous membranes are moist.  Eyes:     Extraocular Movements: Extraocular movements intact.  Cardiovascular:     Rate and Rhythm: Normal rate and regular rhythm.  Pulmonary:     Effort: Pulmonary effort is normal.     Breath sounds: Normal breath sounds.  Abdominal:     General: There is no distension.     Palpations: Abdomen is soft.     Tenderness: There is abdominal tenderness in the right upper quadrant.  Musculoskeletal:        General: Normal range of motion.     Cervical back: Normal range of motion.  Skin:    General: Skin is warm.  Neurological:     General: No focal deficit present.     Mental Status: She is alert.      Results: reviewed reports- gallstones on most recent, focal fatty lesion on liver and hemangioma  CLINICAL DATA:  Pain right upper quadrant, vomiting   EXAM: ULTRASOUND ABDOMEN LIMITED RIGHT UPPER QUADRANT   COMPARISON:  None Available.   FINDINGS: Gallbladder:   There is 6 mm hyperechoic structure without definite acoustic shadowing in the lumen of gallbladder. There is 8 mm echogenic structure in the nondependent portion of gallbladder wall, possibly a polyp. Technologist did not observe any tenderness over the gallbladder. There is no significant wall thickening in gallbladder. There is no fluid around the gallbladder.   Common bile duct:   Diameter: 4.5 mm. Distal common bile duct is obscured. There is no dilation of intrahepatic bile ducts.   Liver:   There is increased echogenicity in liver. No focal abnormalities  are seen. portal vein is patent on color Doppler imaging with normal direction of blood flow towards the liver.   Other: None.   IMPRESSION: There is 6 mm hyperechoic focus without definite acoustic shadowing in gallbladder suggesting possible stone or polyp. There are few other echogenic foci in nondependent portion of gallbladder wall suggesting polyps measuring up to 8 mm. There are no signs of acute cholecystitis.   Fatty liver.     Electronically Signed   By: Gearldine Mary M.D.   On: 05/05/2022 10:14   TECHNIQUE: Complete abdominal ultrasound.  COMPARISON: None.   INDICATION: vomiting, rule out gallbladder disease.SABRA   FINDINGS: Hyperechoic lesions in the liver, one measuring 3.2 cm near the fissure for ligamentum teres, possibly focal fat, and the other measuring 1.5 x 1.3 x 1.5 cm in the right hepatic lobe, possibly a hemangioma. Cholelithiasis and gallbladder sludge. No biliary dilatation with 4 mm CBD. Normal pancreas, spleen, kidneys, aorta, and IVC. The portal vein is patent with hepatopetal flow. Incidental 4.9 cm cystic lesion in the left lower quadrant, question ovarian.  IMPRESSION:  1. Liver lesions as above. Dedicated hepatic MRI to further evaluate.  2. Cholelithiasis and gallbladder sludge  3. Incidental 4.9 cm left lower quadrant cystic lesion, possibly ovarian. Pelvic ultrasound if indicated aided.   Electronically Signed by: Sheppard Charm, MD    Assessment & Plan:  Marcia Gonzalez is a 28 y.o. female with RUQ pain and nausea/vomiting. Discussed that multiple things can lead to nausea and vomiting but hopefully with the RUQ pain this is mostly her gallbladder. Discussed that if her symptoms don't resolve she can continue to work with GI.    PLAN: I counseled the patient about the indication, risks and benefits of robotic assisted laparoscopic cholecystectomy.  She understands there is a very small chance for bleeding, infection, injury to normal  structures (including common bile duct), conversion to open surgery, persistent symptoms, evolution of postcholecystectomy diarrhea, need for secondary interventions, anesthesia reaction, cardiopulmonary issues and other risks not specifically detailed here. I described the expected recovery, the plan for follow-up and the restrictions during the recovery phase.  All questions were answered.   She knows to hold her Wegovy  before surgery.    All questions were answered to the satisfaction of the patient and family.      Manuelita JAYSON Pander 07/29/2023, 2:16 PM

## 2023-08-04 NOTE — Anesthesia Preprocedure Evaluation (Addendum)
Anesthesia Evaluation  Patient identified by MRN, date of birth, ID band Patient awake    Reviewed: Allergy & Precautions, NPO status , Patient's Chart, lab work & pertinent test results, reviewed documented beta blocker date and time   Airway Mallampati: II  TM Distance: >3 FB Neck ROM: Full    Dental no notable dental hx. (+) Teeth Intact, Dental Advisory Given   Pulmonary asthma    Pulmonary exam normal breath sounds clear to auscultation       Cardiovascular hypertension, Pt. on medications and Pt. on home beta blockers Normal cardiovascular exam Rhythm:Regular Rate:Normal     Neuro/Psych  PSYCHIATRIC DISORDERS Anxiety Depression    Borderline personalityIdiopathic intracranial HTN  Neuromuscular disease    GI/Hepatic Neg liver ROS, hiatal hernia,GERD  Medicated,,  Endo/Other  Hypothyroidism  Class 3 obesity  Renal/GU negative Renal ROS  negative genitourinary   Musculoskeletal  (+) Arthritis , Osteoarthritis,    Abdominal  (+) + obese  Peds  Hematology  (+) Blood dyscrasia, anemia   Anesthesia Other Findings   Reproductive/Obstetrics (+) Pregnancy Gestational HTN                             Anesthesia Physical Anesthesia Plan  ASA: 3  Anesthesia Plan: General   Post-op Pain Management: Dilaudid IV   Induction: Intravenous  PONV Risk Score and Plan: 3 and Treatment may vary due to age or medical condition, Ondansetron, Dexamethasone and Midazolam  Airway Management Planned: Oral ETT  Additional Equipment: None  Intra-op Plan:   Post-operative Plan: Extubation in OR  Informed Consent: I have reviewed the patients History and Physical, chart, labs and discussed the procedure including the risks, benefits and alternatives for the proposed anesthesia with the patient or authorized representative who has indicated his/her understanding and acceptance.     Dental advisory  given  Plan Discussed with: CRNA  Anesthesia Plan Comments:         Anesthesia Quick Evaluation

## 2023-08-05 ENCOUNTER — Ambulatory Visit (HOSPITAL_COMMUNITY)
Admission: RE | Admit: 2023-08-05 | Discharge: 2023-08-05 | Disposition: A | Discharge status: Home / Self Care | Payer: Commercial Managed Care - PPO | Attending: General Surgery | Admitting: General Surgery

## 2023-08-05 ENCOUNTER — Ambulatory Visit (HOSPITAL_COMMUNITY): Payer: Commercial Managed Care - PPO | Admitting: Anesthesiology

## 2023-08-05 ENCOUNTER — Encounter (HOSPITAL_COMMUNITY): Payer: Self-pay | Admitting: General Surgery

## 2023-08-05 ENCOUNTER — Ambulatory Visit (HOSPITAL_BASED_OUTPATIENT_CLINIC_OR_DEPARTMENT_OTHER): Payer: Commercial Managed Care - PPO | Admitting: Anesthesiology

## 2023-08-05 ENCOUNTER — Encounter (HOSPITAL_COMMUNITY)
Admission: RE | Disposition: A | Discharge status: Home / Self Care | Payer: Self-pay | Source: Home / Self Care | Attending: General Surgery

## 2023-08-05 DIAGNOSIS — F32A Depression, unspecified: Secondary | ICD-10-CM | POA: Diagnosis not present

## 2023-08-05 DIAGNOSIS — K449 Diaphragmatic hernia without obstruction or gangrene: Secondary | ICD-10-CM | POA: Insufficient documentation

## 2023-08-05 DIAGNOSIS — Z6836 Body mass index (BMI) 36.0-36.9, adult: Secondary | ICD-10-CM | POA: Diagnosis not present

## 2023-08-05 DIAGNOSIS — I10 Essential (primary) hypertension: Secondary | ICD-10-CM | POA: Diagnosis not present

## 2023-08-05 DIAGNOSIS — K8010 Calculus of gallbladder with chronic cholecystitis without obstruction: Secondary | ICD-10-CM | POA: Diagnosis present

## 2023-08-05 DIAGNOSIS — E039 Hypothyroidism, unspecified: Secondary | ICD-10-CM | POA: Insufficient documentation

## 2023-08-05 DIAGNOSIS — E66813 Obesity, class 3: Secondary | ICD-10-CM | POA: Insufficient documentation

## 2023-08-05 DIAGNOSIS — J45909 Unspecified asthma, uncomplicated: Secondary | ICD-10-CM

## 2023-08-05 DIAGNOSIS — K8020 Calculus of gallbladder without cholecystitis without obstruction: Secondary | ICD-10-CM

## 2023-08-05 DIAGNOSIS — K219 Gastro-esophageal reflux disease without esophagitis: Secondary | ICD-10-CM | POA: Insufficient documentation

## 2023-08-05 DIAGNOSIS — Z01818 Encounter for other preprocedural examination: Secondary | ICD-10-CM

## 2023-08-05 DIAGNOSIS — Z7985 Long-term (current) use of injectable non-insulin antidiabetic drugs: Secondary | ICD-10-CM | POA: Diagnosis not present

## 2023-08-05 LAB — POCT PREGNANCY, URINE: Preg Test, Ur: NEGATIVE

## 2023-08-05 SURGERY — CHOLECYSTECTOMY, ROBOT-ASSISTED, LAPAROSCOPIC
Anesthesia: General | Site: Abdomen

## 2023-08-05 MED ORDER — ROCURONIUM BROMIDE 10 MG/ML (PF) SYRINGE
PREFILLED_SYRINGE | INTRAVENOUS | Status: AC
  Filled 2023-08-05: qty 10

## 2023-08-05 MED ORDER — TRAMADOL HCL 50 MG PO TABS: 50.0000 mg | ORAL_TABLET | Freq: Four times a day (QID) | ORAL | 0 refills | Status: DC | PRN

## 2023-08-05 MED ORDER — DIPHENHYDRAMINE HCL 50 MG/ML IJ SOLN
25.0000 mg | Freq: Four times a day (QID) | INTRAMUSCULAR | Status: DC | PRN
  Administered 2023-08-05 (×2): 25 mg via INTRAVENOUS

## 2023-08-05 MED ORDER — CHLORHEXIDINE GLUCONATE CLOTH 2 % EX PADS: 6.0000 | MEDICATED_PAD | Freq: Once | CUTANEOUS | Status: DC

## 2023-08-05 MED ORDER — DIPHENHYDRAMINE HCL 50 MG/ML IJ SOLN
INTRAMUSCULAR | Status: AC
  Administered 2023-08-05: 50 mg via INTRAVENOUS
  Filled 2023-08-05: qty 1

## 2023-08-05 MED ORDER — DIPHENHYDRAMINE HCL 50 MG/ML IJ SOLN: 50.0000 mg | Freq: Once | INTRAMUSCULAR | Status: AC

## 2023-08-05 MED ORDER — HYDROMORPHONE HCL 1 MG/ML IJ SOLN
0.2500 mg | INTRAMUSCULAR | Status: DC | PRN
  Administered 2023-08-05 (×3): 0.5 mg via INTRAVENOUS
  Filled 2023-08-05 (×3): qty 0.5

## 2023-08-05 MED ORDER — ONDANSETRON HCL 4 MG PO TABS: 4.0000 mg | ORAL_TABLET | Freq: Three times a day (TID) | ORAL | 1 refills | Status: DC | PRN

## 2023-08-05 MED ORDER — ROCURONIUM BROMIDE 100 MG/10ML IV SOLN
INTRAVENOUS | Status: DC | PRN
  Administered 2023-08-05: 70 mg via INTRAVENOUS

## 2023-08-05 MED ORDER — INDOCYANINE GREEN 25 MG IV SOLR
INTRAVENOUS | Status: AC
  Administered 2023-08-05: 2.5 mg via INTRAVENOUS
  Filled 2023-08-05: qty 10

## 2023-08-05 MED ORDER — METHYLPREDNISOLONE SODIUM SUCC 125 MG IJ SOLR
125.0000 mg | Freq: Once | INTRAMUSCULAR | Status: AC
  Administered 2023-08-05: 125 mg via INTRAVENOUS

## 2023-08-05 MED ORDER — METHYLPREDNISOLONE SODIUM SUCC 125 MG IJ SOLR
INTRAMUSCULAR | Status: AC
  Filled 2023-08-05: qty 2

## 2023-08-05 MED ORDER — DEXMEDETOMIDINE HCL IN NACL 200 MCG/50ML IV SOLN
0.0000 ug/kg/h | INTRAVENOUS | Status: DC
  Filled 2023-08-05: qty 50

## 2023-08-05 MED ORDER — ORAL CARE MOUTH RINSE: 15.0000 mL | Freq: Once | OROMUCOSAL | Status: AC

## 2023-08-05 MED ORDER — OXYCODONE HCL 5 MG PO TABS: 5.0000 mg | ORAL_TABLET | ORAL | 0 refills | Status: DC | PRN

## 2023-08-05 MED ORDER — ONDANSETRON HCL 4 MG/2ML IJ SOLN
INTRAMUSCULAR | Status: DC | PRN
  Administered 2023-08-05: 4 mg via INTRAVENOUS

## 2023-08-05 MED ORDER — SUGAMMADEX SODIUM 500 MG/5ML IV SOLN
INTRAVENOUS | Status: DC | PRN
  Administered 2023-08-05: 400 mg via INTRAVENOUS

## 2023-08-05 MED ORDER — HALOPERIDOL LACTATE 5 MG/ML IJ SOLN: 1.0000 mg | Freq: Once | INTRAMUSCULAR | Status: DC

## 2023-08-05 MED ORDER — DEXTROSE 5 % IV SOLN
0.2500 ug/kg/h | INTRAVENOUS | Status: DC
  Administered 2023-08-05: 0.25 ug/kg/h via INTRAVENOUS
  Filled 2023-08-05: qty 5

## 2023-08-05 MED ORDER — DEXMEDETOMIDINE HCL IN NACL 400 MCG/100ML IV SOLN
0.0000 ug/kg/h | INTRAVENOUS | Status: DC
  Administered 2023-08-05: 0.4 ug/kg/h via INTRAVENOUS
  Filled 2023-08-05: qty 100

## 2023-08-05 MED ORDER — DIPHENHYDRAMINE HCL 50 MG/ML IJ SOLN
INTRAMUSCULAR | Status: AC
  Filled 2023-08-05: qty 1

## 2023-08-05 MED ORDER — SCOPOLAMINE 1 MG/3DAYS TD PT72
1.0000 | MEDICATED_PATCH | TRANSDERMAL | Status: DC
  Administered 2023-08-05: 1.5 mg via TRANSDERMAL

## 2023-08-05 MED ORDER — DEXAMETHASONE SODIUM PHOSPHATE 10 MG/ML IJ SOLN
INTRAMUSCULAR | Status: AC
  Filled 2023-08-05: qty 1

## 2023-08-05 MED ORDER — SODIUM CHLORIDE 0.9 % IV SOLN
2.0000 g | INTRAVENOUS | Status: AC
  Administered 2023-08-05: 2 g via INTRAVENOUS
  Filled 2023-08-05: qty 2

## 2023-08-05 MED ORDER — FENTANYL CITRATE (PF) 100 MCG/2ML IJ SOLN
INTRAMUSCULAR | Status: DC | PRN
  Administered 2023-08-05: 100 ug via INTRAVENOUS
  Administered 2023-08-05: 50 ug via INTRAVENOUS

## 2023-08-05 MED ORDER — STERILE WATER FOR IRRIGATION IR SOLN
Status: DC | PRN
  Administered 2023-08-05: 500 mL

## 2023-08-05 MED ORDER — ONDANSETRON HCL 4 MG/2ML IJ SOLN: 4.0000 mg | Freq: Once | INTRAMUSCULAR | Status: DC | PRN

## 2023-08-05 MED ORDER — MIDAZOLAM HCL 2 MG/2ML IJ SOLN
INTRAMUSCULAR | Status: AC
  Filled 2023-08-05: qty 2

## 2023-08-05 MED ORDER — ONDANSETRON HCL 4 MG/2ML IJ SOLN
INTRAMUSCULAR | Status: AC
  Filled 2023-08-05: qty 2

## 2023-08-05 MED ORDER — HYDROCORTISONE 1 % EX CREA
TOPICAL_CREAM | CUTANEOUS | Status: AC
  Filled 2023-08-05: qty 28

## 2023-08-05 MED ORDER — MIDAZOLAM HCL 5 MG/5ML IJ SOLN
INTRAMUSCULAR | Status: DC | PRN
  Administered 2023-08-05: 2 mg via INTRAVENOUS

## 2023-08-05 MED ORDER — FENTANYL CITRATE (PF) 250 MCG/5ML IJ SOLN
INTRAMUSCULAR | Status: AC
  Filled 2023-08-05: qty 5

## 2023-08-05 MED ORDER — ACETAMINOPHEN 500 MG PO TABS
1000.0000 mg | ORAL_TABLET | Freq: Once | ORAL | Status: AC
  Administered 2023-08-05: 1000 mg via ORAL
  Filled 2023-08-05: qty 2

## 2023-08-05 MED ORDER — DEXMEDETOMIDINE HCL IN NACL 80 MCG/20ML IV SOLN
INTRAVENOUS | Status: AC
  Filled 2023-08-05: qty 20

## 2023-08-05 MED ORDER — MIDAZOLAM HCL 2 MG/2ML IJ SOLN
INTRAMUSCULAR | Status: AC
Start: 2023-08-05 — End: ?
  Filled 2023-08-05: qty 2

## 2023-08-05 MED ORDER — DEXAMETHASONE SODIUM PHOSPHATE 10 MG/ML IJ SOLN
INTRAMUSCULAR | Status: DC | PRN
  Administered 2023-08-05: 10 mg via INTRAVENOUS

## 2023-08-05 MED ORDER — MIDAZOLAM HCL 2 MG/2ML IJ SOLN
1.0000 mg | INTRAMUSCULAR | Status: AC | PRN
  Administered 2023-08-05 (×4): 1 mg via INTRAVENOUS
  Filled 2023-08-05: qty 2

## 2023-08-05 MED ORDER — PROPOFOL 10 MG/ML IV BOLUS
INTRAVENOUS | Status: DC | PRN
  Administered 2023-08-05: 200 mg via INTRAVENOUS

## 2023-08-05 MED ORDER — LACTATED RINGERS IV SOLN: INTRAVENOUS | Status: DC

## 2023-08-05 MED ORDER — INDOCYANINE GREEN 25 MG IV SOLR: 2.5000 mg | Freq: Once | INTRAVENOUS | Status: AC

## 2023-08-05 MED ORDER — DEXMEDETOMIDINE HCL IN NACL 80 MCG/20ML IV SOLN
INTRAVENOUS | Status: DC | PRN
  Administered 2023-08-05: 20 ug via INTRAVENOUS
  Administered 2023-08-05: 8 ug via INTRAVENOUS
  Administered 2023-08-05: 20 ug via INTRAVENOUS
  Administered 2023-08-05: 12 ug via INTRAVENOUS

## 2023-08-05 MED ORDER — BUPIVACAINE HCL (PF) 0.5 % IJ SOLN
INTRAMUSCULAR | Status: AC
  Filled 2023-08-05: qty 30

## 2023-08-05 MED ORDER — SCOPOLAMINE 1 MG/3DAYS TD PT72
MEDICATED_PATCH | TRANSDERMAL | Status: AC
  Filled 2023-08-05: qty 1

## 2023-08-05 MED ORDER — CHLORHEXIDINE GLUCONATE 0.12 % MT SOLN
15.0000 mL | Freq: Once | OROMUCOSAL | Status: AC
  Administered 2023-08-05: 15 mL via OROMUCOSAL
  Filled 2023-08-05: qty 15

## 2023-08-05 MED ORDER — PROPOFOL 10 MG/ML IV BOLUS
INTRAVENOUS | Status: AC
  Filled 2023-08-05: qty 20

## 2023-08-05 MED ORDER — BUPIVACAINE HCL (PF) 0.5 % IJ SOLN
INTRAMUSCULAR | Status: DC | PRN
  Administered 2023-08-05: 30 mL

## 2023-08-05 MED ORDER — LACTATED RINGERS IV SOLN: INTRAVENOUS | Status: AC

## 2023-08-05 SURGICAL SUPPLY — 37 items
BLADE SURG 15 STRL LF DISP TIS (BLADE) ×1 IMPLANT
CAUTERY HOOK MNPLR 1.6 DVNC XI (INSTRUMENTS) ×1 IMPLANT
CHLORAPREP W/TINT 26 (MISCELLANEOUS) ×1 IMPLANT
CLIP LIGATING HEM O LOK PURPLE (MISCELLANEOUS) ×1 IMPLANT
COVER LIGHT HANDLE STERIS (MISCELLANEOUS) ×2 IMPLANT
DERMABOND ADVANCED .7 DNX12 (GAUZE/BANDAGES/DRESSINGS) ×1 IMPLANT
DRAPE ARM DVNC X/XI (DISPOSABLE) ×4 IMPLANT
DRAPE COLUMN DVNC XI (DISPOSABLE) ×1 IMPLANT
ELECT REM PT RETURN 9FT ADLT (ELECTROSURGICAL) ×1
ELECTRODE REM PT RTRN 9FT ADLT (ELECTROSURGICAL) ×1 IMPLANT
FORCEPS BPLR R/ABLATION 8 DVNC (INSTRUMENTS) ×1 IMPLANT
FORCEPS PROGRASP DVNC XI (FORCEP) ×1 IMPLANT
GLOVE BIO SURGEON STRL SZ 6.5 (GLOVE) ×2 IMPLANT
GLOVE BIOGEL PI IND STRL 6.5 (GLOVE) ×2 IMPLANT
GLOVE BIOGEL PI IND STRL 7.0 (GLOVE) ×2 IMPLANT
GOWN STRL REUS W/TWL LRG LVL3 (GOWN DISPOSABLE) ×4 IMPLANT
GRASPER SUT TROCAR 14GX15 (MISCELLANEOUS) IMPLANT
KIT TURNOVER KIT A (KITS) ×1 IMPLANT
NDL HYPO 21X1.5 SAFETY (NEEDLE) ×1 IMPLANT
NDL INSUFFLATION 14GA 120MM (NEEDLE) ×1 IMPLANT
NEEDLE HYPO 21X1.5 SAFETY (NEEDLE) ×1
NEEDLE INSUFFLATION 14GA 120MM (NEEDLE) ×1
OBTURATOR OPTICAL STND 8 DVNC (TROCAR) ×1
OBTURATOR OPTICALSTD 8 DVNC (TROCAR) ×1 IMPLANT
PACK LAP CHOLE LZT030E (CUSTOM PROCEDURE TRAY) ×1 IMPLANT
PAD ARMBOARD 7.5X6 YLW CONV (MISCELLANEOUS) ×1 IMPLANT
PENCIL HANDSWITCHING (ELECTRODE) ×1 IMPLANT
POSITIONER HEAD 8X9X4 ADT (SOFTGOODS) ×1 IMPLANT
SEAL UNIV 5-12 XI (MISCELLANEOUS) ×4 IMPLANT
SET BASIN LINEN APH (SET/KITS/TRAYS/PACK) ×1 IMPLANT
SET TUBE DA VINCI INSUFFLATOR (TUBING) IMPLANT
SUT MNCRL AB 4-0 PS2 18 (SUTURE) ×2 IMPLANT
SUT VICRYL 0 AB UR-6 (SUTURE) IMPLANT
SYR 30ML LL (SYRINGE) ×1 IMPLANT
SYS RETRIEVAL 5MM INZII UNIV (BASKET) ×1
SYSTEM RETRIEVL 5MM INZII UNIV (BASKET) ×1 IMPLANT
WATER STERILE IRR 500ML POUR (IV SOLUTION) ×1 IMPLANT

## 2023-08-05 NOTE — Progress Notes (Signed)
[facility]  Patient had difficulty waking up and required precedex. Doing better. Now. Repeat Tylenol now. Checked on her and discussed with her husband.  Algis Greenhouse, MD [facility] [address] [place], [place] [address] [phone] (office)

## 2023-08-05 NOTE — Discharge Instructions (Addendum)
Discharge Robotic Assisted Laparoscopic Surgery Instructions:  Common Complaints: Right shoulder pain is common after laparoscopic surgery.  This is secondary to the gas used in the surgery being trapped under the diaphragm.  Walk to help your body absorb the gas. This will improve in a few days. Pain at the port sites are common, especially the larger port sites. This will improve with time.  Some nausea is common and poor appetite. The main goal is to stay hydrated the first few days after surgery.   Diet/ Activity: Diet as tolerated. You may not have an appetite, but it is important to stay hydrated.  Drink 64 ounces of water a day. Your appetite will return with time.  Shower per your regular routine daily.  Do not take hot showers. Take warm showers that are less than 10 minutes. Rest and listen to your body, but do not remain in bed all day.  Walk everyday for at least 15-20 minutes. Deep cough and move around every 1-2 hours in the first few days after surgery.  Do not lift > 10 lbs, perform excessive bending, pushing, pulling, squatting for 1-2 weeks after surgery.  Do not pick at the dermabond glue on your incision sites.  This glue film will remain in place for 1-2 weeks and will start to peel off.  Do not place lotions or balms on your incision unless instructed to specifically by Dr. Henreitta Leber.   Pain Expectations and Narcotics: -After surgery you will have pain associated with your incisions and this is normal. The pain is muscular and nerve pain, and will get better with time. -You are encouraged and expected to take non narcotic medications like tylenol and ibuprofen (when able) to treat pain as multiple modalities can aid with pain treatment. -Narcotics are only used when pain is severe or there is breakthrough pain. -You are not expected to have a pain score of 0 after surgery, as we cannot prevent pain. A pain score of 3-4 that allows you to be functional, move, walk, and  tolerate some activity is the goal. The pain will continue to improve over the days after surgery and is dependent on your surgery. -Due to [place] law, we are only able to give a certain amount of pain medication to treat post operative pain, and we only give additional narcotics on a patient by patient basis.  -For most laparoscopic surgery, studies have shown that the majority of patients only need 10-15 narcotic pills, and for open surgeries most patients only need 15-20.   -Having appropriate expectations of pain and knowledge of pain management with non narcotics is important as we do not want anyone to become addicted to narcotic pain medication.  -Using ice packs in the first 48 hours and heating pads after 48 hours, wearing an abdominal binder (when recommended), and using over the counter medications are all ways to help with pain management.   -Simple acts like meditation and mindfulness practices after surgery can also help with pain control and research has proven the benefit of these practices.  Medication: Take tylenol and ibuprofen as needed for pain control, alternating every 4-6 hours.  Example:  Tylenol 1000mg  @ 6am, 12noon, 6pm, (Do not exceed 4000mg  of tylenol a day). Ibuprofen 800mg  @ 9am, 3pm, 9pm, 3am (Do not exceed 3600mg  of ibuprofen a day).  Take Tramadol for breakthrough pain every 4 hours.  Take Colace for constipation related to narcotic pain medication. If you do not have a bowel movement in  2 days, take Miralax over the counter.  Drink plenty of water to also prevent constipation.   Contact Information: If you have questions or concerns, please call our office, [phone], Monday- Thursday 8AM-5PM and Friday 8AM-12Noon.  If it is after hours or on the weekend, please call [facility] Main Number, [phone], [phone], and ask to speak to the surgeon on call for Dr. Henreitta Leber at [facility].

## 2023-08-05 NOTE — Progress Notes (Signed)
[facility]  Patient told RN she can tolerated roxycodone (allergy listed for hives) and would prefer me to send that in. Will send in that and cancel the tramadol. I have called the [facility] in [place] and canceled the Tramadol Rx.   Algis Greenhouse, MD [facility] [address] [place], [place] [address] [phone] (office)

## 2023-08-05 NOTE — Interval H&P Note (Signed)
History and Physical Interval Note:  08/05/2023 7:22 AM  Marcia Gonzalez  has presented today for surgery, with the diagnosis of CHOLELITHIASIS.  The various methods of treatment have been discussed with the patient and family. After consideration of risks, benefits and other options for treatment, the patient has consented to  Procedure(s): XI ROBOTIC ASSISTED LAPAROSCOPIC CHOLECYSTECTOMY (N/A) as a surgical intervention.  The patient's history has been reviewed, patient examined, no change in status, stable for surgery.  I have reviewed the patient's chart and labs.  Questions were answered to the patient's satisfaction.     Lucretia Roers

## 2023-08-05 NOTE — Anesthesia Procedure Notes (Signed)
Procedure Name: Intubation Date/Time: 08/05/2023 7:43 AM  Performed by: Shanon Payor, CRNAPre-anesthesia Checklist: Patient identified, Emergency Drugs available, Suction available, Patient being monitored and Timeout performed Patient Re-evaluated:Patient Re-evaluated prior to induction Oxygen Delivery Method: Circle system utilized Preoxygenation: Pre-oxygenation with 100% oxygen Induction Type: IV induction Ventilation: Mask ventilation without difficulty Laryngoscope Size: Mac and 3 Grade View: Grade I Tube type: Oral Tube size: 7.0 mm Number of attempts: 1 Airway Equipment and Method: Stylet Placement Confirmation: ETT inserted through vocal cords under direct vision, positive ETCO2, CO2 detector and breath sounds checked- equal and bilateral Secured at: 22 cm Tube secured with: Tape (paper tape) Dental Injury: Teeth and Oropharynx as per pre-operative assessment

## 2023-08-05 NOTE — Anesthesia Postprocedure Evaluation (Signed)
Anesthesia Post Note  Patient: Marcia Gonzalez  Procedure(s) Performed: XI ROBOTIC ASSISTED LAPAROSCOPIC CHOLECYSTECTOMY (Abdomen)  Patient location during evaluation: PACU Anesthesia Type: General Level of consciousness: awake and alert Pain management: pain level controlled Vital Signs Assessment: post-procedure vital signs reviewed and stable Respiratory status: spontaneous breathing, nonlabored ventilation, respiratory function stable and patient connected to nasal cannula oxygen Cardiovascular status: blood pressure returned to baseline and stable Postop Assessment: no apparent nausea or vomiting Anesthetic complications: no   There were no known notable events for this encounter.   Last Vitals:  Vitals:   08/05/23 1100 08/05/23 1105  BP: (!) 152/93   Pulse: (!) 104 (!) 103  Resp: 14 13  Temp:  36.6 C  SpO2: 99% 96%    Last Pain:  Vitals:   08/05/23 0855  PainSc: Asleep                 Rochella Benner L Jalon Blackwelder

## 2023-08-05 NOTE — Transfer of Care (Signed)
Immediate Anesthesia Transfer of Care Note  Patient: Marcia Gonzalez  Procedure(s) Performed: XI ROBOTIC ASSISTED LAPAROSCOPIC CHOLECYSTECTOMY (Abdomen)  Patient Location: PACU  Anesthesia Type:General  Level of Consciousness: awake, drowsy, and patient cooperative  Airway & Oxygen Therapy: Patient Spontanous Breathing and Patient connected to face mask oxygen  Post-op Assessment: Report given to RN, Post -op Vital signs reviewed and stable, and Patient moving all extremities X 4  Post vital signs: Reviewed and stable  Last Vitals:  Vitals Value Taken Time  BP 123/73 08/05/23 0855  Temp    Pulse 91 08/05/23 0855  Resp 16 08/05/23 0855  SpO2 99 % 08/05/23 0855  Vitals shown include unfiled device data.  Last Pain:  Vitals:   08/05/23 0651  PainSc: 0-No pain         Complications: No notable events documented.

## 2023-08-05 NOTE — Progress Notes (Signed)
[facility]  Updated family. Tramadol and zofran sent to [facility]. Will do phone call 1/30.  Algis Greenhouse, MD [facility] [address] [place], [place] [address] [phone] (office)

## 2023-08-05 NOTE — Op Note (Signed)
[facility] Operative Note  08/05/23  Preoperative Diagnosis: Symptomatic Cholelithiasis   Postoperative Diagnosis: Same   Procedure(s) Performed: Robotic Assisted Laparoscopic Cholecystectomy   Surgeon: Leatrice Jewels. Henreitta Leber, MD   Assistants: No qualified resident was available    Anesthesia: General endotracheal   Anesthesiologist: Dr. Leta Jungling    Specimens: Gallbladder   Estimated Blood Loss: Minimal   Blood Replacement: None    Complications: None    Wound Class: Clean contaminated   Operative Indications: The patient was found to have stones on imaging and was symptomatic.  We discussed the risk of the procedure including but not limited to bleeding, infection, injury to the common bile duct, bile leak, need for further procedures, chance of subtotal cholecystectomy.   Findings:  Normal appearing liver Critical view of safety noted All clips intact at the end of the case Adequate hemostasis   Procedure: Firefly was given in the preoperative area. The patient was taken to the operating room and placed supine. General endotracheal anesthesia was induced. Intravenous antibiotics were  administered per protocol.  An orogastric tube positioned to decompress the stomach. The abdomen was prepared and draped in the usual sterile fashion.   Veress needle was placed at the supraumbilical area and insufflation was started after confirming a positive saline drop test and no immediate increase in abdominal pressure.  After reaching 15 mm, the Veress needle was removed and a 8 mm port was placed via optiview technique supraumbilical, measuring 20 mm away from the suspected position of the gallbladder.  The abdomen was inspected and no abnormalities or injuries were found.  Under direct vision, ports were placed in the following locations in a semi curvilinear position around the target of the gallbladder: Two 8 mm ports on the patient's right each having 8cm clearance to  the adjacent ports and one 8 mm port placed on the patient's left 8 cm from the umbilical port. Once ports were placed, the table was placed in the reverse Trendelenburg position with the right side up. The Xi platform was brought into the operative field and docked to the ports successfully.  An endoscope was placed through the umbilical port, prograsper through the most lateral right port, forced bipolar to the port just right of the umbilicus, and then a hook cautery in the left port.   The dome of the gallbladder was grasped with prograsp and retracted over the dome of the liver. Adhesions between the gallbladder and omentum, duodenum and transverse colon were lysed via hook cautery. The infundibulum was grasped with the forced bipolar and retracted toward the right lower quadrant. This maneuver exposed Calot's triangle. Firefly was used throughout the dissection to ensure safe visualization of the cystic duct.The peritoneum overlying the gallbladder infundibulum was then dissected and the cystic duct and cystic artery identified.  Critical view of safety with the liver bed clearly visible behind the duct and artery with no additional structures noted.  The cystic duct and cystic artery were doubly clipped and divided close to the gallbladder.    The gallbladder was then dissected from its peritoneal and liver bed attachments by electrocautery. Hemostasis was checked prior to removing the hook cautery.  A 5mm Endo Catch bag was then placed through the left side port. The [employer] was undocked and moved out of the field,  and the gallbladder was removed in the bag.  The gallbladder was passed off the table as a specimen. There was no evidence of bleeding from the gallbladder fossa or cystic  artery or leakage of the bile from the cystic duct stump. The left port site closed with a 0 vicryl and PMI due to dilation from removing the gallbladder.The abdomen was desufflated and secondary trocars were removed under  direct vision.   No bleeding was noted. All skin incisions were closed with subcuticular sutures of 4-0 monocryl and dermabond.   Final inspection revealed acceptable hemostasis. All counts were correct at the end of the case. The patient was awakened from anesthesia and extubated without complication. The OG tube was removed.  The patient went to the PACU in stable condition.   Algis Greenhouse, MD [facility] [address], [place] [address] [phone] (office)

## 2023-08-06 ENCOUNTER — Telehealth: Payer: Self-pay

## 2023-08-06 LAB — SURGICAL PATHOLOGY

## 2023-08-06 NOTE — Telephone Encounter (Signed)
Copied from CRM [id number]. Topic: Clinical - Prescription Issue >> Aug 06, 2023  4:18 PM Elle L wrote: Reason for CRM: The patient needs a Prior Authorization for her SYNTHROID 32 MCG tablet. She can not use the generic due to complications. The Prior Authorization number is [id number].

## 2023-08-08 ENCOUNTER — Telehealth: Payer: Commercial Managed Care - PPO | Admitting: Physician Assistant

## 2023-08-08 DIAGNOSIS — B370 Candidal stomatitis: Secondary | ICD-10-CM

## 2023-08-08 DIAGNOSIS — K1379 Other lesions of oral mucosa: Secondary | ICD-10-CM

## 2023-08-08 MED ORDER — NYSTATIN 100000 UNIT/ML MT SUSP: 5.0000 mL | Freq: Four times a day (QID) | OROMUCOSAL | 0 refills | Status: AC

## 2023-08-08 NOTE — Progress Notes (Signed)
Thank you for the details you included in the comment boxes. Those details are very helpful in determining the best course of treatment for you and help [place] to provide the best care.Because we need to see you in "person" for this , we recommend that you schedule a Virtual Urgent Care video visit in order for the provider to better assess what is going on.  The provider will be able to give you a more accurate diagnosis and treatment plan if we can more freely discuss your symptoms and with the addition of a virtual examination.    If you change your visit to a video visit, we will bill your insurance (similar to an office visit) and you will not be charged for this e-Visit. You will be able to stay at home and speak with the first available [facility] advanced practice provider. The link to do a video visit is in the drop down Menu tab of your Welcome screen in MyChart.

## 2023-08-08 NOTE — Progress Notes (Signed)
Virtual Visit Consent   Marcia Gonzalez, you are scheduled for a virtual visit with a [place] provider today. Just as with appointments in the office, your consent must be obtained to participate. Your consent will be active for this visit and any virtual visit you may have with one of our providers in the next 365 days. If you have a MyChart account, a copy of this consent can be sent to you electronically.  As this is a virtual visit, video technology does not allow for your provider to perform a traditional examination. This may limit your provider's ability to fully assess your condition. If your provider identifies any concerns that need to be evaluated in person or the need to arrange testing (such as labs, EKG, etc.), we will make arrangements to do so. Although advances in technology are sophisticated, we cannot ensure that it will always work on either your end or our end. If the connection with a video visit is poor, the visit may have to be switched to a telephone visit. With either a video or telephone visit, we are not always able to ensure that we have a secure connection.  By engaging in this virtual visit, you consent to the provision of healthcare and authorize for your insurance to be billed (if applicable) for the services provided during this visit. Depending on your insurance coverage, you may receive a charge related to this service.  I need to obtain your verbal consent now. Are you willing to proceed with your visit today? Marcia Gonzalez has provided verbal consent on 08/08/2023 for a virtual visit (video or telephone). Marcia Jaffe, PA-C  Date: 08/08/2023 11:02 AM  Virtual Visit via Video Note   I, Marcia Gonzalez, connected with  Marcia Gonzalez  ([id number], May 24, 1996) on 08/08/23 at 11:15 AM EST by a video-enabled telemedicine application and verified that I am speaking with the correct person using two identifiers.  Location: Patient: Virtual Visit Location Patient:  Home Provider: Virtual Visit Location Provider: Home Office   I discussed the limitations of evaluation and management by telemedicine and the availability of in person appointments. The patient expressed understanding and agreed to proceed.    History of Present Illness: Marcia Gonzalez is a 28 y.o. who identifies as a female who was assigned female at birth, underwent gallbladder surgery on the 16th of the current month. A day or two post-surgery, they began experiencing a sore throat, initially attributed to intubation discomfort. Despite attempts to manage the discomfort with Tylenol, the sore throat progressively worsened.  Additionally, they describe their tongue as being very sore and covered with white patches. Attempts to alleviate symptoms with warm water gargling and salt water gargling have been unsuccessful.  The patient has a past medical history of a similar episode following the birth of their son. At that time, the diagnosis of oral thrush was made.    Problems:  Patient Active Problem List   Diagnosis Date Noted   Calculus of gallbladder without cholecystitis without obstruction 07/29/2023   History of ADHD 06/10/2023   Mixed hyperlipidemia 12/16/2022   Mixed stress and urge urinary incontinence 09/22/2022   Vasovagal syncope 09/21/2022   History of suicide attempt: overdose 08/07/2022   Borderline personality disorder (HCC) 08/07/2022   PTSD (post-traumatic stress disorder) 08/07/2022   Claustrophobia 08/07/2022   Binge-eating disorder, in partial remission, moderate 08/07/2022   IIH (idiopathic intracranial hypertension) 10/21/2021   Recurrent major depressive disorder, in full remission (HCC) 10/21/2021   Anemia 06/30/2021  Generalized anxiety disorder with panic attacks 06/30/2021   Class 2 severe obesity with serious comorbidity and body mass index (BMI) of 39.0 to 39.9 in adult [facility]) 06/30/2021   Hashimoto's thyroiditis 10/16/2011    Allergies:  Allergies   Allergen Reactions   Codeine Nausea Only    Codeine based cough syrups    Percocet [Oxycodone-Acetaminophen] Hives and Itching    Pt tolerates oxycodone and tylenol separately    Amoxicillin-Pot Clavulanate Other (See Comments)    Hallucinations   Latex Other (See Comments)    Gets Blisters   Misoprostol     Left blisters in mouth   Pollen Extract Other (See Comments)   Tape Rash    Tolerates paper tape   Medications:  Current Outpatient Medications:    nystatin (MYCOSTATIN) 100000 UNIT/ML suspension, Take 5 mLs (500,000 Units total) by mouth 4 (four) times daily for 7 days., Disp: 140 mL, Rfl: 0   acetaminophen (TYLENOL) 500 MG tablet, Take 1,000 mg by mouth every 6 (six) hours as needed for moderate pain (pain score 4-6)., Disp: , Rfl:    aluminum hydroxide-magnesium carbonate (GAVISCON) 95-358 MG/15ML SUSP, Take 15 mLs by mouth daily before supper., Disp: , Rfl:    atomoxetine (STRATTERA) 40 MG capsule, Take 1 tablet by mouth for one week. Then increase to two tablets daily thereafter., Disp: 60 capsule, Rfl: 2   calcium carbonate (TUMS - DOSED IN MG ELEMENTAL CALCIUM) 500 MG chewable tablet, Chew 2-3 tablets by mouth daily as needed for heartburn., Disp: , Rfl:    citalopram (CELEXA) 40 MG tablet, TAKE 1 TABLET BY MOUTH EVERY DAY, Disp: 90 tablet, Rfl: 0   estradiol (ESTRACE) 1 MG tablet, Take 1 mg by mouth daily., Disp: , Rfl:    famotidine (PEPCID) 20 MG tablet, Take 20 mg by mouth daily., Disp: , Rfl:    Ferrous Sulfate (IRON PO), Take 1 tablet by mouth daily., Disp: , Rfl:    lamoTRIgine (LAMICTAL) 25 MG tablet, Take 2 tablets (50 mg total) by mouth at bedtime., Disp: 60 tablet, Rfl: 2   levonorgestrel (MIRENA, 52 MG,) 20 MCG/DAY IUD, 1 each by Intrauterine route once., Disp: , Rfl:    Multiple Vitamin (MULTIVITAMIN WITH MINERALS) TABS tablet, Take 1 tablet by mouth daily., Disp: , Rfl:    naproxen sodium (ALEVE) 220 MG tablet, Take 220 mg by mouth 2 (two) times daily as needed  (pain)., Disp: , Rfl:    ondansetron (ZOFRAN) 4 MG tablet, Take 1 tablet (4 mg total) by mouth every 8 (eight) hours as needed., Disp: 30 tablet, Rfl: 1   oxyCODONE (ROXICODONE) 5 MG immediate release tablet, Take 1 tablet (5 mg total) by mouth every 4 (four) hours as needed for breakthrough pain or severe pain (pain score 7-10). Please call if prescription not available. Dr. Henreitta Leber- [phone]. Cancel the Tramadol., Disp: 10 tablet, Rfl: 0   pantoprazole (PROTONIX) 40 MG tablet, Take 1 tablet (40 mg total) by mouth daily as needed. (Patient taking differently: Take 40 mg by mouth 2 (two) times daily.), Disp: 90 tablet, Rfl: 3   promethazine (PHENERGAN) 25 MG suppository, Place 12.5 mg rectally every 6 (six) hours as needed for vomiting or nausea., Disp: , Rfl:    Semaglutide-Weight Management (WEGOVY) 1 MG/0.5ML SOAJ, Inject 1 mg into the skin every 7 (seven) days. Dose reduction, Disp: 6 mL, Rfl: 1   SYNTHROID 88 MCG tablet, Take 1 tablet (88 mcg total) by mouth daily before breakfast., Disp: 90 tablet, Rfl: 3  Vitamin D, Ergocalciferol, (DRISDOL) 1.25 MG (50000 UNIT) CAPS capsule, Take 1 capsule (50,000 Units total) by mouth every 7 (seven) days., Disp: 12 capsule, Rfl: 3  Observations/Objective: Patient is well-developed, well-nourished in no acute distress.  Resting comfortably  at home.  Head is normocephalic, atraumatic.  No labored breathing.  Speech is clear and coherent with logical content.  Patient is alert and oriented at baseline.    Assessment and Plan: 1. Oral thrush (Primary) - nystatin (MYCOSTATIN) 100000 UNIT/ML suspension; Take 5 mLs (500,000 Units total) by mouth 4 (four) times daily for 7 days.  Dispense: 140 mL; Refill: 0   Trial nystatin.  Patient education given on supportive care. Follow Up Instructions: I discussed the assessment and treatment plan with the patient. The patient was provided an opportunity to ask questions and all were answered. The patient  agreed with the plan and demonstrated an understanding of the instructions.  A copy of instructions were sent to the patient via MyChart unless otherwise noted below.    The patient was advised to call back or seek an in-person evaluation if the symptoms worsen or if the condition fails to improve as anticipated.    Kasandra Knudsen Mayers, PA-C

## 2023-08-08 NOTE — Patient Instructions (Signed)
[name], thank you for joining [name], PA-C for today's virtual visit.  While this provider is not your primary care provider (PCP), if your PCP is located in our provider database this encounter information will be shared with them immediately following your visit.   A [facility] MyChart account gives you access to today's visit and all your visits, tests, and labs performed at [facility] " click here if you don't have a [facility] MyChart account or go to [email or web address]  Consent: (Patient) Deyona Weins provided verbal consent for this virtual visit at the beginning of the encounter.  Current Medications:  Current Outpatient Medications:    nystatin (MYCOSTATIN) 100000 UNIT/ML suspension, Take 5 mLs (500,000 Units total) by mouth 4 (four) times daily for 7 days., Disp: 140 mL, Rfl: 0   acetaminophen (TYLENOL) 500 MG tablet, Take 1,000 mg by mouth every 6 (six) hours as needed for moderate pain (pain score 4-6)., Disp: , Rfl:    aluminum hydroxide-magnesium carbonate (GAVISCON) 95-358 MG/15ML SUSP, Take 15 mLs by mouth daily before supper., Disp: , Rfl:    atomoxetine (STRATTERA) 40 MG capsule, Take 1 tablet by mouth for one week. Then increase to two tablets daily thereafter., Disp: 60 capsule, Rfl: 2   calcium carbonate (TUMS - DOSED IN MG ELEMENTAL CALCIUM) 500 MG chewable tablet, Chew 2-3 tablets by mouth daily as needed for heartburn., Disp: , Rfl:    citalopram (CELEXA) 40 MG tablet, TAKE 1 TABLET BY MOUTH EVERY DAY, Disp: 90 tablet, Rfl: 0   estradiol (ESTRACE) 1 MG tablet, Take 1 mg by mouth daily., Disp: , Rfl:    famotidine (PEPCID) 20 MG tablet, Take 20 mg by mouth daily., Disp: , Rfl:    Ferrous Sulfate (IRON PO), Take 1 tablet by mouth daily., Disp: , Rfl:    lamoTRIgine (LAMICTAL) 25 MG tablet, Take 2 tablets (50 mg total) by mouth at bedtime., Disp: 60 tablet, Rfl: 2   levonorgestrel (MIRENA, 52 MG,) 20 MCG/DAY IUD, 1 each by Intrauterine  route once., Disp: , Rfl:    Multiple Vitamin (MULTIVITAMIN WITH MINERALS) TABS tablet, Take 1 tablet by mouth daily., Disp: , Rfl:    naproxen sodium (ALEVE) 220 MG tablet, Take 220 mg by mouth 2 (two) times daily as needed (pain)., Disp: , Rfl:    ondansetron (ZOFRAN) 4 MG tablet, Take 1 tablet (4 mg total) by mouth every 8 (eight) hours as needed., Disp: 30 tablet, Rfl: 1   oxyCODONE (ROXICODONE) 5 MG immediate release tablet, Take 1 tablet (5 mg total) by mouth every 4 (four) hours as needed for breakthrough pain or severe pain (pain score 7-10). Please call if prescription not available. Dr. Henreitta Leber- [phone]. Cancel the Tramadol., Disp: 10 tablet, Rfl: 0   pantoprazole (PROTONIX) 40 MG tablet, Take 1 tablet (40 mg total) by mouth daily as needed. (Patient taking differently: Take 40 mg by mouth 2 (two) times daily.), Disp: 90 tablet, Rfl: 3   promethazine (PHENERGAN) 25 MG suppository, Place 12.5 mg rectally every 6 (six) hours as needed for vomiting or nausea., Disp: , Rfl:    Semaglutide-Weight Management (WEGOVY) 1 MG/0.5ML SOAJ, Inject 1 mg into the skin every 7 (seven) days. Dose reduction, Disp: 6 mL, Rfl: 1   SYNTHROID 88 MCG tablet, Take 1 tablet (88 mcg total) by mouth daily before breakfast., Disp: 90 tablet, Rfl: 3   Vitamin D, Ergocalciferol, (DRISDOL) 1.25 MG (50000 UNIT) CAPS capsule, Take 1 capsule (50,000 Units total) by mouth  every 7 (seven) days., Disp: 12 capsule, Rfl: 3   Medications ordered in this encounter:  Meds ordered this encounter  Medications   nystatin (MYCOSTATIN) 100000 UNIT/ML suspension    Sig: Take 5 mLs (500,000 Units total) by mouth 4 (four) times daily for 7 days.    Dispense:  140 mL    Refill:  0    Supervising Provider:   Merrilee Jansky [[id number]]     *If you need refills on other medications prior to your next appointment, please contact your pharmacy*  Follow-Up: Call back or seek an in-person evaluation if the symptoms worsen or if the  condition fails to improve as anticipated.   Virtual Care [phone]  Other Instructions Oral Thrush, Adult Oral thrush, also called oral candidiasis, is a fungal infection that develops in the mouth and throat and on the tongue. It causes white patches to form in the mouth and on the tongue. Many cases of thrush are mild, but this infection can also be serious. Ginette Pitman can be a repeated (recurrent) problem for certain people who have a weak body defense system (immune system). The weakness can be caused by chronic illnesses, or by taking medicines that limit the body's ability to fight infection. If a person has difficulty fighting infection, the fungus that causes thrush can spread through the body. This can cause life-threatening blood or organ infections. What are the causes? This condition is caused by a fungus (yeast) called Candida albicans. This fungus is normally present in small amounts in the mouth and on other mucous membranes. It usually causes no harm. If conditions are present that allow the fungus to grow without control, it invades surrounding tissues and becomes an infection. Other Candida species can also lead to thrush, though this is rare. What increases the risk? The following factors may make you more likely to develop this condition: Having a weakened immune system. Being an older adult. Having diabetes, cancer, or HIV (human immunodeficiency virus). Having dry mouth (xerostomia). Being pregnant or breastfeeding. Having poor dental care, especially in those who have dentures. Using antibiotic or steroid medicines. What are the signs or symptoms? Symptoms of this condition can vary from mild and moderate to severe and persistent. Symptoms may include: A burning feeling in the mouth and throat. This can occur at the start of a thrush infection. White patches that stick to the mouth and tongue. The tissue around the patches may be red, raw, and painful. If  rubbed (during tooth brushing, for example), the patches and the tissue of the mouth may bleed easily. A bad taste in the mouth or difficulty tasting foods. A cottony feeling in the mouth. Pain during eating and swallowing. Poor appetite. Cracking at the corners of the mouth. How is this diagnosed? This condition is diagnosed based on: A physical exam. Your medical history. How is this treated? This condition is treated with medicines called antifungals, which prevent the growth of fungi. These medicines are either applied directly to the affected area (topical) or swallowed (oral). The treatment will depend on the severity of the condition. Mild cases of thrush may be treated with an antifungal mouth rinse or lozenges. Treatment usually lasts about 14 days. Moderate to severe cases of thrush can be treated with oral antifungal medicine, if they have spread to the esophagus. A topical antifungal medicine may also be used. For some severe infections, treatment may need to continue for more than 14 days. Oral antifungal medicines are rarely used  during pregnancy because they may be harmful to the unborn child. If you are pregnant, talk with your health care provider about options for treatment. Persistent or recurrent thrush. For cases of thrush that do not go away or keep coming back: Treatment may be needed twice as long as the symptoms last. Treatment will include both oral and topical antifungal medicines. People with a weakened immune system can take an antifungal medicine on a continuous basis to prevent thrush infections. It is important to treat conditions that make a person more likely to get thrush, such as diabetes or HIV. Follow these instructions at home: Relieving soreness and discomfort To help reduce the discomfort of thrush: Drink cold liquids such as water or iced tea. Try flavored ice treats or frozen juices. Eat foods that are easy to swallow, such as gelatin, ice cream, or  custard. Try drinking from a straw if the patches in your mouth are painful.  General instructions Take or use over-the-counter and prescription medicines only as told by your health care provider. Eat plain, unflavored yogurt as directed by your health care provider. Check the label to make sure the yogurt contains live cultures. This yogurt can help healthy bacteria grow in the mouth and can stop the growth of the fungus that causes thrush. If you wear dentures, remove the dentures before going to bed, brush them vigorously, and soak them in a cleaning solution as directed by your health care provider. Rinse your mouth with a warm salt-water mixture several times a day. To make a salt-water mixture, dissolve -1 tsp (3-6 g) of salt in 1 cup (237 mL) of warm water. Contact a health care provider if: Your symptoms are getting worse or are not improving within 7 days of starting treatment. You have symptoms of a spreading infection, such as white patches on the skin outside of the mouth. You are breastfeeding your baby and you have redness and pain in the nipples. Summary Oral thrush, also called oral candidiasis, is a fungal infection that develops in the mouth and throat and on the tongue. It causes white patches to form in the mouth and on the tongue. You are more likely to get this condition if you have a weakened immune system or an underlying condition, such as HIV, cancer, or diabetes. This condition is treated with medicines called antifungals, which prevent the growth of fungi. Contact a health care provider if your symptoms do not improve, or get worse, within 7 days of starting treatment. This information is not intended to replace advice given to you by your health care provider. Make sure you discuss any questions you have with your health care provider. Document Revised: 06/22/2022 Document Reviewed: 06/22/2022 Elsevier Patient Education  2024 [employer]   If you have been  instructed to have an in-person evaluation today at a local Urgent Care facility, please use the link below. It will take you to a list of all of our available [facility], including address, phone number and hours of operation. Please do not delay care.  [facility]  If you or a family member do not have a primary care provider, use the link below to schedule a visit and establish care. When you choose a [facility] primary care physician or advanced practice provider, you gain a long-term partner in health. Find a Primary Care Provider  Learn more about [facility]'s in-office and virtual care options: [facility] - Get Care Now

## 2023-08-09 ENCOUNTER — Other Ambulatory Visit (HOSPITAL_COMMUNITY): Payer: Self-pay

## 2023-08-09 ENCOUNTER — Telehealth: Payer: Self-pay | Admitting: Family Medicine

## 2023-08-09 NOTE — Telephone Encounter (Unsigned)
Copied from CRM [id number]. Topic: Clinical - Prescription Issue >> Aug 09, 2023  4:17 PM Monisha R wrote: Reason for CRM: SYNTHROID 88 MCG tablet Patient can not have nothing but the actual brand. The generic brand isnt strong enough an the pharmacy needs prior autho to sign off on her mediciation. Patient is down to her last dosage for 08/10/23

## 2023-08-10 ENCOUNTER — Encounter (HOSPITAL_COMMUNITY): Payer: Self-pay | Admitting: Psychiatry

## 2023-08-10 ENCOUNTER — Other Ambulatory Visit (HOSPITAL_COMMUNITY): Payer: Self-pay

## 2023-08-10 ENCOUNTER — Telehealth: Payer: Self-pay

## 2023-08-10 ENCOUNTER — Telehealth (HOSPITAL_COMMUNITY): Payer: MEDICAID | Admitting: Psychiatry

## 2023-08-10 DIAGNOSIS — Z8659 Personal history of other mental and behavioral disorders: Secondary | ICD-10-CM

## 2023-08-10 DIAGNOSIS — F4310 Post-traumatic stress disorder, unspecified: Secondary | ICD-10-CM

## 2023-08-10 DIAGNOSIS — F603 Borderline personality disorder: Secondary | ICD-10-CM | POA: Diagnosis not present

## 2023-08-10 DIAGNOSIS — F411 Generalized anxiety disorder: Secondary | ICD-10-CM

## 2023-08-10 DIAGNOSIS — E559 Vitamin D deficiency, unspecified: Secondary | ICD-10-CM

## 2023-08-10 DIAGNOSIS — F330 Major depressive disorder, recurrent, mild: Secondary | ICD-10-CM

## 2023-08-10 DIAGNOSIS — F410 Panic disorder [episodic paroxysmal anxiety] without agoraphobia: Secondary | ICD-10-CM

## 2023-08-10 DIAGNOSIS — Z8639 Personal history of other endocrine, nutritional and metabolic disease: Secondary | ICD-10-CM
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
## 2023-08-10 NOTE — Telephone Encounter (Signed)
Pharmacy Patient Advocate Encounter   Received notification from Pt Calls Messages that prior authorization for SYNTHROID is required/requested.   Insurance verification completed.   The patient is insured through [employer] .   Per test claim: PA required; PA submitted to above mentioned insurance via CoverMyMeds Key/confirmation #/EOC [id number] Status is pending

## 2023-08-10 NOTE — Telephone Encounter (Signed)
PA request has been Submitted to [employer]. New Encounter created for follow up. For additional info see Pharmacy Prior Auth telephone encounter from 08/10/23.  Ran a test claim through Medicaid, received a paid claim for $4

## 2023-08-10 NOTE — Patient Instructions (Addendum)
We decreased the Strattera (atomoxetine) to 40 mg once daily for 1 week and then will discontinue since it was not working for your attention.  We didn't make any other changes today to give more time to restore vitamin d and continue to use the SAD lamp through the winter months. Check in with Dr. Reece Agar about the tremor but it is likely a benign one, an intention termor.

## 2023-08-10 NOTE — Telephone Encounter (Signed)
[facility], she has refills, please send to PA team ASAP to determine where they are at in the PA status.

## 2023-08-10 NOTE — Telephone Encounter (Unsigned)
Copied from CRM [id number]. Topic: Clinical - Prescription Issue >> Aug 10, 2023  2:32 PM Carlatta H wrote: Reason for CRM: Please call patients//She would like to know if she could get a temporary supply of medication until pre authorization go through//Patient is completely out of medication//

## 2023-08-11 NOTE — Telephone Encounter (Signed)
The PA was already started. Can you please check the status before I make a call?

## 2023-08-11 NOTE — Telephone Encounter (Signed)
PA request is pending. Per CMM, [facility] should respond within 72 hours

## 2023-08-11 NOTE — Telephone Encounter (Signed)
Patient states she has received a temporary supply.   Patient states that they said that in order for the PA to get approved that Dr. Reece Agar would have to call the number below and speak with them.    [phone]

## 2023-08-11 NOTE — Telephone Encounter (Signed)
The problem is NOT with the prescription but with the pharmacy. She can absolutely fill it off insurance or ask the pharmacy to give her a temporary supply if the pharmacist is willing to do this.

## 2023-08-16 ENCOUNTER — Encounter (HOSPITAL_COMMUNITY): Payer: Self-pay

## 2023-08-16 NOTE — Telephone Encounter (Signed)
Pt aware to come to office to sign release for records

## 2023-08-16 NOTE — Telephone Encounter (Unsigned)
Copied from CRM [id number]. Topic: Medical Record Request - Records Request >> Aug 16, 2023 10:31 AM Gildardo Pounds wrote: Reason for CRM: Patient wants medical records from [facility] and [facility] in [place] sent to Dr Nadine Counts. Patient's callback number is [phone]

## 2023-08-17 ENCOUNTER — Ambulatory Visit: Payer: Commercial Managed Care - PPO | Admitting: General Surgery

## 2023-08-17 ENCOUNTER — Encounter: Payer: Self-pay | Admitting: General Surgery

## 2023-08-17 ENCOUNTER — Telehealth: Payer: Self-pay | Admitting: Family Medicine

## 2023-08-17 VITALS — BP 125/87 | HR 87 | Temp 97.7°F | Resp 12 | Ht 65.0 in | Wt 214.0 lb

## 2023-08-17 DIAGNOSIS — K8020 Calculus of gallbladder without cholecystitis without obstruction: Secondary | ICD-10-CM

## 2023-08-17 MED ORDER — OXYCODONE HCL 5 MG PO TABS: 5.0000 mg | ORAL_TABLET | ORAL | 0 refills | Status: DC | PRN

## 2023-08-17 NOTE — Patient Instructions (Addendum)
Diet and activity as tolerated. Take ibuprofen and tylenol for pain. Take narcotic for breakthrough pain.  Use over the counter lidocaine patches/  lidocaine gel around the area.  Will talk on the phone in a few weeks.

## 2023-08-17 NOTE — Progress Notes (Unsigned)
[facility]  Having pain in the upper left side where the port site is located. This is where the gallbladder was removed and PMI closure was performed.   She is eating and having no fevers.  BP 125/87   Pulse 87   Temp 97.7 F (36.5 C) (Oral)   Resp 12   Ht 5\' 5"  (1.651 m)   Wt 214 lb (97.1 kg)   LMP 05/04/2023 (Approximate) Comment: pt states she has "been on my period since October and they are trying to figure out why".  SpO2 97%   BMI 35.61 kg/m  Port sites healing, no sign of hernia at left side site, no obvious bruise or swelling   A. GALLBLADDER, CHOLECYSTECTOMY:  - Chronic cholecystitis, cholesterolosis and cholelithiasis   Patient s/p robotic assisted cholecystectomy. With pain in the left port site. She likely had a hematoma in the area or bleeding from the stretching and PMI and this is causing more pain.   Diet and activity as tolerated. Take ibuprofen and tylenol for pain. Take narcotic for breakthrough pain.  Use over the counter lidocaine patches/  lidocaine gel around the area.  Will talk on the phone in a few weeks.   Future Appointments  Date Time Provider Department Center  08/31/2023 12:00 PM Lucretia Roers, MD [facility] None  09/27/2023  4:30 PM Elsie Lincoln, MD [facility] None  02/02/2024  2:15 PM Raliegh Ip, DO [facility] None   Algis Greenhouse, MD [facility] [address] [place], [place] [address] [phone] (office)

## 2023-08-19 ENCOUNTER — Encounter: Payer: Commercial Managed Care - PPO | Admitting: General Surgery

## 2023-08-19 NOTE — Telephone Encounter (Signed)
PA request has been Denied. New Encounter created for follow up. For additional info see Pharmacy Prior Auth telephone encounter from 08/10/23.

## 2023-08-19 NOTE — Telephone Encounter (Unsigned)
Copied from CRM [id number]. Topic: Clinical - Prescription Issue >> Aug 19, 2023  2:05 PM Gildardo Pounds wrote: Reason for CRM: Patient's husband states that a prior authorization needs to be sent in order for patient to receive SYNTHROID 88 MCG tablet for thyroid issue. Patient needs a statement showing the generic will not work, it has to be the name brand. Callback [phone].

## 2023-08-19 NOTE — Telephone Encounter (Signed)
Pharmacy Patient Advocate Encounter  Received notification from [facility] that Prior Authorization for Synthroid tablets has been DENIED.  See denial reason below. No denial letter attached in CMM. Will attach denial letter to Media tab once received.   PA #/Case ID/Reference #: [id number]   DENIAL REASON: Documentation required stating why patient cannot take the generic.

## 2023-08-20 ENCOUNTER — Telehealth: Payer: Self-pay | Admitting: Family Medicine

## 2023-08-20 NOTE — Telephone Encounter (Signed)
Copied from CRM [id number]. Topic: Medical Record Request - Records Request >> Aug 20, 2023  1:47 PM Prudencio Pair wrote: Reason for CRM: Patient calling to get an update on whether or not her medical records has been released to another provider. She stated she came to the office to sign a medical release form on yesterday. Please give her a call back. CB #: [phone].

## 2023-08-20 NOTE — Telephone Encounter (Signed)
 Can you please help with this ?

## 2023-08-20 NOTE — Telephone Encounter (Signed)
 Information has been sent to clinical pharmacist for appeals review. It may take 5-7 days to prepare the necessary documentation to request the appeal from the insurance.

## 2023-08-20 NOTE — Telephone Encounter (Signed)
Called patient and she states request was signed in our office on 08/16/23 to have records sent to Dr. Nadine Counts from: [facility]  I explained they were not received as of yet, but we should give a little more time.  We will watch for these to come in.

## 2023-08-20 NOTE — Telephone Encounter (Signed)
Please appeal:  Notes from 04/22/2022 per below:  1.  Hashimoto's thyroiditis This was diagnosed in her teens.  She still has her thyroid.  She is treated with brand-name Synthroid 88 mcg daily as she did not absorb generic well.  Her last TSH was obtained by her OB/GYN and this was normal range so Synthroid has been continued.  She notes it is about 60 box/month and was not aware that there was a mail order pharmacy for Synthroid.  She would certainly like to pursue medication from that pharmacy.     Raliegh Ip, DO  Physician Family Medicine   Progress Notes    Signed   Encounter Date: 04/22/2022   Signed     Expand All Collapse All     Subjective: CC: Establish care with new provider PCP: Raliegh Ip, DO [id number] Marcia Gonzalez is a 28 y.o. female presenting to clinic today for:   1.  Hashimoto's thyroiditis This was diagnosed in her teens.  She still has her thyroid.  She is treated with brand-name Synthroid 88 mcg daily as she did not absorb generic well.  Her last TSH was obtained by her OB/GYN and this was normal range so Synthroid has been continued.  She notes it is about 60 box/month and was not aware that there was a mail order pharmacy for Synthroid.  She would certainly like to pursue medication from that pharmacy.

## 2023-08-23 IMAGING — US US OB < 14 WEEKS - US OB TV
1 series · 15 of 28 positions shown · non-contrast
Comparison: None.

CLINICAL DATA: Heavy vaginal bleeding

EXAM:
OBSTETRIC <14 WK US AND TRANSVAGINAL OB US
TECHNIQUE: Both transabdominal and transvaginal ultrasound examinations were
performed for complete evaluation of the gestation as well as the
maternal uterus, adnexal regions, and pelvic cul-de-sac.
Transvaginal technique was performed to assess early pregnancy.

[Series 1: us ob < 14 weeks - us ob tv · 15 of 47 slices shown]
[im 1/47]
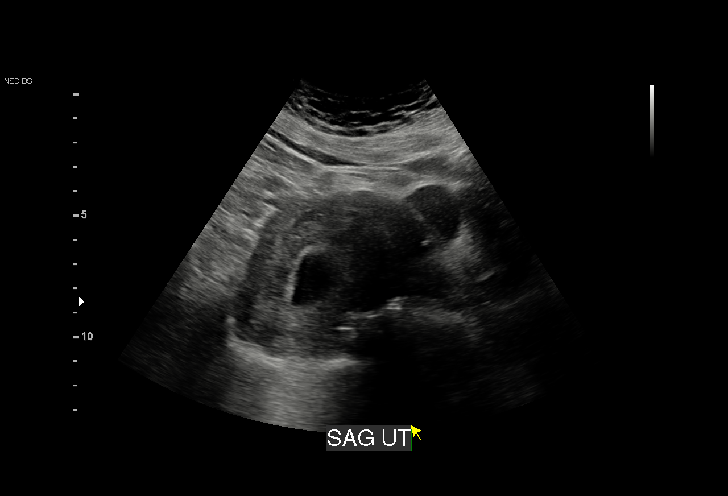
[im 4/47]
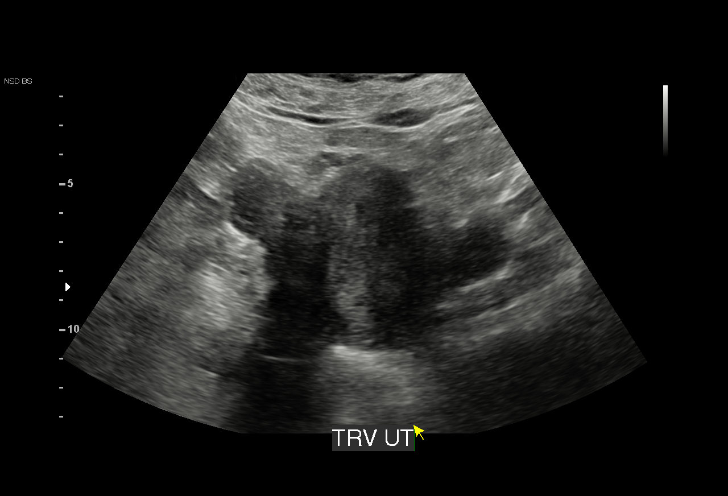
[im 7/47]
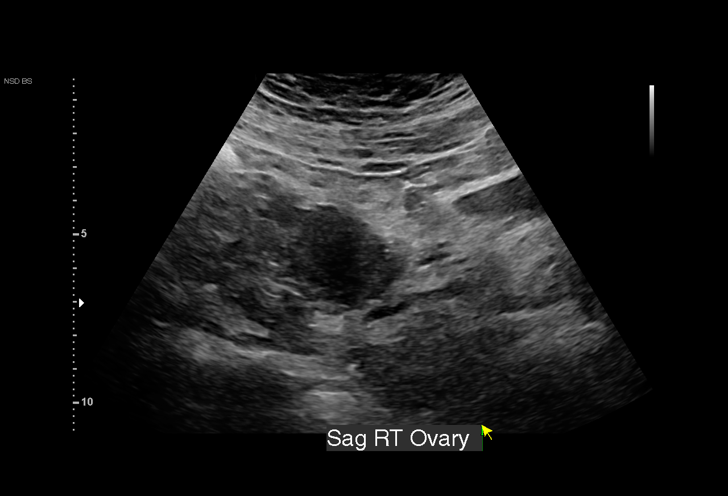
[im 11/47]
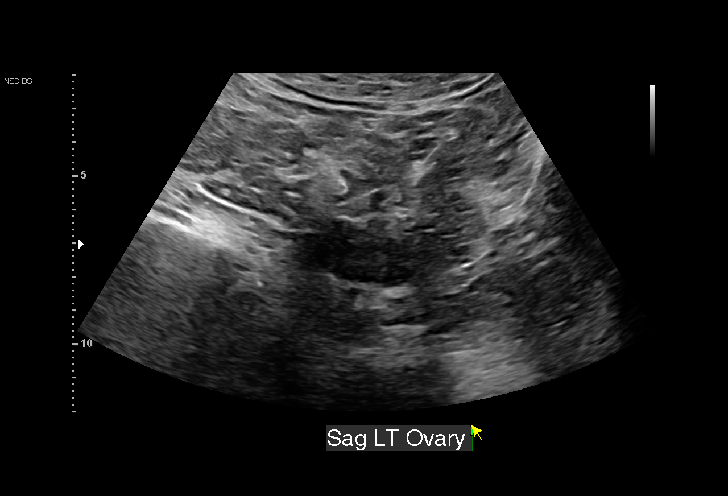
[im 14/47]
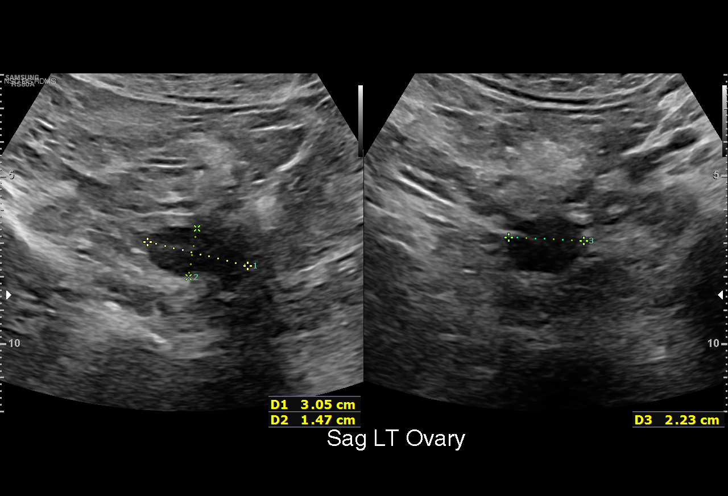
[im 18/47]
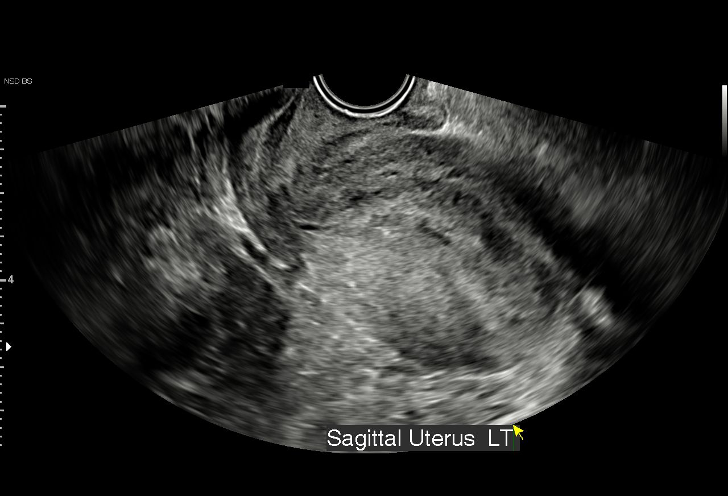
[im 21/47]
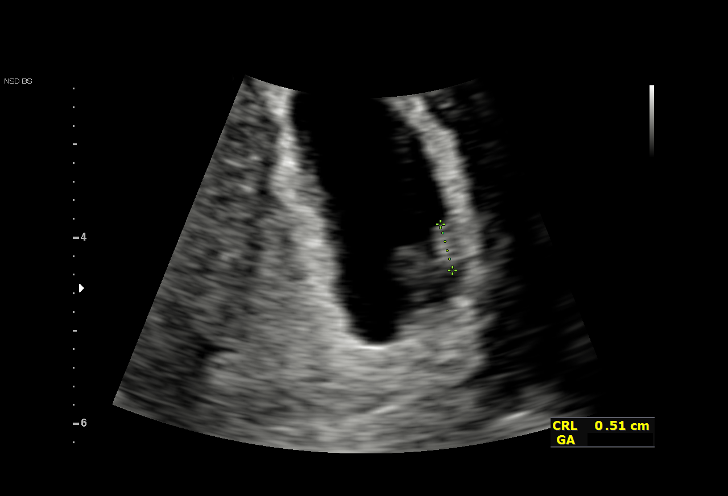
[im 24/47]
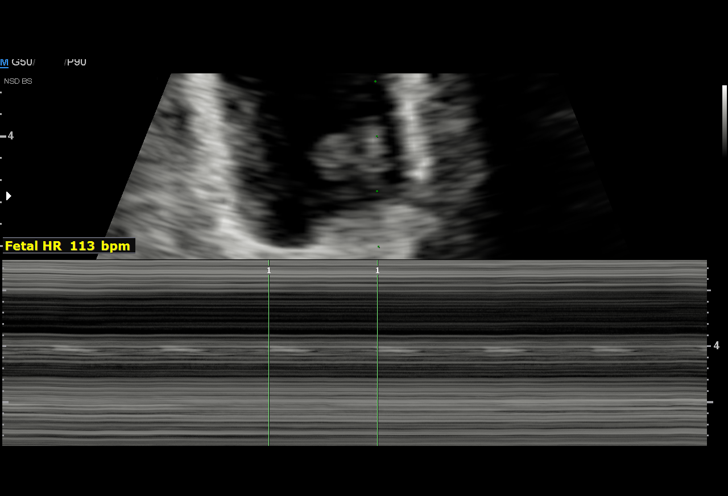
[im 26/47]
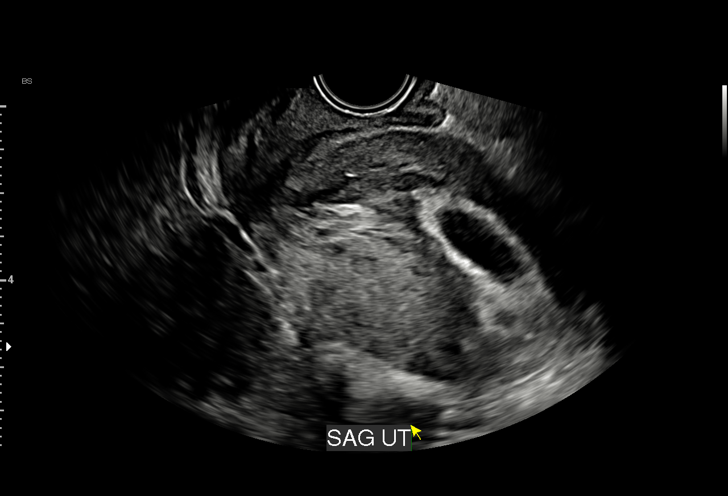
[im 29/47]
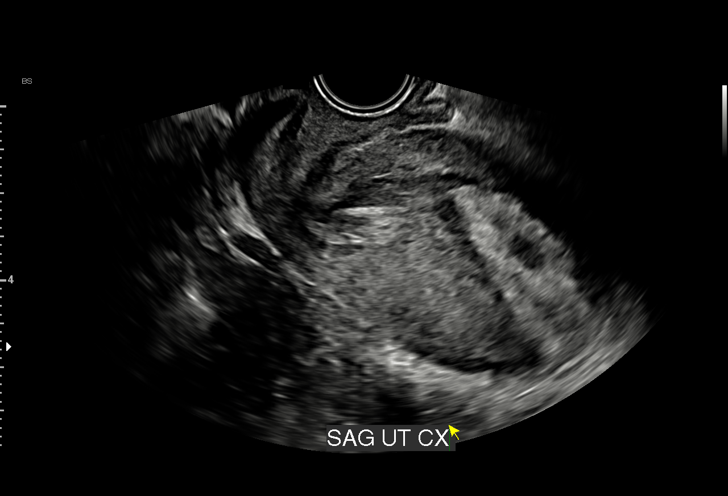
[im 33/47]
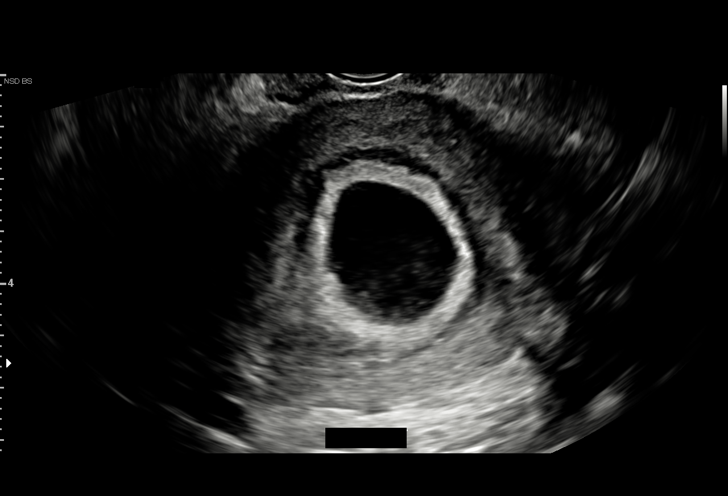
[im 36/47]
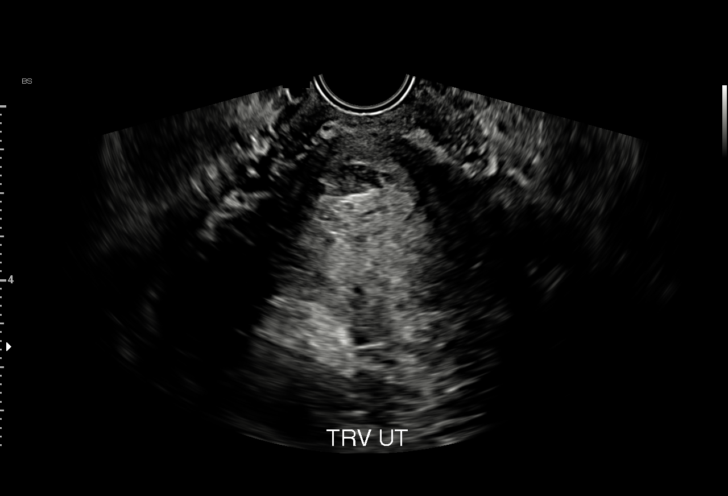
[im 40/47]
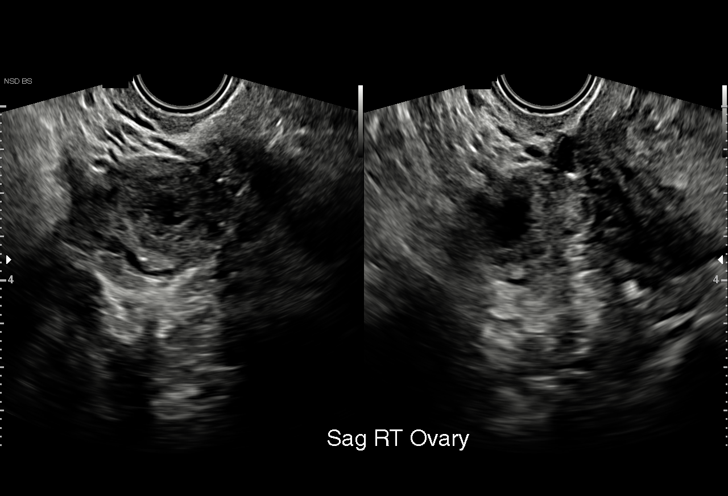
[im 43/47]
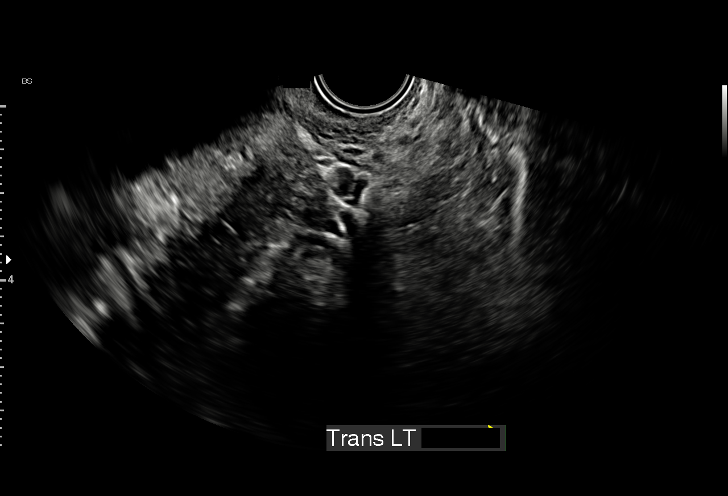
[im 47/47]
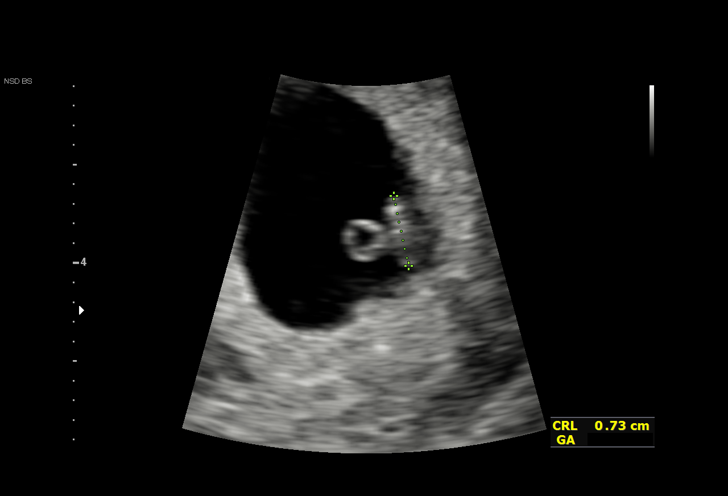

[15 of 28 positions shown; findings below may reference images not displayed]

FINDINGS: Intrauterine gestational sac: Single

Yolk sac:  Visualized.

Embryo:  Visualized.

Cardiac Activity: Visualized.

Heart Rate: 113 bpm

MSD:   mm    w     d

CRL:  7.3 mm   6 w   4 d                  US EDC: 04/22/2022

Subchorionic hemorrhage:  Large subchorionic hemorrhage.

Maternal uterus/adnexae: No adnexal mass or free fluid.
IMPRESSION: Six week 4 day intrauterine pregnancy. Fetal heart rate 113 beats
per minute. Large subchorionic hemorrhage.

## 2023-08-25 ENCOUNTER — Telehealth: Payer: Self-pay | Admitting: Pharmacist

## 2023-08-25 NOTE — Telephone Encounter (Signed)
 Appeal has been submitted for branded Synthroid .  Will advise when response is received, please be advised that most companies may take 30 days to make a decision. Appeal letter and supporting documentation were faxed to [phone] on 08/25/2023 at 5:04 pm.  Thank you, Devere Pandy, PharmD Clinical Pharmacist  [place]  Direct Dial: [phone]

## 2023-08-28 ENCOUNTER — Other Ambulatory Visit (HOSPITAL_COMMUNITY): Payer: Self-pay | Admitting: Psychiatry

## 2023-08-28 DIAGNOSIS — F410 Panic disorder [episodic paroxysmal anxiety] without agoraphobia: Secondary | ICD-10-CM

## 2023-08-28 DIAGNOSIS — F40240 Claustrophobia: Secondary | ICD-10-CM

## 2023-08-28 DIAGNOSIS — F603 Borderline personality disorder: Secondary | ICD-10-CM

## 2023-08-28 DIAGNOSIS — F3341 Major depressive disorder, recurrent, in partial remission: Secondary | ICD-10-CM

## 2023-08-28 DIAGNOSIS — F4310 Post-traumatic stress disorder, unspecified: Secondary | ICD-10-CM

## 2023-08-31 ENCOUNTER — Ambulatory Visit (INDEPENDENT_AMBULATORY_CARE_PROVIDER_SITE_OTHER): Payer: Commercial Managed Care - PPO | Admitting: General Surgery

## 2023-08-31 DIAGNOSIS — K8020 Calculus of gallbladder without cholecystitis without obstruction: Secondary | ICD-10-CM

## 2023-08-31 NOTE — Telephone Encounter (Signed)
Pharmacy Patient Advocate Encounter  Received notification from [facility] that Apeal for Synthroid has been DENIED.  Full denial letter will be uploaded to the media tab. See denial reason below.

## 2023-08-31 NOTE — Progress Notes (Signed)
[facility]  I am calling the patient for post operative evaluation. This is not a billable encounter as it is under the global charges for the surgery.  The patient had a robotic cholecystectomy on 1/16. She was having left sided pain.  She did not answer the phone.  Was calling to see if the left side port pain was improved.   Will see the patient PRN.   Algis Greenhouse, MD [facility] [address] [place], [place] [address] [phone] (office)

## 2023-08-31 NOTE — Telephone Encounter (Signed)
Can you look into?

## 2023-09-03 NOTE — Telephone Encounter (Signed)
Patient states she rather just pay out of pocket for it. She states she will call back and let us know were to send it she will pay out of pocket

## 2023-09-03 NOTE — Telephone Encounter (Signed)
She'd been on forever when I saw her.  My note at that time states she didn't absorb generic well which is why they switched her. Puls, can you please let the patient know that we are going to have to put her back on generic in efforts to get brand-name covered because they will not accept previously noted intolerances to generic Synthroid.  They are going to make her trial it and fill it again before they will pay for it

## 2023-09-10 ENCOUNTER — Other Ambulatory Visit (HOSPITAL_COMMUNITY): Payer: Self-pay | Admitting: Psychiatry

## 2023-09-10 DIAGNOSIS — Z8659 Personal history of other mental and behavioral disorders: Secondary | ICD-10-CM

## 2023-09-13 ENCOUNTER — Encounter: Payer: Self-pay | Admitting: Family Medicine

## 2023-09-16 ENCOUNTER — Other Ambulatory Visit: Payer: Self-pay | Admitting: Family Medicine

## 2023-09-16 DIAGNOSIS — Z7689 Persons encountering health services in other specified circumstances: Secondary | ICD-10-CM

## 2023-09-16 NOTE — Telephone Encounter (Signed)
 Marcia Gonzalez, can you give update on this.  Pts husband states that he spoke with insurance and they need documentation stating why she has have the brand name over the generic.

## 2023-09-16 NOTE — Telephone Encounter (Signed)
 This was ordered 07/28/2023 x42m. Not sure why we are getting a request?

## 2023-09-17 ENCOUNTER — Telehealth: Payer: Commercial Managed Care - PPO | Admitting: Physician Assistant

## 2023-09-17 DIAGNOSIS — R197 Diarrhea, unspecified: Secondary | ICD-10-CM

## 2023-09-17 NOTE — Progress Notes (Signed)
 Because of your persistent diarrhea, I feel your condition warrants further evaluation and I recommend that you be seen for a face to face visit.  You will need to have a physical exam and you may require lab testing and stool testing to rule out cdif. Please contact your primary care physician practice to be seen. Many offices offer virtual options to be seen via video if you are not comfortable going in person to a medical facility at this time.  NOTE: You will NOT be charged for this eVisit.  If you do not have a PCP, [facility] offers a free physician referral service available at [phone]. Our trained staff has the experience, knowledge and resources to put you in touch with a physician who is right for you.    If you are having a true medical emergency please call 911.   Your e-visit answers were reviewed by a board certified advanced clinical practitioner to complete your personal care plan.  Thank you for using e-Visits.  Approximately 5 minutes was spent documenting and reviewing patient's chart.

## 2023-09-20 ENCOUNTER — Ambulatory Visit (INDEPENDENT_AMBULATORY_CARE_PROVIDER_SITE_OTHER): Payer: Commercial Managed Care - PPO | Admitting: Family Medicine

## 2023-09-20 ENCOUNTER — Encounter: Payer: Self-pay | Admitting: Family Medicine

## 2023-09-20 VITALS — BP 103/69 | HR 87 | Temp 97.5°F | Ht 65.0 in | Wt 209.8 lb

## 2023-09-20 DIAGNOSIS — R197 Diarrhea, unspecified: Secondary | ICD-10-CM

## 2023-09-20 MED ORDER — AZITHROMYCIN 500 MG PO TABS: 500.0000 mg | ORAL_TABLET | Freq: Every day | ORAL | 0 refills | Status: AC

## 2023-09-20 NOTE — Progress Notes (Signed)
   Acute Office Visit  Subjective:     Patient ID: Marcia Gonzalez, female    DOB: October 15, 1995, 28 y.o.   MRN: [id number]  Chief Complaint  Patient presents with   Diarrhea    Diarrhea  This is a new problem. Episode onset: 3-4 weeks. The problem occurs more than 10 times per day. The problem has been gradually worsening. The stool consistency is described as Watery. The patient states that diarrhea awakens her from sleep. Associated symptoms include abdominal pain (intermittent, generalized), bloating, headaches and increased flatus. Pertinent negatives include no chills, coughing, fever, myalgias, URI or vomiting.   On wegovy for a few months. Decreased dosage in January and has been on this same dosage since. Gallbladder was removed in January. No diarrhea after this.  Denies dizziness, lightheadedness. Feels fatigued. No new medications. No sick contacts.    Review of Systems  Constitutional:  Negative for chills and fever.  Respiratory:  Negative for cough.   Gastrointestinal:  Positive for abdominal pain (intermittent, generalized), bloating, diarrhea and flatus. Negative for vomiting.  Musculoskeletal:  Negative for myalgias.  Neurological:  Positive for headaches.        Objective:    BP 103/69   Pulse 87   Temp (!) 97.5 F (36.4 C) (Temporal)   Ht 5\' 5"  (1.651 m)   Wt 209 lb 12.8 oz (95.2 kg)   SpO2 94%   BMI 34.91 kg/m  Wt Readings from Last 3 Encounters:  09/20/23 209 lb 12.8 oz (95.2 kg)  08/17/23 214 lb (97.1 kg)  08/05/23 219 lb 12.8 oz (99.7 kg)      Physical Exam Vitals and nursing note reviewed.  Constitutional:      General: She is not in acute distress.    Appearance: She is not ill-appearing, toxic-appearing or diaphoretic.  Pulmonary:     Effort: Pulmonary effort is normal. No respiratory distress.  Abdominal:     General: Bowel sounds are increased.     Palpations: Abdomen is soft.     Tenderness: There is no abdominal tenderness. There is  no guarding or rebound. Negative signs include Murphy's sign and McBurney's sign.  Skin:    General: Skin is warm and dry.  Neurological:     General: No focal deficit present.     Mental Status: She is alert and oriented to person, place, and time.  Psychiatric:        Mood and Affect: Mood normal.        Behavior: Behavior normal.     No results found for any visits on 09/20/23.      Assessment & Plan:   Dariel was seen today for diarrhea.  Diagnoses and all orders for this visit:  Diarrhea, unspecified type Labs pending as below. Benign exam. Will treat empirically with azithromycin. Start after collection of stool culture. Push hydration.  -     Cdiff NAA+O+P+Stool Culture -     azithromycin (ZITHROMAX) 500 MG tablet; Take 1 tablet (500 mg total) by mouth daily for 3 days. -     CBC with Differential/Platelet -     CMP14+EGFR -     TSH + free T4  Return if symptoms worsen or fail to improve.  The patient indicates understanding of these issues and agrees with the plan.   Gabriel Earing, FNP

## 2023-09-20 NOTE — Patient Instructions (Signed)
 Diarrhea, Adult Diarrhea is frequent loose and sometimes watery bowel movements. Diarrhea can make you feel weak and cause you to become dehydrated. Dehydration is a condition in which there is not enough water or other fluids in the body. Dehydration can make you tired and thirsty, cause you to have a dry mouth, and decrease how often you urinate. Diarrhea typically lasts 2-3 days. However, it can last longer if it is a sign of something more serious. It is important to treat your diarrhea as told by your health care provider. Follow these instructions at home: Eating and drinking     Follow these recommendations as told by your health care provider: Take an oral rehydration solution (ORS). This is an over-the-counter medicine that helps return your body to its normal balance of nutrients and water. It is found at pharmacies and retail stores. Drink enough fluid to keep your urine pale yellow. Drink fluids such as water, diluted fruit juice, and low-calorie sports drinks. You can drink milk also, if desired. Sucking on ice chips is another way to get fluids. Avoid drinking fluids that contain a lot of sugar or caffeine, such as soda, energy drinks, and regular sports drinks. Avoid alcohol. Eat bland, easy-to-digest foods in small amounts as you are able. These foods include bananas, applesauce, rice, lean meats, toast, and crackers. Avoid spicy or fatty foods.  Medicines Take over-the-counter and prescription medicines only as told by your health care provider. If you were prescribed antibiotics, take them as told by your health care provider. Do not stop using the antibiotic even if you start to feel better. General instructions  Wash your hands often using soap and water for at least 20 seconds. If soap and water are not available, use hand sanitizer. Others in the household should wash their hands as well. Hands should be washed: After using the toilet or changing a diaper. Before  preparing, cooking, or serving food. While caring for a sick person or while visiting someone in a hospital. Rest at home while you recover. Take a warm bath to relieve any burning or pain from frequent diarrhea episodes. Watch your condition for any changes. Contact a health care provider if: You have a fever. Your diarrhea gets worse. You have new symptoms. You vomit every time you eat or drink. You feel light-headed, dizzy, or have a headache. You have muscle cramps. You have signs of dehydration, such as: Dark urine, very little urine, or no urine. Cracked lips. Dry mouth. Sunken eyes. Sleepiness. Weakness. You have bloody or black stools or stools that look like tar. You have severe pain, cramping, or bloating in your abdomen. Your skin feels cold and clammy. You feel confused. Get help right away if: You have chest pain or your heart is beating very quickly. You have trouble breathing or you are breathing very quickly. You feel extremely weak or you faint. These symptoms may be an emergency. Get help right away. Call 911. Do not wait to see if the symptoms will go away. Do not drive yourself to the hospital. This information is not intended to replace advice given to you by your health care provider. Make sure you discuss any questions you have with your health care provider. Document Revised: 12/23/2021 Document Reviewed: 12/23/2021 Elsevier Patient Education  2024 [employer].

## 2023-09-21 LAB — CBC WITH DIFFERENTIAL/PLATELET
Basophils Absolute: 0 10*3/uL (ref 0.0–0.2)
Basos: 1 %
EOS (ABSOLUTE): 0.3 10*3/uL (ref 0.0–0.4)
Eos: 6 %
Hematocrit: 38.1 % (ref 34.0–46.6)
Hemoglobin: 12.6 g/dL (ref 11.1–15.9)
Immature Grans (Abs): 0 10*3/uL (ref 0.0–0.1)
Immature Granulocytes: 0 %
Lymphocytes Absolute: 1.9 10*3/uL (ref 0.7–3.1)
Lymphs: 36 %
MCH: 29.3 pg (ref 26.6–33.0)
MCHC: 33.1 g/dL (ref 31.5–35.7)
MCV: 89 fL (ref 79–97)
Monocytes Absolute: 0.5 10*3/uL (ref 0.1–0.9)
Monocytes: 9 %
Neutrophils Absolute: 2.6 10*3/uL (ref 1.4–7.0)
Neutrophils: 48 %
Platelets: 381 10*3/uL (ref 150–450)
RBC: 4.3 x10E6/uL (ref 3.77–5.28)
RDW: 13.1 % (ref 11.7–15.4)
WBC: 5.3 10*3/uL (ref 3.4–10.8)

## 2023-09-21 LAB — CMP14+EGFR
ALT: 13 IU/L (ref 0–32)
AST: 15 IU/L (ref 0–40)
Albumin: 4.6 g/dL (ref 4.0–5.0)
Alkaline Phosphatase: 68 IU/L (ref 44–121)
BUN/Creatinine Ratio: 10 (ref 9–23)
BUN: 8 mg/dL (ref 6–20)
Bilirubin Total: 0.5 mg/dL (ref 0.0–1.2)
CO2: 22 mmol/L (ref 20–29)
Calcium: 9.1 mg/dL (ref 8.7–10.2)
Chloride: 104 mmol/L (ref 96–106)
Creatinine, Ser: 0.78 mg/dL (ref 0.57–1.00)
Globulin, Total: 2 g/dL (ref 1.5–4.5)
Glucose: 91 mg/dL (ref 70–99)
Potassium: 4 mmol/L (ref 3.5–5.2)
Sodium: 140 mmol/L (ref 134–144)
Total Protein: 6.6 g/dL (ref 6.0–8.5)
eGFR: 108 mL/min/{1.73_m2} (ref >59)

## 2023-09-21 LAB — TSH+FREE T4
Free T4: 1.33 ng/dL (ref 0.82–1.77)
TSH: 2.15 u[IU]/mL (ref 0.450–4.500)

## 2023-09-23 IMAGING — US US OB TRANSVAGINAL
1 series · 15 of 28 positions shown · non-contrast
Comparison: 08/31/2021

CLINICAL DATA: History of multiple miscarriages

EXAM:
TRANSVAGINAL OB ULTRASOUND
TECHNIQUE: Transvaginal ultrasound was performed for complete evaluation of the
gestation as well as the maternal uterus, adnexal regions, and
pelvic cul-de-sac.

[Series 1: us ob transvaginal · 15 of 47 slices shown]
[im 1/47]
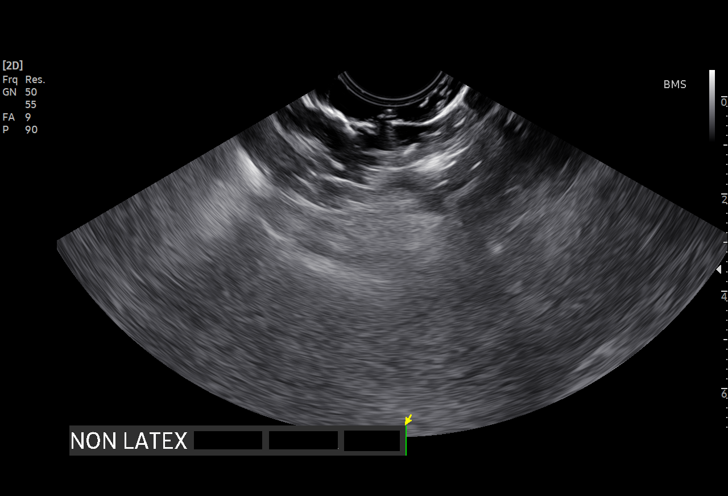
[im 4/47]
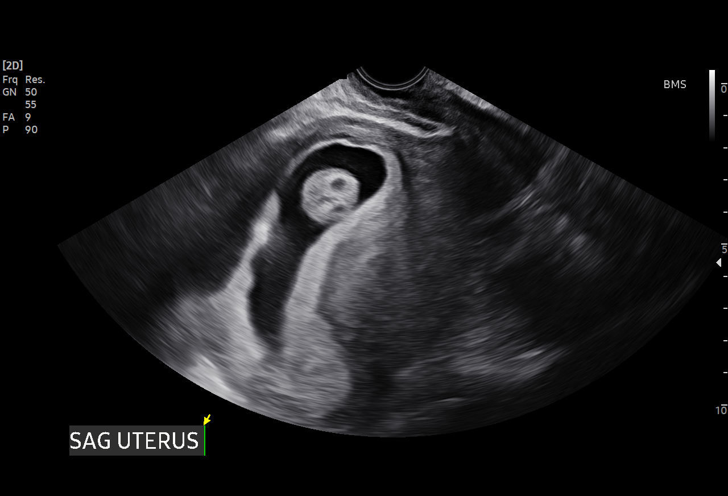
[im 7/47]
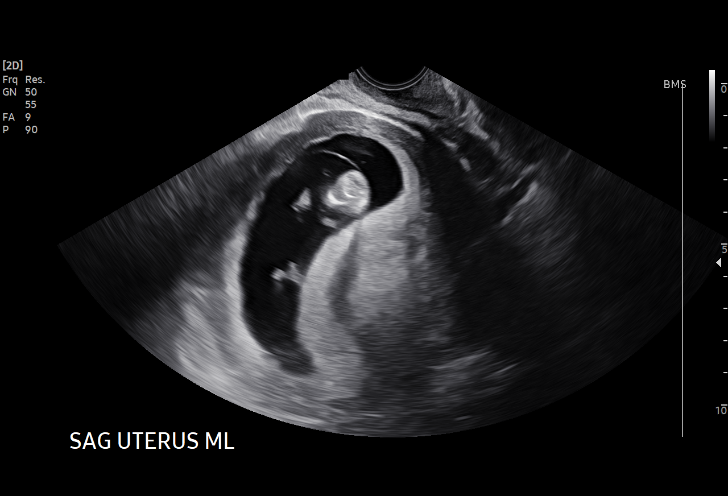
[im 11/47]
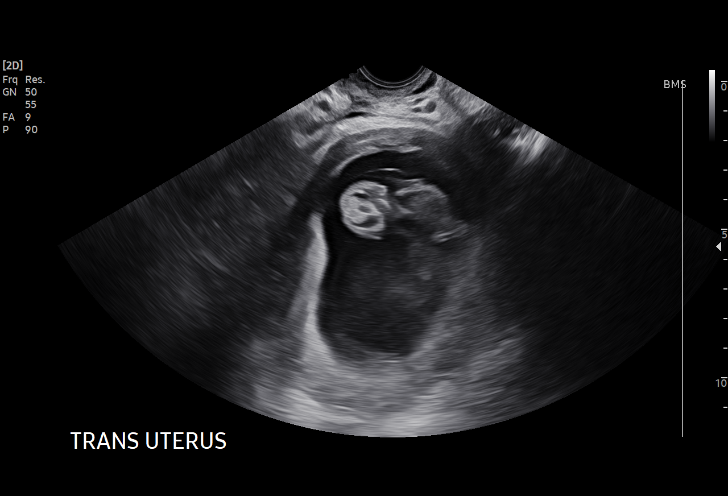
[im 14/47]
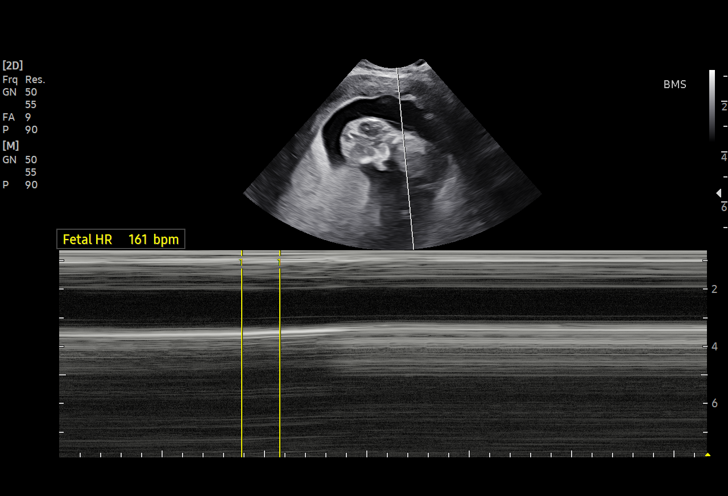
[im 18/47]
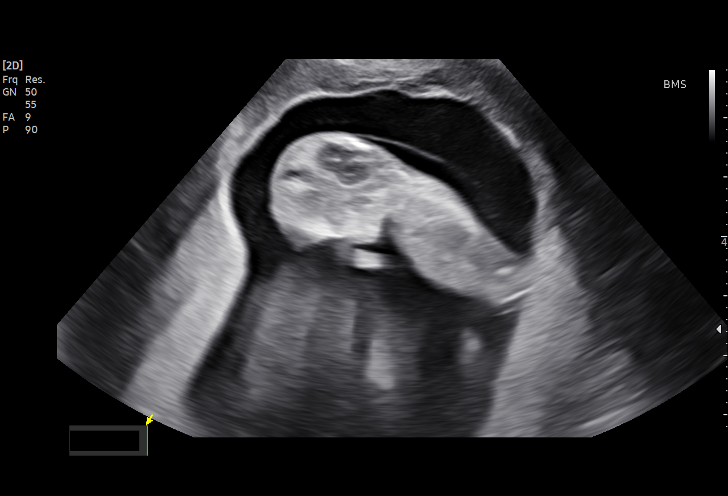
[im 21/47]
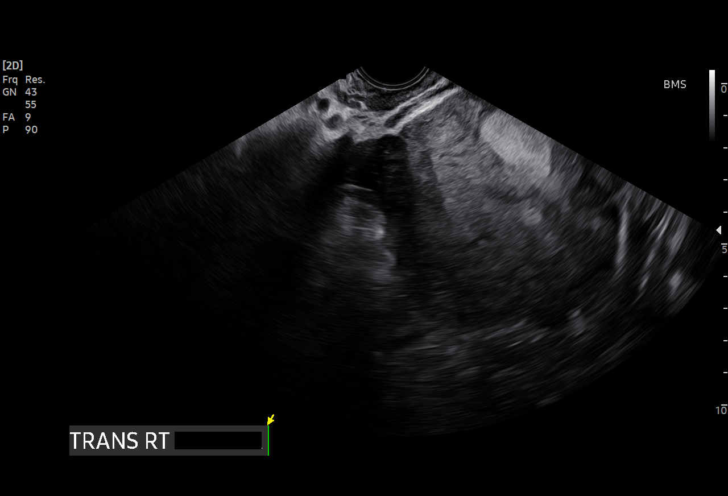
[im 24/47]
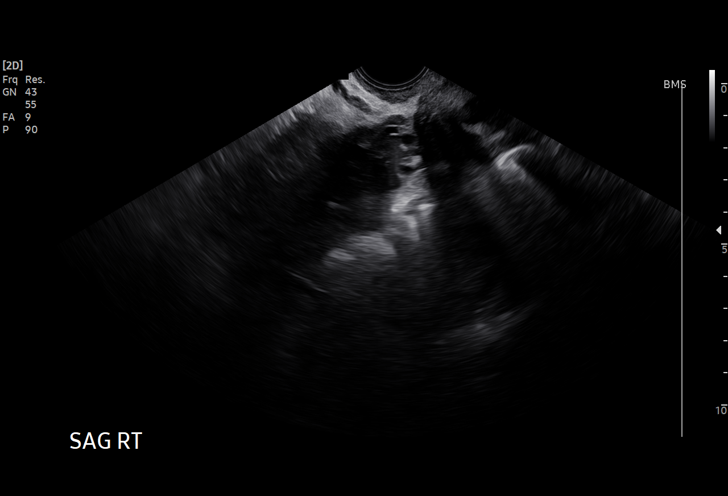
[im 26/47]
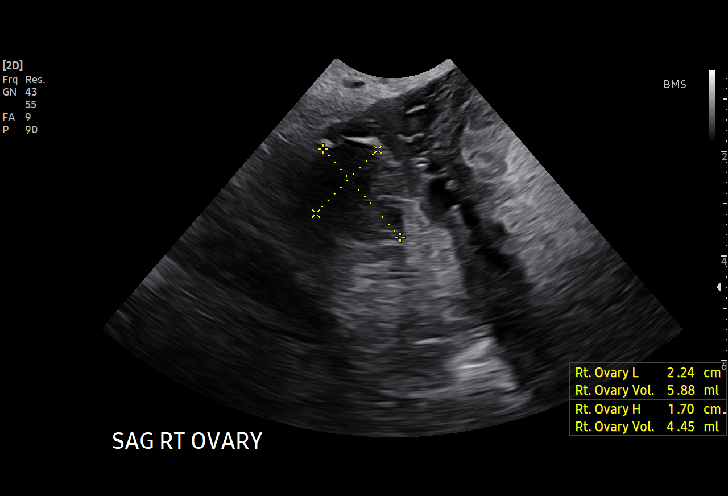
[im 29/47]
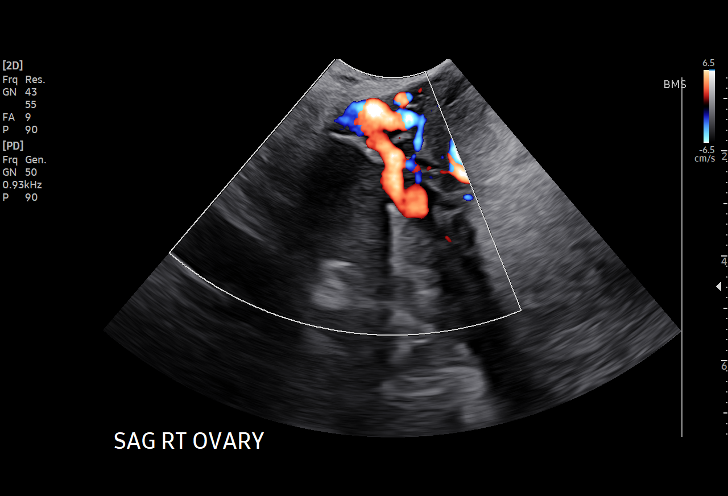
[im 33/47]
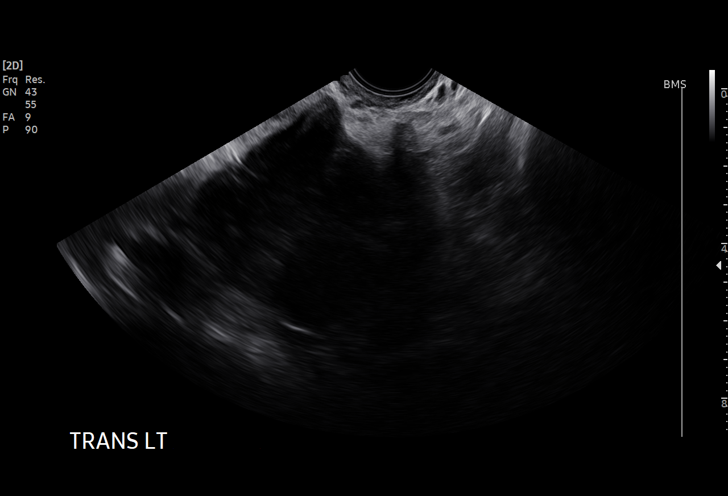
[im 36/47]
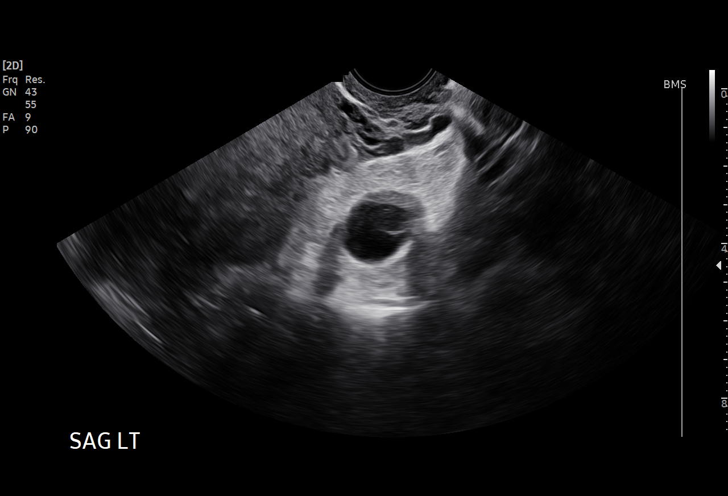
[im 40/47]
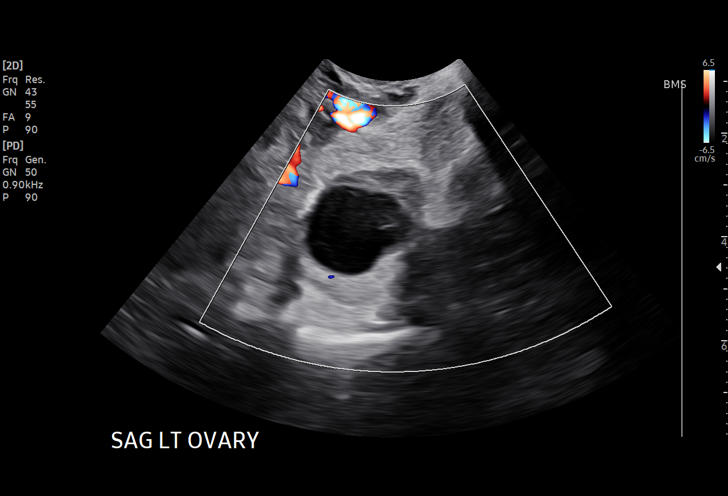
[im 43/47]
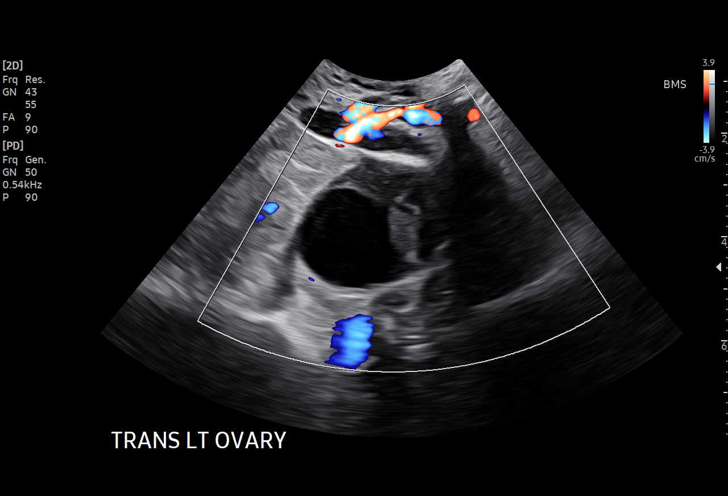
[im 47/47]
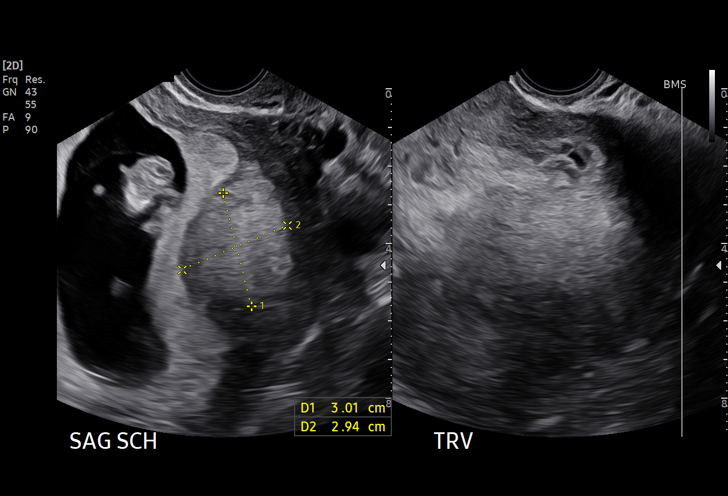

[15 of 28 positions shown; findings below may reference images not displayed]

FINDINGS: Intrauterine gestational sac: Single

Yolk sac:  Not Visualized.

Embryo:  Visualized.

Cardiac Activity: Visualized.

Heart Rate: 161 bpm

CRL:   44.5 mm   11 w 2 d                  US EDC: 04/20/2022

Subchorionic hemorrhage: Large subchorionic hemorrhage is again
seen, measuring 3.0 x 2.9 x 3.0 cm.

Maternal uterus/adnexae: Likely corpus luteum cyst left ovary. No
other adnexal masses or free fluid.
IMPRESSION: 1. Single live intrauterine pregnancy as above estimated age 11
weeks and 2 days.
2. Large subchorionic hemorrhage.

## 2023-09-24 LAB — CDIFF NAA+O+P+STOOL CULTURE
E coli, Shiga toxin Assay: NEGATIVE
Toxigenic C. Difficile by PCR: NEGATIVE

## 2023-09-27 ENCOUNTER — Encounter (HOSPITAL_COMMUNITY): Payer: Self-pay | Admitting: Psychiatry

## 2023-09-27 ENCOUNTER — Telehealth (INDEPENDENT_AMBULATORY_CARE_PROVIDER_SITE_OTHER): Payer: MEDICAID | Admitting: Psychiatry

## 2023-09-27 DIAGNOSIS — F3341 Major depressive disorder, recurrent, in partial remission: Secondary | ICD-10-CM

## 2023-09-27 DIAGNOSIS — F331 Major depressive disorder, recurrent, moderate: Secondary | ICD-10-CM

## 2023-09-27 DIAGNOSIS — F411 Generalized anxiety disorder: Secondary | ICD-10-CM

## 2023-09-27 DIAGNOSIS — F603 Borderline personality disorder: Secondary | ICD-10-CM

## 2023-09-27 DIAGNOSIS — E559 Vitamin D deficiency, unspecified: Secondary | ICD-10-CM

## 2023-09-27 DIAGNOSIS — F4310 Post-traumatic stress disorder, unspecified: Secondary | ICD-10-CM

## 2023-09-27 DIAGNOSIS — F410 Panic disorder [episodic paroxysmal anxiety] without agoraphobia: Secondary | ICD-10-CM

## 2023-09-27 MED ORDER — LAMOTRIGINE 25 MG PO TABS: 25.0000 mg | ORAL_TABLET | Freq: Every evening | ORAL | 2 refills | Status: DC
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
## 2023-09-27 NOTE — Patient Instructions (Signed)
 We decreased the lamotrigine to 25 mg nightly today.  As we discussed there are many possible reasons why he could be having hair loss including ongoing vitamin D deficiency, stress, Wegovy, and the lamotrigine.  Please continue to practice the behavioral techniques that we went over and utilize the DBT workbook while you are waiting for a referral to psychotherapy which was placed today.

## 2023-10-01 ENCOUNTER — Encounter: Payer: Self-pay | Admitting: Family Medicine

## 2023-10-01 ENCOUNTER — Telehealth: Admitting: Family Medicine

## 2023-10-01 DIAGNOSIS — Z7689 Persons encountering health services in other specified circumstances: Secondary | ICD-10-CM | POA: Diagnosis not present

## 2023-10-01 DIAGNOSIS — K219 Gastro-esophageal reflux disease without esophagitis: Secondary | ICD-10-CM | POA: Diagnosis not present

## 2023-10-01 DIAGNOSIS — K449 Diaphragmatic hernia without obstruction or gangrene: Secondary | ICD-10-CM

## 2023-10-01 MED ORDER — VOQUEZNA 20 MG PO TABS: 20.0000 mg | ORAL_TABLET | Freq: Every day | ORAL | 0 refills | Status: DC

## 2023-10-01 MED ORDER — WEGOVY 1 MG/0.5ML ~~LOC~~ SOAJ: 1.0000 mg | SUBCUTANEOUS | 1 refills | Status: DC

## 2023-10-01 NOTE — Patient Instructions (Signed)
 Savings Card  VOQUEZNA (vonoprazan)

## 2023-10-01 NOTE — Telephone Encounter (Signed)
 Ok to change

## 2023-10-01 NOTE — Progress Notes (Signed)
 MyChart Video visit  Subjective: CC: GERD PCP: Raliegh Ip, DO AOZ:[id number] Limb is a 28 y.o. female. Patient provides verbal consent for consult held via video.  Due to COVID-19 pandemic this visit was conducted virtually. This visit type was conducted due to national recommendations for restrictions regarding the COVID-19 Pandemic (e.g. social distancing, sheltering in place) in an effort to limit this patient's exposure and mitigate transmission in our community. All issues noted in this document were discussed and addressed.  A physical exam was not performed with this format.   Location of patient: home Location of provider: [facility] Others present for call: none  1. GERD/ Hiatal hernia Trialed off wegovy but no resolution in symptoms of severe heartburn.  She worries about developing Barrett's esophagus because her father had this.  She is taking Protonix BID, Pepcid BID, TUMS but symptoms present still. Had EGD done and told has a moderate/large hiatal hernia. Sees Dr Gwenevere Abbot [facility].  She was told that if it became large enough that they may consider surgical intervention.  However, she would like to just go ahead and be referred to the surgeon to discuss this further.  She reports no vomiting of blood.  No passage of blood in stool.    ROS: Per HPI  Allergies  Allergen Reactions   Codeine Nausea Only    Codeine based cough syrups    Percocet [Oxycodone-Acetaminophen] Hives and Itching    Pt tolerates oxycodone and tylenol separately    Amoxicillin-Pot Clavulanate Other (See Comments)    Hallucinations   Latex Other (See Comments)    Gets Blisters   Misoprostol     Left blisters in mouth   Pollen Extract Other (See Comments)   Tape Rash    Tolerates paper tape   Past Medical History:  Diagnosis Date   ADHD (attention deficit hyperactivity disorder)    Akathisia 10/05/2022   Allergy    Anemia    Anxiety    Arthritis    Asthma    Binge-eating disorder, in  partial remission, moderate 08/07/2022   Chronic hypertension affecting pregnancy 12/03/2021   Dx 12/03/2021 (by today's BP + ^home values); rx Labetalol 200mg  bid; growth q 4wks; 2x/wk testing @ 32wks; IOL 37-39wk   Constipation 08/14/2022   Depression    Encounter for supervision of high risk pregnancy in second trimester, antepartum 10/21/2021         FAMILY TREE     RESULTS  Language  English  Pap  (needs postpartum)  Initiated care at  13wks  GC/CT  Initial:  -/-          36wks:  Dating by  6wk [place]        Support person     Genetics  NT/IT: neg/neg    AFP:      Panorama: low risk female  BP cuff  rx'd  Carrier Screen  Neg 05/01/21        [place]/Hgb Elec     Rhogam  n/a        TDaP vaccine  02/11/22  Blood Type  O/Positive/-- (04/04 1619)  Flu v   Endometriosis    GERD (gastroesophageal reflux disease)    Hiatal hernia    History of borderline personality disorder    Hypertension    gestational   Hypothyroidism    IIH (idiopathic intracranial hypertension)    Insomnia 10/23/2022   Missed periods 09/22/2022   Polypharmacy 10/13/2022   Pregnancy examination or test, negative result 09/22/2022  Pregnancy induced hypertension    PTSD (post-traumatic stress disorder)    Suicidal ideation 08/07/2022   UTI (urinary tract infection)    Vaginal delivery 04/05/2022   Vitamin D deficiency 09/08/2022   Wells' syndrome     Current Outpatient Medications:    calcium carbonate (TUMS - DOSED IN MG ELEMENTAL CALCIUM) 500 MG chewable tablet, Chew 2-3 tablets by mouth daily as needed for heartburn., Disp: , Rfl:    citalopram (CELEXA) 40 MG tablet, TAKE 1 TABLET BY MOUTH EVERY DAY, Disp: 90 tablet, Rfl: 0   famotidine (PEPCID) 20 MG tablet, Take 20 mg by mouth daily., Disp: , Rfl:    Ferrous Sulfate (IRON PO), Take 1 tablet by mouth daily., Disp: , Rfl:    lamoTRIgine (LAMICTAL) 25 MG tablet, Take 1 tablet (25 mg total) by mouth at bedtime., Disp: 30 tablet, Rfl: 2   levonorgestrel (MIRENA, 52 MG,) 20  MCG/DAY IUD, 1 each by Intrauterine route once., Disp: , Rfl:    Multiple Vitamin (MULTIVITAMIN WITH MINERALS) TABS tablet, Take 1 tablet by mouth daily., Disp: , Rfl:    naproxen sodium (ALEVE) 220 MG tablet, Take 220 mg by mouth 2 (two) times daily as needed (pain)., Disp: , Rfl:    ondansetron (ZOFRAN) 4 MG tablet, Take 1 tablet (4 mg total) by mouth every 8 (eight) hours as needed., Disp: 30 tablet, Rfl: 1   pantoprazole (PROTONIX) 40 MG tablet, Take 1 tablet (40 mg total) by mouth daily as needed. (Patient taking differently: Take 40 mg by mouth 2 (two) times daily.), Disp: 90 tablet, Rfl: 3   Semaglutide-Weight Management (WEGOVY) 1 MG/0.5ML SOAJ, Inject 1 mg into the skin every 7 (seven) days. Dose reduction, Disp: 6 mL, Rfl: 1   SYNTHROID 88 MCG tablet, Take 1 tablet (88 mcg total) by mouth daily before breakfast., Disp: 90 tablet, Rfl: 3   Vitamin D, Ergocalciferol, (DRISDOL) 1.25 MG (50000 UNIT) CAPS capsule, Take 1 capsule (50,000 Units total) by mouth every 7 (seven) days., Disp: 12 capsule, Rfl: 3  Gen: well appearing female, NAD HEENT: sclera white, MMM  Assessment/ Plan: 28 y.o. female   Gastroesophageal reflux disease without esophagitis - Plan: Vonoprazan Fumarate (VOQUEZNA) 20 MG TABS, Ambulatory referral to General Surgery  Hiatal hernia - Plan: Vonoprazan Fumarate (VOQUEZNA) 20 MG TABS, Ambulatory referral to General Surgery  Morbid obesity (HCC) - Plan: Semaglutide-Weight Management (WEGOVY) 1 MG/0.5ML SOAJ  Encounter for weight management - Plan: Semaglutide-Weight Management (WEGOVY) 1 MG/0.5ML SOAJ  Okay to discontinue use of Protonix.  Try Voquezna totally milligrams for 2 months then can reduce to 10 mg daily if efficacious.  I have referred her to general surgery as per her request to discuss possible Nissen fundoplication though may certainly need to revisit eval with her gastroenterologist prior to making this decision  Sounds like she had no change in  symptomology with discontinuing her GLP so she may continue this for now.  Start time: 11:36am End time: 11:49am  Total time spent on patient care (including video visit/ documentation): 17 minutes  Saira Kramme Hulen Skains, DO [facility] [phone]

## 2023-10-08 ENCOUNTER — Encounter (HOSPITAL_COMMUNITY): Payer: Self-pay

## 2023-10-08 ENCOUNTER — Telehealth (HOSPITAL_COMMUNITY): Payer: Self-pay

## 2023-10-08 DIAGNOSIS — F410 Panic disorder [episodic paroxysmal anxiety] without agoraphobia: Secondary | ICD-10-CM

## 2023-10-08 MED ORDER — HYDROXYZINE HCL 25 MG PO TABS: 25.0000 mg | ORAL_TABLET | Freq: Two times a day (BID) | ORAL | 0 refills | Status: DC | PRN

## 2023-10-08 NOTE — Telephone Encounter (Addendum)
 Jeri Modena pt's spouse and Subrena called in to inquire about if something can be added for pt's anxiety short term due to pt's grandpa being taking off all assisted devices today in hospital and pt is having a hard time right now. Please advise.   Called Jeri Modena back but did not answer. Sent mychart message outlining addition of hydroxyzine 25mg  twice daily as needed for panic.

## 2023-10-13 ENCOUNTER — Telehealth: Payer: Self-pay

## 2023-10-13 NOTE — Telephone Encounter (Signed)
 Copied from CRM [id number]. Topic: Clinical - Medication Question >> Oct 13, 2023  2:27 PM Fuller Mandril wrote: Reason for CRM: Patient called states she would like speak to Dr Nadine Counts nurse regarding medication. Would like to know if she should continue taking medication or come off of them. State she is trying to get pregnant and wants to makes sure medications are safe and do not cause miscarriage or harm to baby. Thank You

## 2023-10-16 ENCOUNTER — Encounter: Payer: Self-pay | Admitting: Family Medicine

## 2023-10-18 NOTE — Telephone Encounter (Signed)
 Messaged via mychart.

## 2023-10-20 ENCOUNTER — Ambulatory Visit (HOSPITAL_COMMUNITY): Payer: MEDICAID | Admitting: Psychiatry

## 2023-10-27 ENCOUNTER — Telehealth: Payer: Self-pay | Admitting: Family Medicine

## 2023-10-27 DIAGNOSIS — F332 Major depressive disorder, recurrent severe without psychotic features: Secondary | ICD-10-CM

## 2023-10-27 NOTE — Telephone Encounter (Signed)
 Front office does not have access to view mental health info. Its hidden. Can Dr Nadine Counts or lead nurse advise on this so that I can call the patient back about scheduling, please.

## 2023-10-27 NOTE — Telephone Encounter (Signed)
 Pt called to make an appt with Esmond Harps. Says she was recommended to see him months ago but says she never did because there was issues with his schedule at that time.  I didn't see an order in her chart to see Arlys John, but that could be because its mental health info and a lot of times mental health info is listed as confidential and only certain people can see it in the chart.  Can someone who has access, confirm that it is okay to make her an appt with Arlys John?

## 2023-10-28 NOTE — Telephone Encounter (Signed)
 Patient and/or legal guardian verbally consented to [facility] services about presenting concerns and psychiatric consultation as appropriate.  The services will be billed as appropriate for the patient   Orders Placed This Encounter  Procedures   Ambulatory referral to Integrated Behavioral Health

## 2023-10-29 NOTE — Telephone Encounter (Signed)
 Will schedule Patient and call with Appt :)

## 2023-10-31 ENCOUNTER — Telehealth: Admitting: Nurse Practitioner

## 2023-10-31 DIAGNOSIS — K629 Disease of anus and rectum, unspecified: Secondary | ICD-10-CM | POA: Diagnosis not present

## 2023-10-31 MED ORDER — VALACYCLOVIR HCL 500 MG PO TABS: 500.0000 mg | ORAL_TABLET | Freq: Two times a day (BID) | ORAL | 0 refills | Status: AC

## 2023-10-31 NOTE — Progress Notes (Signed)
 E-Visit for Herpes Simplex  We are sorry that you are not feeling well.  Here is how we plan to help!  Based on what you have shared ith me, it looks like you may be having an outbreak/flare-up of genital herpes. I recommend you follow up with Dr. Bonnell Butcher on Monday and see if they can schedule your for a lab draw and test for HSV through blood to rule HSV in our out.   I have prescribed I have prescribed Valacyclovir 500 mg Take one by mouth twice a day for 3 days.    If you have been prescribed long term medications to be taken on a regular basis, it is important to follow the recommendations and take them as ordered.    Outbreaks usually include blisters and open sores in the genital area. Outbreaks that happen after the first time are usually not as severe and do not last as long. Genital Herpes Simplex is a commonly sexually transmitted viral infection that is found worldwide. Most of these genital infections are caused by one or two herpes simplex viruses that is passed from person to person during vaginal, oral, or anal sex. Sometimes, people do not know they have herpes because they do not have any symptoms.  Please be aware that if you have genital herpes you can be contagious even when you are not having rash or flare-up and you may not have any symptoms, even when you are taking suppressive medicines.  Herpes cannot be cured. The disease usually causes most problems during the first few years. After that, the virus is still there, but it causes few to no symptoms. Even when the virus is active, people with herpes can take medicines to reduce and help prevent symptoms.  Herpes is an infection that can cause blisters and open sores on the genital area. Herpes is caused by a virus that is passed from person to person during vaginal, oral, or anal sex. Sometimes, people do not know they have herpes because they do not have any symptoms. Herpes cannot be cured. The disease usually causes most  problems during the first few years. After that, the virus is still there, but it causes few to no symptoms. Even when the virus is active, people with herpes can take medicines to reduce and help prevent symptoms.  If you have been prescribed medications to be taken on a regular basis, it is important to follow the recommendations and take them as ordered.  Some people with herpes never have any symptoms. But other people can develop symptoms within a few weeks of being infected with the herpes virus   Symptoms usually include blisters in the genital area. In women, this area includes the vagina, buttocks, anus, or thighs. In men, this area includes the penis, scrotum, anus, butt, or thighs. The blisters can become painful open sores, which then crust over as they heal. Sometimes, people can have other symptoms that include:  ?Blisters on the mouth or lips ?Fever, headache, or pain in the joints ?Trouble urinating  Outbreaks might occur every month or more often, or just once or twice a year. Sometimes, people can tell when an outbreak will occur, because they feel itching or pain beforehand. Sometimes they do not know that an outbreak is coming because they have no symptoms. Whatever your pattern is, keep in mind that herpes outbreaks usually become less frequent over time as you get older. Certain things, called "triggers," can make outbreaks more likely to occur. These  include stress, sunlight, menstrual periods,or getting sick.  Antiviral therapy can shorten the duration of symptoms and signs in primary infection, which, when untreated, can be associated with significant increase in the symptoms of the disease.  HOME CARE Use a portable bath (such as a "Sitz bath") where you can sit in warm water for about 20 minutes. Your bathtub could also work. Avoid bubble baths.  Keep the genital area clean and dry and avoid tight clothes.  Take over-the-counter pain medicine such as acetaminophen  (brand name: Tylenol) or ibuprofen sample brand names: Advil, Motrin). But avoid aspirin.  Only take medications as instructed by your medical team.  You are most likely to spread herpes to a sex partner when you have blisters and open sores on your body. But it's also possible to spread herpes to your partner when you do not have any symptoms. That is because herpes can be present on your body without causing any symptoms, like blisters or pain.  Telling your sex partner that you have herpes can be hard. But it can help protect them, since there are ways to lower the risk of spreading the infection.   Using a condom every time you have sex  Not having sex when you have symptoms  Not having oral sex if you have blisters or open sores (in the genital area or around your mouth)  MAKE SURE YOU   Understand these instructions. Do not have sex without using a condom until you have been seen by a doctor and as instructed by the provider If you are not better or improved within 7 days, you MUST have a follow up at your doctor or the health department for evaluation. There are other causes of rashes in the genital region.  Thank you for choosing an e-visit.  Your e-visit answers were reviewed by a board certified advanced clinical practitioner to complete your personal care plan. Depending upon the condition, your plan could have included both over the counter or prescription medications.  Please review your pharmacy choice. Make sure the pharmacy is open so you can pick up prescription now. If there is a problem, you may contact your provider through [employer] and have the prescription routed to another pharmacy.  Your safety is important to us . If you have drug allergies check your prescription carefully.   For the next 24 hours you can use MyChart to ask questions about today's visit, request a non-urgent call back, or ask for a work or school excuse. You will get an email in the next  two days asking about your experience. I hope that your e-visit has been valuable and will speed your recovery.

## 2023-10-31 NOTE — Progress Notes (Signed)
 I have spent 5 minutes in review of e-visit questionnaire, review and updating patient chart, medical decision making and response to patient.   Claiborne Rigg, NP

## 2023-11-02 ENCOUNTER — Encounter (HOSPITAL_COMMUNITY): Payer: Self-pay

## 2023-11-02 ENCOUNTER — Telehealth (HOSPITAL_COMMUNITY): Payer: MEDICAID | Admitting: Psychiatry

## 2023-11-04 ENCOUNTER — Telehealth (HOSPITAL_COMMUNITY): Payer: MEDICAID | Admitting: Psychiatry

## 2023-11-04 ENCOUNTER — Encounter (HOSPITAL_COMMUNITY): Payer: Self-pay | Admitting: Psychiatry

## 2023-11-04 DIAGNOSIS — F411 Generalized anxiety disorder: Secondary | ICD-10-CM

## 2023-11-04 DIAGNOSIS — E559 Vitamin D deficiency, unspecified: Secondary | ICD-10-CM

## 2023-11-04 DIAGNOSIS — F410 Panic disorder [episodic paroxysmal anxiety] without agoraphobia: Secondary | ICD-10-CM

## 2023-11-04 DIAGNOSIS — F603 Borderline personality disorder: Secondary | ICD-10-CM | POA: Diagnosis not present

## 2023-11-04 DIAGNOSIS — F4310 Post-traumatic stress disorder, unspecified: Secondary | ICD-10-CM | POA: Diagnosis not present

## 2023-11-04 DIAGNOSIS — Z8659 Personal history of other mental and behavioral disorders: Secondary | ICD-10-CM

## 2023-11-04 DIAGNOSIS — F331 Major depressive disorder, recurrent, moderate: Secondary | ICD-10-CM | POA: Diagnosis not present

## 2023-11-04 DIAGNOSIS — F3341 Major depressive disorder, recurrent, in partial remission: Secondary | ICD-10-CM

## 2023-11-04 MED ORDER — LAMOTRIGINE 100 MG PO TABS: ORAL_TABLET | ORAL | 2 refills | Status: DC

## 2023-11-04 NOTE — Patient Instructions (Addendum)
 We increased the lamotrigine to 50 mg nightly for the next week (half a tablet) we will plan to increase to 100 mg nightly (full tablet) thereafter.  Until you are sleeping more sufficiently please cut the Celexa in half for a dose of 20 mg daily.  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  Lamotrigine safety data sheet in pregnancy: http://jones-macias.info/  Citalopram safety data sheet in pregnancy: http://www.bridges.com/
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
## 2023-11-12 ENCOUNTER — Telehealth: Payer: Self-pay | Admitting: Family Medicine

## 2023-11-12 ENCOUNTER — Ambulatory Visit (INDEPENDENT_AMBULATORY_CARE_PROVIDER_SITE_OTHER): Admitting: Family Medicine

## 2023-11-12 ENCOUNTER — Encounter: Payer: Self-pay | Admitting: Family Medicine

## 2023-11-12 VITALS — BP 112/62 | HR 72 | Temp 98.3°F | Ht 65.0 in | Wt 216.0 lb

## 2023-11-12 DIAGNOSIS — L659 Nonscarring hair loss, unspecified: Secondary | ICD-10-CM

## 2023-11-12 DIAGNOSIS — L0292 Furuncle, unspecified: Secondary | ICD-10-CM

## 2023-11-12 DIAGNOSIS — M546 Pain in thoracic spine: Secondary | ICD-10-CM | POA: Diagnosis not present

## 2023-11-12 MED ORDER — CLINDAMYCIN PHOSPHATE 1 % EX SWAB: CUTANEOUS | 12 refills | Status: DC

## 2023-11-12 MED ORDER — METHOCARBAMOL 500 MG PO TABS
500.0000 mg | ORAL_TABLET | Freq: Three times a day (TID) | ORAL | 0 refills | Status: DC | PRN
Start: 2023-11-12 — End: 2023-12-27

## 2023-11-12 NOTE — Patient Instructions (Signed)
 Hidradenitis Suppurativa    Hidradenitis suppurativa is a long-term (chronic) skin disease. It is similar to a severe form of acne, but it affects areas of the body where acne would be unusual, especially areas of the body where skin rubs against skin and becomes moist. These include:  Underarms.  Groin.  Genital area.  Buttocks.  Upper thighs.  Breasts.  Hidradenitis suppurativa may start out as small lumps or pimples caused by blocked skin pores, sweat glands, or hair follicles. Pimples may develop into deep sores that break open (rupture) and drain pus. Over time, affected areas of skin may thicken and become scarred. This condition is rare and does not spread from person to person (non-contagious).  What are the causes?  The exact cause of this condition is not known. It may be related to:  Female and female hormones.  An overactive disease-fighting system (immune system). The immune system may over-react to blocked hair follicles or sweat glands and cause swelling and pus-filled sores.  What increases the risk?  You are more likely to develop this condition if you:  Are female.  Are 23-75 years old.  Have a family history of hidradenitis suppurativa.  Have a personal history of acne.  Are overweight.  Smoke.  Take the medicine lithium.  What are the signs or symptoms?  The first symptoms are usually painful bumps in the skin, similar to pimples. The condition may get worse over time (progress), or it may only cause mild symptoms. If the disease progresses, symptoms may include:  Skin bumps getting bigger and growing deeper into the skin.  Bumps rupturing and draining pus.  Itchy, infected skin.  Skin getting thicker and scarred.  Tunnels under the skin (fistulas) where pus drains from a bump.  Pain during daily activities, such as pain during walking if your groin area is affected.  Emotional problems, such as stress or depression. This condition may affect your appearance and your ability or willingness to wear  certain clothes or do certain activities.  How is this diagnosed?  This condition is diagnosed by a health care provider who specializes in skin conditions (dermatologist). You may be diagnosed based on:  Your symptoms and medical history.  A physical exam.  Testing a pus sample for infection.  Blood tests.  How is this treated?  Your treatment will depend on how severe your symptoms are. The same treatment will not work for everybody with this condition. You may need to try several treatments to find what works best for you. Treatment may include:  Cleaning and bandaging (dressing) your wounds as needed.  Lifestyle changes, such as new skin care routines.  Taking medicines, such as:  Antibiotics.  Acne medicines.  Medicines to reduce the activity of the immune system.  A diabetes medicine (metformin).  Birth control pills, for women.  Steroids to reduce swelling and pain.  Working with a mental health care provider, if you experience emotional distress due to this condition.  If you have severe symptoms that do not get better with medicine, you may need surgery. Surgery may involve:  Using a laser to clear the skin and remove hair follicles.  Opening and draining deep sores.  Removing the areas of skin that are diseased and scarred.  Follow these instructions at home:  Medicines    Take over-the-counter and prescription medicines only as told by your health care provider.  If you were prescribed antibiotics, take them as told by your health care provider. Do not  stop using the antibiotic even if your condition improves.  Skin care  If you have open wounds, cover them with a clean dressing as told by your health care provider. Keep wounds clean by washing them gently with soap and water when you bathe.  Do not shave the areas where you get hidradenitis suppurativa.  Wear loose-fitting clothes.  Try to avoid getting overheated or sweaty. If you get sweaty or wet, change into clean, dry clothes as soon as you can.  To  help relieve pain and itchiness, cover sore areas with a warm, clean washcloth (warm compress) for 5-10 minutes as often as needed.  Your healthcare provider may recommend an antiperspirant deodorant that may be gentle on your skin.  A daily antiseptic wash to cleanse affected areas may be suggested by your healthcare provider.  General instructions  Learn as much as you can about your disease so that you have an active role in your treatment. Work closely with your health care provider to find treatments that work for you.  If you are overweight, work with your health care provider to lose weight as recommended.  Do not use any products that contain nicotine or tobacco. These products include cigarettes, chewing tobacco, and vaping devices, such as e-cigarettes. If you need help quitting, ask your health care provider.  If you struggle with living with this condition, talk with your health care provider or work with a mental health care provider as recommended.  Keep all follow-up visits.  Where to find more information  Hidradenitis Suppurativa Foundation, Inc.: www.hs-foundation.org  American Academy of Dermatology: InfoExam.si  Contact a health care provider if:  You have a flare-up of hidradenitis suppurativa.  You have a fever or chills.  You have trouble controlling your symptoms at home.  You have trouble doing your daily activities because of your symptoms.  You have trouble dealing with emotional problems related to your condition.  Summary  Hidradenitis suppurativa is a long-term (chronic) skin disease. It is similar to a severe form of acne, but it affects areas of the body where acne would be unusual.  The first symptoms are usually painful bumps in the skin, similar to pimples. The condition may only cause mild symptoms, or it may get worse over time (progress).  If you have open wounds, cover them with a clean dressing as told by your health care provider. Keep wounds clean by washing them gently with  soap and water when you bathe.  Besides skin care, treatment may include medicines, laser treatment, and surgery.  This information is not intended to replace advice given to you by your health care provider. Make sure you discuss any questions you have with your health care provider.  Document Revised: 08/27/2021 Document Reviewed: 08/27/2021  Elsevier Patient Education  2024 [employer].

## 2023-11-12 NOTE — Progress Notes (Signed)
 Subjective: CC: Skin issues PCP: Eliodoro Guerin, DO [id number] Zarling is a 28 y.o. female presenting to clinic today for:  1.  Skin issues She reports that she has been having recurrent blisters forming on her buttock area.  This been ongoing for quite some time and she did a virtual visit with a provider recently who thought maybe this would have been HSV.  She denies having however had any sexual partners except for her husband.  She is not sure if she was tested for HSV during her last pregnancy or not but she had no known exposures.  2.  Back pain She reports that she was lifting something a couple of weeks ago and started having really bad back pain.  She points to the center of her back as the area of discomfort.  She is been utilizing max dose Tylenol  and ibuprofen  together as well as Salonpas patches in efforts to alleviate pain but it really has not resolved and still happens with certain movements.  She is considered seeing a chiropractor but she was not sure if this was okay to do.  She does not report any weakness in the upper extremities.   ROS: Per HPI  Allergies  Allergen Reactions   Codeine Nausea Only    Codeine based cough syrups    Percocet [Oxycodone -Acetaminophen ] Hives and Itching    Pt tolerates oxycodone  and tylenol  separately    Amoxicillin-Pot Clavulanate Other (See Comments)    Hallucinations   Latex Other (See Comments)    Gets Blisters   Misoprostol      Left blisters in mouth   Pollen Extract Other (See Comments)   Tape Rash    Tolerates paper tape   Past Medical History:  Diagnosis Date   ADHD (attention deficit hyperactivity disorder)    Akathisia 10/05/2022   Allergy    Anemia    Anxiety    Arthritis    Asthma    Binge-eating disorder, in partial remission, moderate 08/07/2022   Chronic hypertension affecting pregnancy 12/03/2021   Dx 12/03/2021 (by today's BP + ^home values); rx Labetalol  200mg  bid; growth q 4wks; 2x/wk testing @  32wks; IOL 37-39wk   Constipation 08/14/2022   Depression    Encounter for supervision of high risk pregnancy in second trimester, antepartum 10/21/2021         FAMILY TREE     RESULTS  Language  English  Pap  (needs postpartum)  Initiated care at  13wks  GC/CT  Initial:  -/-          36wks:  Dating by  6wk US         Support person     Genetics  NT/IT: neg/neg    AFP:      Panorama: low risk female  BP cuff  rx'd  Carrier Screen  Neg 05/01/21        [place]/Hgb Elec     Rhogam  n/a        TDaP vaccine  02/11/22  Blood Type  O/Positive/-- (04/04 1619)  Flu v   Endometriosis    GERD (gastroesophageal reflux disease)    Hiatal hernia    History of borderline personality disorder    Hypertension    gestational   Hypothyroidism    IIH (idiopathic intracranial hypertension)    Insomnia 10/23/2022   Missed periods 09/22/2022   Polypharmacy 10/13/2022   Pregnancy examination or test, negative result 09/22/2022   Pregnancy induced hypertension    PTSD (post-traumatic stress  disorder)    Suicidal ideation 08/07/2022   UTI (urinary tract infection)    Vaginal delivery 04/05/2022   Vitamin D  deficiency 09/08/2022   Wells' syndrome     Current Outpatient Medications:    calcium carbonate (TUMS - DOSED IN MG ELEMENTAL CALCIUM) 500 MG chewable tablet, Chew 2-3 tablets by mouth daily as needed for heartburn., Disp: , Rfl:    citalopram  (CELEXA ) 40 MG tablet, TAKE 1 TABLET BY MOUTH EVERY DAY, Disp: 90 tablet, Rfl: 0   famotidine  (PEPCID ) 20 MG tablet, Take 20 mg by mouth daily., Disp: , Rfl:    Ferrous Sulfate (IRON PO), Take 1 tablet by mouth daily., Disp: , Rfl:    hydrOXYzine  (ATARAX ) 25 MG tablet, Take 1 tablet (25 mg total) by mouth 2 (two) times daily as needed for anxiety (panic)., Disp: 30 tablet, Rfl: 0   lamoTRIgine  (LAMICTAL ) 100 MG tablet, Take half a tablet for one week then increase to a full tablet nightly thereafter., Disp: 30 tablet, Rfl: 2   Multiple Vitamin (MULTIVITAMIN WITH MINERALS)  TABS tablet, Take 1 tablet by mouth daily., Disp: , Rfl:    naproxen sodium (ALEVE) 220 MG tablet, Take 220 mg by mouth 2 (two) times daily as needed (pain)., Disp: , Rfl:    ondansetron  (ZOFRAN ) 4 MG tablet, Take 1 tablet (4 mg total) by mouth every 8 (eight) hours as needed., Disp: 30 tablet, Rfl: 1   pantoprazole  (PROTONIX ) 40 MG tablet, Take 1 tablet (40 mg total) by mouth daily as needed. (Patient taking differently: Take 40 mg by mouth 2 (two) times daily.), Disp: 90 tablet, Rfl: 3   SYNTHROID  88 MCG tablet, Take 1 tablet (88 mcg total) by mouth daily before breakfast., Disp: 90 tablet, Rfl: 3   Vitamin D , Ergocalciferol , (DRISDOL ) 1.25 MG (50000 UNIT) CAPS capsule, Take 1 capsule (50,000 Units total) by mouth every 7 (seven) days., Disp: 12 capsule, Rfl: 3   Vonoprazan Fumarate  (VOQUEZNA ) 20 MG TABS, Take 20 mg by mouth daily. Refractory to Protonix , Pepcid  and Tums, Disp: 60 tablet, Rfl: 0 Social History   Socioeconomic History   Marital status: Married    Spouse name: Lina Render   Number of children: 1   Years of education: 12   Highest education level: High school graduate  Occupational History   Not on file  Tobacco Use   Smoking status: Never    Passive exposure: Never   Smokeless tobacco: Never  Vaping Use   Vaping status: Never Used  Substance and Sexual Activity   Alcohol use: Not Currently   Drug use: Never   Sexual activity: Yes    Birth control/protection: None, Condom  Other Topics Concern   Not on file  Social History Narrative   Not on file   Social Drivers of Health   Financial Resource Strain: Low Risk  (04/13/2022)   Overall Financial Resource Strain (CARDIA)    Difficulty of Paying Living Expenses: Not very hard  Food Insecurity: No Food Insecurity (04/13/2022)   Hunger Vital Sign    Worried About Running Out of Food in the Last Year: Never true    Ran Out of Food in the Last Year: Never true  Transportation Needs: No Transportation Needs (04/13/2022)    PRAPARE - Administrator, Civil Service (Medical): No    Lack of Transportation (Non-Medical): No  Physical Activity: Insufficiently Active (04/13/2022)   Exercise Vital Sign    Days of Exercise per Week: 1 day  Minutes of Exercise per Session: 10 min  Stress: No Stress Concern Present (04/13/2022)   [employer] of Occupational Health - Occupational Stress Questionnaire    Feeling of Stress : Only a little  Social Connections: Unknown (11/09/2022)   Received from [facility], [facility]   Social Network    Social Network: Not on file  Intimate Partner Violence: Not At Risk (07/08/2023)   Received from [facility]   HITS    Over the last 12 months how often did your partner physically hurt you?: Never    Over the last 12 months how often did your partner insult you or talk down to you?: Never    Over the last 12 months how often did your partner threaten you with physical harm?: Never    Over the last 12 months how often did your partner scream or curse at you?: Never   Family History  Problem Relation Age of Onset   COPD Maternal Grandmother    Arthritis Maternal Grandmother    Depression Father    Anxiety disorder Father    Kidney disease Mother    Hypertension Mother    Hyperlipidemia Mother    Depression Mother    Cancer Mother        cervical   Arthritis Mother    Anxiety disorder Mother    Asthma Brother    Vesicoureteral reflux Son     Objective: Office vital signs reviewed. BP 112/62   Pulse 72   Temp 98.3 F (36.8 C)   Ht 5\' 5"  (1.651 m)   Wt 216 lb (98 kg)   SpO2 96%   Breastfeeding No   BMI 35.94 kg/m   Physical Examination:  General: Awake, alert, well nourished, No acute distress HEENT: sclera white, MMM Cardio: regular rate and rhythm, S1S2 heard, no murmurs appreciated Pulm: clear to auscultation bilaterally, no wheezes, rhonchi or rales; normal work of breathing on room air MSK: She has slight reduction in range of motion  with extension.  She has visible increased tonicity of the thoracic spine on the right compared to the left with a small area of scoliosis to the right at approximately the T10-T12 area.  There is no midline tenderness palpation to the spine.  She is able to move her upper extremities in all planes without difficulty but she does have some pain  Assessment/ Plan: 28 y.o. female   Recurrent boils - Plan: clindamycin (CLEOCIN T) 1 % SWAB, Ambulatory referral to Dermatology, HSV 1 and 2 Ab, IgG, HSV 1 and 2 Ab, IgG  Hair thinning - Plan: Ambulatory referral to Dermatology  Acute bilateral thoracic back pain - Plan: methocarbamol (ROBAXIN) 500 MG tablet  I question if this is actually hidradenitis.  However, we will be glad to test her for HSV.  Referral to dermatology per her request placed as she also wants to discuss hair thinning with them  Trial of Robaxin.  Okay to continue Tylenol , ibuprofen .  Recommended chiropractic medicine should she find this to be helpful   Eliodoro Guerin, DO [facility] [phone]

## 2023-11-13 LAB — HSV 1 AND 2 AB, IGG
HSV 1 Glycoprotein G Ab, IgG: NONREACTIVE
HSV 2 IgG, Type Spec: NONREACTIVE

## 2023-11-15 ENCOUNTER — Encounter: Payer: Self-pay | Admitting: Family Medicine

## 2023-11-15 NOTE — Telephone Encounter (Signed)
 I have Patient scheduled and have LM this morning in regards to Appt information.

## 2023-11-23 ENCOUNTER — Encounter: Payer: Self-pay | Admitting: Family Medicine

## 2023-11-25 ENCOUNTER — Ambulatory Visit (HOSPITAL_COMMUNITY): Payer: MEDICAID | Admitting: Psychiatry

## 2023-11-28 ENCOUNTER — Other Ambulatory Visit (HOSPITAL_COMMUNITY): Payer: Self-pay | Admitting: Psychiatry

## 2023-11-28 DIAGNOSIS — F3341 Major depressive disorder, recurrent, in partial remission: Secondary | ICD-10-CM

## 2023-11-28 DIAGNOSIS — F4310 Post-traumatic stress disorder, unspecified: Secondary | ICD-10-CM

## 2023-11-28 DIAGNOSIS — F40240 Claustrophobia: Secondary | ICD-10-CM

## 2023-11-28 DIAGNOSIS — F410 Panic disorder [episodic paroxysmal anxiety] without agoraphobia: Secondary | ICD-10-CM

## 2023-11-28 DIAGNOSIS — F603 Borderline personality disorder: Secondary | ICD-10-CM

## 2023-11-29 ENCOUNTER — Encounter (HOSPITAL_COMMUNITY): Payer: Self-pay | Admitting: Psychiatry

## 2023-11-29 ENCOUNTER — Other Ambulatory Visit: Payer: Self-pay | Admitting: Family Medicine

## 2023-11-29 ENCOUNTER — Telehealth (INDEPENDENT_AMBULATORY_CARE_PROVIDER_SITE_OTHER): Payer: MEDICAID | Admitting: Psychiatry

## 2023-11-29 DIAGNOSIS — F331 Major depressive disorder, recurrent, moderate: Secondary | ICD-10-CM | POA: Diagnosis not present

## 2023-11-29 DIAGNOSIS — F603 Borderline personality disorder: Secondary | ICD-10-CM

## 2023-11-29 DIAGNOSIS — F4310 Post-traumatic stress disorder, unspecified: Secondary | ICD-10-CM

## 2023-11-29 DIAGNOSIS — F3341 Major depressive disorder, recurrent, in partial remission: Secondary | ICD-10-CM

## 2023-11-29 DIAGNOSIS — K219 Gastro-esophageal reflux disease without esophagitis: Secondary | ICD-10-CM

## 2023-11-29 DIAGNOSIS — F410 Panic disorder [episodic paroxysmal anxiety] without agoraphobia: Secondary | ICD-10-CM | POA: Diagnosis not present

## 2023-11-29 DIAGNOSIS — E559 Vitamin D deficiency, unspecified: Secondary | ICD-10-CM

## 2023-11-29 DIAGNOSIS — F411 Generalized anxiety disorder: Secondary | ICD-10-CM

## 2023-11-29 DIAGNOSIS — K449 Diaphragmatic hernia without obstruction or gangrene: Secondary | ICD-10-CM

## 2023-11-29 DIAGNOSIS — F5104 Psychophysiologic insomnia: Secondary | ICD-10-CM

## 2023-11-29 DIAGNOSIS — F40240 Claustrophobia: Secondary | ICD-10-CM

## 2023-11-29 MED ORDER — LAMOTRIGINE 100 MG PO TABS: 100.0000 mg | ORAL_TABLET | Freq: Every day | ORAL | 5 refills | Status: DC

## 2023-11-29 MED ORDER — CITALOPRAM HYDROBROMIDE 40 MG PO TABS: 40.0000 mg | ORAL_TABLET | Freq: Every day | ORAL | 1 refills | Status: DC

## 2023-11-29 MED ORDER — TRAZODONE HCL 50 MG PO TABS: 50.0000 mg | ORAL_TABLET | Freq: Every day | ORAL | 5 refills | Status: DC

## 2023-11-29 NOTE — Patient Instructions (Signed)
 We started trazodone 50 mg nightly today.
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
## 2023-12-01 NOTE — Telephone Encounter (Signed)
 Pt called back and asking if Dr. Britta Candy can assist with her requests to restart her medications since Dr. Bonnell Butcher will be OOO until next week. Please follow up with pt.

## 2023-12-02 ENCOUNTER — Telehealth: Admitting: Physician Assistant

## 2023-12-02 DIAGNOSIS — J020 Streptococcal pharyngitis: Secondary | ICD-10-CM | POA: Diagnosis not present

## 2023-12-02 MED ORDER — AZITHROMYCIN 250 MG PO TABS: ORAL_TABLET | ORAL | 0 refills | Status: AC

## 2023-12-02 NOTE — Progress Notes (Signed)
 I have spent 5 minutes in review of e-visit questionnaire, review and updating patient chart, medical decision making and response to patient.   Piedad Climes, PA-C

## 2023-12-02 NOTE — Progress Notes (Signed)
 E-Visit for Sore Throat - Strep Symptoms  We are sorry that you are not feeling well.  Here is how we plan to help!  Based on what you have shared with me it is likely that you have strep pharyngitis.  Strep pharyngitis is inflammation and infection in the back of the throat.  This is an infection cause by bacteria and is treated with antibiotics.  I have prescribed Azithromycin 250 mg two tablets today and then one daily for 4 additional days. For throat pain, we recommend over the counter oral pain relief medications such as acetaminophen or aspirin, or anti-inflammatory medications such as ibuprofen or naproxen sodium. Topical treatments such as oral throat lozenges or sprays may be used as needed. Strep infections are not as easily transmitted as other respiratory infections, however we still recommend that you avoid close contact with loved ones, especially the very young and elderly.  Remember to wash your hands thoroughly throughout the day as this is the number one way to prevent the spread of infection and wipe down door knobs and counters with disinfectant.   Home Care: Only take medications as instructed by your medical team. Complete the entire course of an antibiotic. Do not take these medications with alcohol. A steam or ultrasonic humidifier can help congestion.  You can place a towel over your head and breathe in the steam from hot water coming from a faucet. Avoid close contacts especially the very young and the elderly. Cover your mouth when you cough or sneeze. Always remember to wash your hands.  Get Help Right Away If: You develop worsening fever or sinus pain. You develop a severe head ache or visual changes. Your symptoms persist after you have completed your treatment plan.  Make sure you Understand these instructions. Will watch your condition. Will get help right away if you are not doing well or get worse.   Thank you for choosing an e-visit.  Your e-visit  answers were reviewed by a board certified advanced clinical practitioner to complete your personal care plan. Depending upon the condition, your plan could have included both over the counter or prescription medications.  Please review your pharmacy choice. Make sure the pharmacy is open so you can pick up prescription now. If there is a problem, you may contact your provider through [employer] and have the prescription routed to another pharmacy.  Your safety is important to [place]. If you have drug allergies check your prescription carefully.   For the next 24 hours you can use MyChart to ask questions about today's visit, request a non-urgent call back, or ask for a work or school excuse. You will get an email in the next two days asking about your experience. I hope that your e-visit has been valuable and will speed your recovery.

## 2023-12-06 ENCOUNTER — Other Ambulatory Visit: Payer: Self-pay | Admitting: Family Medicine

## 2023-12-06 DIAGNOSIS — F332 Major depressive disorder, recurrent severe without psychotic features: Secondary | ICD-10-CM

## 2023-12-06 DIAGNOSIS — E782 Mixed hyperlipidemia: Secondary | ICD-10-CM

## 2023-12-06 DIAGNOSIS — K219 Gastro-esophageal reflux disease without esophagitis: Secondary | ICD-10-CM

## 2023-12-06 DIAGNOSIS — K449 Diaphragmatic hernia without obstruction or gangrene: Secondary | ICD-10-CM

## 2023-12-06 MED ORDER — SEMAGLUTIDE-WEIGHT MANAGEMENT 0.5 MG/0.5ML ~~LOC~~ SOAJ: 0.5000 mg | SUBCUTANEOUS | 0 refills | Status: AC

## 2023-12-06 MED ORDER — SEMAGLUTIDE-WEIGHT MANAGEMENT 1.7 MG/0.75ML ~~LOC~~ SOAJ: 1.7000 mg | SUBCUTANEOUS | 12 refills | Status: DC

## 2023-12-06 MED ORDER — SEMAGLUTIDE-WEIGHT MANAGEMENT 1 MG/0.5ML ~~LOC~~ SOAJ: 1.0000 mg | SUBCUTANEOUS | 0 refills | Status: AC

## 2023-12-06 MED ORDER — SEMAGLUTIDE-WEIGHT MANAGEMENT 0.25 MG/0.5ML ~~LOC~~ SOAJ: 0.2500 mg | SUBCUTANEOUS | 0 refills | Status: AC

## 2023-12-06 MED ORDER — SEMAGLUTIDE-WEIGHT MANAGEMENT 2.4 MG/0.75ML ~~LOC~~ SOAJ: 2.4000 mg | SUBCUTANEOUS | 12 refills | Status: AC

## 2023-12-07 ENCOUNTER — Ambulatory Visit: Admitting: Professional Counselor

## 2023-12-07 ENCOUNTER — Institutional Professional Consult (permissible substitution): Admitting: Professional Counselor

## 2023-12-07 DIAGNOSIS — F332 Major depressive disorder, recurrent severe without psychotic features: Secondary | ICD-10-CM

## 2023-12-07 NOTE — BH Specialist Note (Signed)
 Collaborative Care Initial Assessment  Session Start time: 1:30 pm   Session End time: 2:30 pm  Total time in minutes: 60 min   Type of Contact:  Face to Face Patient consent obtained:  No Types of Service: Collaborative care  Summary  Patient is a 28 yo female being referred to collaborative care by her pcp for anxiety and depression. Patient was engaged and cooperative during session.   Reason for referral in patient/family's own words:  "Depression and anxiety"  Patient's goal for today's visit: "Medication management"  History of Present illness:   The patient is a 28 year old female who presented virtually for a collaborative care assessment focused on depression and anxiety. She joined the session from her home and reported a history of both conditions, for which she is currently being treated by Dr. Cathyann Cobia. However, her psychiatrist is leaving the practice, and she is now seeking support to continue medication management. She stated she is satisfied with her current dose of Celexa  and feels it has been beneficial overall. Her symptoms initially worsened about a year ago following the birth of her son, and she was diagnosed with postpartum depression at that time. She participated in an outpatient program, which she found helpful, and noted a general improvement in her mood afterward.  The patient shared that she had previously discontinued Celexa  due to side effects, but experienced a significant return of anxiety and depressive symptoms, prompting her decision to restart the medication. Current concerns include irritability, frequent frustration, difficulty concentrating, and trouble with both falling and staying asleep. She also expressed concerns about unmanaged ADD, stating she has tried multiple medications without success. She denies any psychiatric hospitalizations, suicide attempts, or current suicidal ideation. There is no reported history of trauma, alcohol use, or  substance abuse, though she did note experiencing emotional distress in childhood, primarily related to her mother. A psychiatric consultation will be scheduled to determine the most appropriate plan of care moving forward.  Clinical Assessment   PHQ-9 Assessments:    12/07/2023    1:49 PM 11/12/2023    1:34 PM 09/20/2023    1:38 PM 07/12/2023    3:15 PM 04/13/2023    1:42 PM  Depression screen PHQ 2/9  Decreased Interest 0 0 0 1 0  Down, Depressed, Hopeless 0 0 0 1 0  PHQ - 2 Score 0 0 0 2 0  Altered sleeping 3 1 2 1 1   Tired, decreased energy 3 0 1 1 1   Change in appetite 1 3 0 0 1  Feeling bad or failure about yourself  3 0 0 0 3  Trouble concentrating 3 0 0 0 1  Moving slowly or fidgety/restless 0 3 0 0 0  Suicidal thoughts 0 0 0 0 0  PHQ-9 Score 13 7 3 4 7   Difficult doing work/chores  Somewhat difficult Somewhat difficult  Somewhat difficult    GAD-7 Assessments:    12/07/2023    1:52 PM 11/12/2023    1:33 PM 09/20/2023    1:39 PM 07/12/2023    3:14 PM  GAD 7 : Generalized Anxiety Score  Nervous, Anxious, on Edge 1 0 1 0  Control/stop worrying 1 0 0 1  Worry too much - different things 1 0 0 0  Trouble relaxing 1 0 1 0  Restless 0 0 0 0  Easily annoyed or irritable 1 0 2 0  Afraid - awful might happen 0 0 0 0  Total GAD 7 Score 5 0 4 1  Anxiety Difficulty Somewhat difficult  Somewhat difficult Somewhat difficult     Social History:  Household: Husband and 48 year old Marital status: Married Number of Children: 1 Employment: Denies Education: some college  Psychiatric Review of systems: Insomnia: Yes Changes in appetite: Yes started wagobi Decreased need for sleep: No Family history of bipolar disorder: No Hallucinations: No   Paranoia: No    Psychotropic medications: Current medications: Cylexa Patient taking medications as prescribed: Yes Side effects reported: No  Current medications (medication list) Current Outpatient Medications on File Prior to  Visit  Medication Sig Dispense Refill   azithromycin  (ZITHROMAX ) 250 MG tablet Take 2 tablets on day 1, then 1 tablet daily on days 2 through 5 6 tablet 0   calcium carbonate (TUMS - DOSED IN MG ELEMENTAL CALCIUM) 500 MG chewable tablet Chew 2-3 tablets by mouth daily as needed for heartburn.     citalopram  (CELEXA ) 40 MG tablet Take 1 tablet (40 mg total) by mouth daily. 90 tablet 1   clindamycin  (CLEOCIN  T) 1 % SWAB Apply to affected areas up to twice daily for boils 60 each 12   famotidine  (PEPCID ) 20 MG tablet Take 20 mg by mouth daily.     Ferrous Sulfate (IRON PO) Take 1 tablet by mouth daily.     hydrOXYzine  (ATARAX ) 25 MG tablet Take 1 tablet (25 mg total) by mouth 2 (two) times daily as needed for anxiety (panic). 30 tablet 0   lamoTRIgine  (LAMICTAL ) 100 MG tablet Take 1 tablet (100 mg total) by mouth daily. 30 tablet 5   methocarbamol  (ROBAXIN ) 500 MG tablet Take 1 tablet (500 mg total) by mouth every 8 (eight) hours as needed for muscle spasms. 30 tablet 0   Multiple Vitamin (MULTIVITAMIN WITH MINERALS) TABS tablet Take 1 tablet by mouth daily.     naproxen sodium (ALEVE) 220 MG tablet Take 220 mg by mouth 2 (two) times daily as needed (pain).     ondansetron  (ZOFRAN ) 4 MG tablet Take 1 tablet (4 mg total) by mouth every 8 (eight) hours as needed. 30 tablet 1   pantoprazole  (PROTONIX ) 40 MG tablet Take 1 tablet (40 mg total) by mouth daily as needed. (Patient taking differently: Take 40 mg by mouth 2 (two) times daily.) 90 tablet 3   Semaglutide -Weight Management 0.25 MG/0.5ML SOAJ Inject 0.25 mg into the skin once a week for 28 days. 2 mL 0   [START ON 01/04/2024] Semaglutide -Weight Management 0.5 MG/0.5ML SOAJ Inject 0.5 mg into the skin once a week for 28 days. 2 mL 0   [START ON 02/02/2024] Semaglutide -Weight Management 1 MG/0.5ML SOAJ Inject 1 mg into the skin once a week for 28 days. 2 mL 0   [START ON 03/02/2024] Semaglutide -Weight Management 1.7 MG/0.75ML SOAJ Inject 1.7 mg into the  skin once a week. 3 mL 12   [START ON 03/31/2024] Semaglutide -Weight Management 2.4 MG/0.75ML SOAJ Inject 2.4 mg into the skin once a week. 3 mL 12   SYNTHROID  88 MCG tablet Take 1 tablet (88 mcg total) by mouth daily before breakfast. 90 tablet 3   traZODone  (DESYREL ) 50 MG tablet Take 1 tablet (50 mg total) by mouth at bedtime. 30 tablet 5   Vitamin D , Ergocalciferol , (DRISDOL ) 1.25 MG (50000 UNIT) CAPS capsule Take 1 capsule (50,000 Units total) by mouth every 7 (seven) days. 12 capsule 3   No current facility-administered medications on file prior to visit.    Psychiatric History: Past psychiatry diagnosis: GAD, MDD, ADD Patient currently being  seen by therapist/psychiatrist: Yes Prior Suicide Attempts:  no Past psychiatry Hospitalization(s): Outpatinet  Past history of violence: No  Traumatic Experiences: History or current traumatic events (natural disaster, house fire, etc.)? no History or current physical trauma?  no History or current emotional trauma?  yes History or current sexual trauma?  no History or current domestic or intimate partner violence?  no PTSD symptoms if any traumatic experiences no (Details: )  Alcohol and/or Substance Use History   Tobacco Alcohol Other substances  Current use     Past use     Past treatment      Withdrawal Potential:   Self-harm Behaviors Risk Assessment Self-harm risk factors:  Depression, anxiety grief Patient endorses recent thoughts of harming self: Denies [place] Suicide Severity Rating Scale:   Guns in the home: Secured in safe   Protective factors: Her son, husband,   Danger to Others Risk Assessment Danger to others risk factors:  Denies Patient endorses recent thoughts of harming others: Denies  Dynamic Appraisal of Situational Aggression (DASA):   BH Counselor discussed emergency crisis plan with client and provided local emergency services resources.  Mental status exam:   General Appearance Marcia Gonzalez:   Casual Eye Contact:  Good Motor Behavior:  Normal Speech:  Normal Level of Consciousness:  Alert Mood:  Depressed Affect:  Appropriate Anxiety Level:  None Thought Process:  Coherent Thought Content:  WNL Perception:  Normal Judgment:  Fair Insight:  Present  Diagnosis:   Goals: Increase healthy adjustment to current life circumstances   Interventions: CBT Cognitive Behavioral Therapy

## 2023-12-10 NOTE — Patient Instructions (Signed)
 If your symptoms worsen or you have thoughts of suicide/homicide, PLEASE SEEK IMMEDIATE MEDICAL ATTENTION.  You may always call:   National Suicide Hotline: 988 or (913)195-1115 [place] Crisis Line: 343-675-8477 Crisis Recovery in [place]: [phone]     These are available 24 hours a day, 7 days a week.

## 2023-12-17 ENCOUNTER — Ambulatory Visit

## 2023-12-17 ENCOUNTER — Telehealth (HOSPITAL_COMMUNITY): Payer: Self-pay

## 2023-12-17 NOTE — Telephone Encounter (Signed)
 Lvm to confirm 12/21/23 appt by 12pm 12/20/23

## 2023-12-21 ENCOUNTER — Encounter: Admitting: Professional Counselor

## 2023-12-21 ENCOUNTER — Ambulatory Visit (HOSPITAL_COMMUNITY): Payer: MEDICAID | Admitting: Psychiatry

## 2023-12-22 ENCOUNTER — Encounter: Payer: Self-pay | Admitting: Family Medicine

## 2023-12-27 ENCOUNTER — Other Ambulatory Visit: Payer: Self-pay | Admitting: Family Medicine

## 2023-12-27 ENCOUNTER — Encounter: Payer: Self-pay | Admitting: Family Medicine

## 2023-12-27 DIAGNOSIS — M546 Pain in thoracic spine: Secondary | ICD-10-CM

## 2023-12-27 MED ORDER — METHOCARBAMOL 500 MG PO TABS: 500.0000 mg | ORAL_TABLET | Freq: Three times a day (TID) | ORAL | 0 refills | Status: DC | PRN

## 2023-12-29 ENCOUNTER — Telehealth (INDEPENDENT_AMBULATORY_CARE_PROVIDER_SITE_OTHER): Payer: Self-pay | Admitting: Professional Counselor

## 2023-12-29 DIAGNOSIS — F332 Major depressive disorder, recurrent severe without psychotic features: Secondary | ICD-10-CM

## 2023-12-29 NOTE — BH Specialist Note (Signed)
 Virtual Behavioral Health Treatment Plan Team Note  MRN: [id number] NAME: Marcia Gonzalez  DATE: 12/29/23  Start time: Start Time: 1021 End time: Stop Time: 1026 Total time: Total Time in Minutes (Visit): 5  Total number of Virtual BH Treatment Team Plan encounters: 1/4  Treatment Team Attendees: Dr. Matias Gonzalez and Marcia Gonzalez  Attestation signed by Marcia Grills, MD at 12/29/2023 10:30 AM   Collaborative Care Psychiatric Consultant Case Review    Assessment/Provisional Diagnosis Marcia Gonzalez is a 28 y.o. year old female with history of Borderline personalith, Bipolar 1, Multiple suicide attempts, Morbid obesity. The patient is referred for medication management.  Patient has extesnive severe psychiatric illness and currently on high risk medications (lamictal  and Celexa  high doses) and requires higher level of care.  # Borderline personality # Bipolar disorder by hx # PTSD by hx   Recommendation Referral to Refer back to her psychiatrist. Recommend she sees a new psychiatrist after her psychiatrist retire. Not appropriate for collaborative care.  Recommend Referral for DBT as previously suggested by her psychiatrist as well.  BH specialist to follow up.     Thank you for your consult. Please contact our collaborative care team for any questions or concerns.    I spent 20 minutes chart reviewing, discussing with Mr. Marcia Gonzalez and documenting in the chart.    The above treatment considerations and suggestions are based on consultation with the [facility] specialist and/or PCP and a review of information available in the shared registry and the patient's Electronic Health Record (EHR). I have not personally examined the patient. All recommendations should be implemented with consideration of the patient's relevant prior history and current clinical status. Please feel free to call me with any questions about the care of this patient.    Diagnoses:    ICD-10-CM   1. 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 She joined the session from her home and reported a history of both conditions, for which she is currently being treated by Marcia Gonzalez. However, her psychiatrist is leaving the practice, and she is now seeking support to continue medication management. She stated she is satisfied with her current dose of Celexa  and feels it has been beneficial overall. Her symptoms initially worsened about a year ago following the birth of her son, and she was diagnosed with postpartum depression at that time. She participated in an outpatient program, which she found helpful, and noted a general improvement in her mood afterward.   The patient shared that she had previously discontinued Celexa  due to side effects, but experienced a significant return of anxiety and depressive symptoms, prompting her decision to restart the medication. Current concerns include irritability, frequent frustration, difficulty concentrating, and trouble with both falling and staying asleep. She also expressed concerns about unmanaged ADD, stating she has tried multiple medications without success. She denies any psychiatric hospitalizations, suicide attempts, or current suicidal ideation. There is no reported history of trauma, alcohol use, or substance abuse, though she did note experiencing emotional distress in childhood, primarily related to her mother. A psychiatric consultation will be scheduled to determine the most  appropriate plan of care  moving forward.   Screenings PHQ-9 Assessments:     12/07/2023    1:49 PM 11/12/2023    1:34 PM 09/20/2023    1:38 PM  Depression screen PHQ 2/9  Decreased Interest 0 0 0  Down, Depressed, Hopeless 0 0 0  PHQ - 2 Score 0 0 0  Altered sleeping 3 1 2   Tired, decreased energy 3 0 1  Change in appetite 1 3 0  Feeling bad or failure about yourself  3 0 0  Trouble concentrating 3 0 0  Moving slowly or fidgety/restless 0 3 0  Suicidal thoughts 0 0 0  PHQ-9 Score 13 7 3   Difficult doing work/chores  Somewhat difficult Somewhat difficult   GAD-7 Assessments:     12/07/2023    1:52 PM 11/12/2023    1:33 PM 09/20/2023    1:39 PM 07/12/2023    3:14 PM  GAD 7 : Generalized Anxiety Score  Nervous, Anxious, on Edge 1 0 1 0  Control/stop worrying 1 0 0 1  Worry too much - different things 1 0 0 0  Trouble relaxing 1 0 1 0  Restless 0 0 0 0  Easily annoyed or irritable 1 0 2 0  Afraid - awful might happen 0 0 0 0  Total GAD 7 Score 5 0 4 1  Anxiety Difficulty Somewhat difficult  Somewhat difficult Somewhat difficult    Past Medical History Past Medical History:  Diagnosis Date   ADHD (attention deficit hyperactivity disorder)    Akathisia 10/05/2022   Allergy    Anemia    Anxiety    Arthritis    Asthma    Binge-eating disorder, in partial remission, moderate 08/07/2022   Chronic hypertension affecting pregnancy 12/03/2021   Dx 12/03/2021 (by today's BP + ^home values); rx Labetalol  200mg  bid; growth q 4wks; 2x/wk testing @ 32wks; IOL 37-39wk   Constipation 08/14/2022   Depression    Encounter for supervision of high risk pregnancy in second trimester, antepartum 10/21/2021         FAMILY TREE     RESULTS  Language  English  Pap  (needs postpartum)  Initiated care at  13wks  GC/CT  Initial:  -/-          36wks:  Dating by  6wk US         Support person     Genetics  NT/IT: neg/neg    AFP:      Panorama: low risk female  BP cuff  rx'd  Carrier Screen  Neg 05/01/21        [place]/Hgb Elec      Rhogam  n/a        TDaP vaccine  02/11/22  Blood Type  O/Positive/-- (04/04 1619)  Flu v   Endometriosis    GERD (gastroesophageal reflux disease)    Hiatal hernia    History of borderline personality disorder    Hypertension    gestational   Hypothyroidism    IIH (idiopathic intracranial hypertension)    Insomnia 10/23/2022   Missed periods 09/22/2022   Polypharmacy 10/13/2022   Pregnancy examination or test, negative result 09/22/2022   Pregnancy induced hypertension    PTSD (post-traumatic stress disorder)    Suicidal ideation 08/07/2022   UTI (urinary tract infection)    Vaginal delivery 04/05/2022   Vitamin D  deficiency 09/08/2022   Wells' syndrome     Vital signs: There were no vitals filed for this visit.  Allergies:  Allergies as of 12/29/2023 - Review Complete 12/02/2023  Allergen Reaction Noted   Codeine Nausea Only 12/28/2020   Percocet [oxycodone -acetaminophen ] Hives and Itching 08/31/2021   Amoxicillin-pot clavulanate Other (See Comments) 12/28/2020   Latex Other (See Comments) 09/04/2022   Misoprostol   04/02/2022   Pollen extract Other (See Comments) 07/29/2022   Tape Rash 07/30/2023    Medication History Current medications:  Outpatient Encounter Medications as of 12/29/2023  Medication Sig   calcium carbonate (TUMS - DOSED IN MG ELEMENTAL CALCIUM) 500 MG chewable tablet Chew 2-3 tablets by mouth daily as needed for heartburn.   citalopram  (CELEXA ) 40 MG tablet Take 1 tablet (40 mg total) by mouth daily.   clindamycin  (CLEOCIN  T) 1 % SWAB Apply to affected areas up to twice daily for boils   famotidine  (PEPCID ) 20 MG tablet Take 20 mg by mouth daily.   Ferrous Sulfate (IRON PO) Take 1 tablet by mouth daily.   hydrOXYzine  (ATARAX ) 25 MG tablet Take 1 tablet (25 mg total) by mouth 2 (two) times daily as needed for anxiety (panic).   lamoTRIgine  (LAMICTAL ) 100 MG tablet Take 1 tablet (100 mg total) by mouth daily.   methocarbamol  (ROBAXIN ) 500 MG tablet Take 1  tablet (500 mg total) by mouth every 8 (eight) hours as needed for muscle spasms.   Multiple Vitamin (MULTIVITAMIN WITH MINERALS) TABS tablet Take 1 tablet by mouth daily.   naproxen sodium (ALEVE) 220 MG tablet Take 220 mg by mouth 2 (two) times daily as needed (pain).   ondansetron  (ZOFRAN ) 4 MG tablet Take 1 tablet (4 mg total) by mouth every 8 (eight) hours as needed.   pantoprazole  (PROTONIX ) 40 MG tablet Take 1 tablet (40 mg total) by mouth daily as needed. (Patient taking differently: Take 40 mg by mouth 2 (two) times daily.)   Semaglutide -Weight Management 0.25 MG/0.5ML SOAJ Inject 0.25 mg into the skin once a week for 28 days.   [START ON 01/04/2024] Semaglutide -Weight Management 0.5 MG/0.5ML SOAJ Inject 0.5 mg into the skin once a week for 28 days.   [START ON 02/02/2024] Semaglutide -Weight Management 1 MG/0.5ML SOAJ Inject 1 mg into the skin once a week for 28 days.   [START ON 03/02/2024] Semaglutide -Weight Management 1.7 MG/0.75ML SOAJ Inject 1.7 mg into the skin once a week.   [START ON 03/31/2024] Semaglutide -Weight Management 2.4 MG/0.75ML SOAJ Inject 2.4 mg into the skin once a week.   SYNTHROID  88 MCG tablet Take 1 tablet (88 mcg total) by mouth daily before breakfast.   traZODone  (DESYREL ) 50 MG tablet Take 1 tablet (50 mg total) by mouth at bedtime.   Vitamin D , Ergocalciferol , (DRISDOL ) 1.25 MG (50000 UNIT) CAPS capsule Take 1 capsule (50,000 Units total) by mouth every 7 (seven) days.   No facility-administered encounter medications on file as of 12/29/2023.     Scribe for Treatment Team: Regina Capes

## 2024-01-19 ENCOUNTER — Ambulatory Visit: Payer: Self-pay

## 2024-01-19 NOTE — Telephone Encounter (Signed)
 Attempt X1 to call patient back regarding medication concerns.   No response noted.  Left discrete VM appropriately

## 2024-01-19 NOTE — Telephone Encounter (Signed)
 FYI Only or Action Required?: Action required by provider: medication refill request.  Patient was last seen in primary care on 11/12/2023 by [name], DO. Called Nurse Triage reporting Medication Refill. Symptoms began na. Interventions attempted: Other: na. Symptoms are: na.  Triage Disposition: Call PCP When Office is Open  Patient/caregiver understands and will follow disposition?:   Reason for Disposition  [1] Prescription refill request for NON-ESSENTIAL medicine (i.e., no harm to patient if med not taken) AND [2] triager unable to refill per department policy  Answer Assessment - Initial Assessment Questions 1. DRUG NAME: What medicine do you need to have refilled?     Semaglutide  1mg .  2. REFILLS REMAINING: How many refills are remaining? (Note: The label on the medicine or pill bottle will show how many refills are remaining. If there are no refills remaining, then a renewal may be needed.)     Next fill date is 02/02/24  Additional info: Leaving for trip but unsure of date, she is donating bone marrow and on a call list. Requesting pick up July semaglutide  early, states she will keep it in the fridge until date it is due.  Please resend script for sooner date per patient request. Please follow up via phone if unable to send early  Protocols used: Medication Refill and Renewal Call-A-AH

## 2024-01-19 NOTE — Telephone Encounter (Signed)
 Copied from CRM [id number]. Topic: Clinical - Prescription Issue >> Jan 19, 2024  2:47 PM Marcia Gonzalez wrote: Reason for CRM: Semaglutide -Weight Management 1 MG/0.5ML Marcia Gonzalez [[id number]]  I informed her the refill is set for 7/17 however the pharamcy is telling her there is no refills on this dose.  The Pa was already approved for her to take the medication, patient would like a  callback [phone] (M)

## 2024-01-27 ENCOUNTER — Other Ambulatory Visit (HOSPITAL_COMMUNITY): Payer: Self-pay

## 2024-01-27 ENCOUNTER — Telehealth: Payer: Self-pay | Admitting: Pharmacy Technician

## 2024-01-27 NOTE — Telephone Encounter (Signed)
Noted  -LS

## 2024-01-27 NOTE — Telephone Encounter (Signed)
 Pt called back. Enrolled with [employer]. They said it would take 48 hours to update with [facility]. Will try to submit PA again Monday.

## 2024-01-27 NOTE — Telephone Encounter (Signed)
 Pharmacy Patient Advocate Encounter   Received notification from Pt Calls Messages that prior authorization for Wegovy  1mg  is required/requested.   Insurance verification completed.   The patient is insured through [employer] .   Per test claim: PA required and submitted KEY/EOC/Request #: [id number] due to pt needing to enroll in virtual care program through [facility].  Called to inform the pt and let her know she needs to contact the office back once she's enrolled with [employer] and then we can submit the PA request.

## 2024-01-31 ENCOUNTER — Other Ambulatory Visit (HOSPITAL_COMMUNITY): Payer: Self-pay

## 2024-01-31 NOTE — Telephone Encounter (Signed)
 Pharmacy Patient Advocate Encounter   Received notification from Patient Pharmacy that prior authorization for Wegovy  is required/requested.   Insurance verification completed.   The patient is insured through [employer] .   Per test claim: PA required; PA submitted to above mentioned insurance via CoverMyMeds Key/confirmation #/EOC [id number] Status is pending

## 2024-02-02 ENCOUNTER — Encounter: Payer: Self-pay | Admitting: Family Medicine

## 2024-02-02 ENCOUNTER — Ambulatory Visit: Payer: Commercial Managed Care - PPO | Admitting: Family Medicine

## 2024-02-02 VITALS — BP 103/71 | HR 67 | Temp 98.3°F | Ht 65.0 in | Wt 212.0 lb

## 2024-02-02 DIAGNOSIS — F411 Generalized anxiety disorder: Secondary | ICD-10-CM

## 2024-02-02 DIAGNOSIS — Z0001 Encounter for general adult medical examination with abnormal findings: Secondary | ICD-10-CM | POA: Diagnosis not present

## 2024-02-02 DIAGNOSIS — F900 Attention-deficit hyperactivity disorder, predominantly inattentive type: Secondary | ICD-10-CM | POA: Diagnosis not present

## 2024-02-02 DIAGNOSIS — E782 Mixed hyperlipidemia: Secondary | ICD-10-CM

## 2024-02-02 DIAGNOSIS — K219 Gastro-esophageal reflux disease without esophagitis: Secondary | ICD-10-CM

## 2024-02-02 DIAGNOSIS — E559 Vitamin D deficiency, unspecified: Secondary | ICD-10-CM

## 2024-02-02 DIAGNOSIS — Z0000 Encounter for general adult medical examination without abnormal findings: Secondary | ICD-10-CM

## 2024-02-02 DIAGNOSIS — E063 Autoimmune thyroiditis: Secondary | ICD-10-CM

## 2024-02-02 DIAGNOSIS — N3000 Acute cystitis without hematuria: Secondary | ICD-10-CM | POA: Diagnosis not present

## 2024-02-02 DIAGNOSIS — F5104 Psychophysiologic insomnia: Secondary | ICD-10-CM

## 2024-02-02 DIAGNOSIS — N939 Abnormal uterine and vaginal bleeding, unspecified: Secondary | ICD-10-CM

## 2024-02-02 DIAGNOSIS — F3341 Major depressive disorder, recurrent, in partial remission: Secondary | ICD-10-CM

## 2024-02-02 DIAGNOSIS — F603 Borderline personality disorder: Secondary | ICD-10-CM

## 2024-02-02 DIAGNOSIS — L0292 Furuncle, unspecified: Secondary | ICD-10-CM

## 2024-02-02 DIAGNOSIS — F410 Panic disorder [episodic paroxysmal anxiety] without agoraphobia: Secondary | ICD-10-CM

## 2024-02-02 LAB — MICROSCOPIC EXAMINATION
RBC, Urine: NONE SEEN /HPF (ref 0–2)
Renal Epithel, UA: NONE SEEN /HPF
Yeast, UA: NONE SEEN

## 2024-02-02 LAB — URINALYSIS, ROUTINE W REFLEX MICROSCOPIC
Bilirubin, UA: NEGATIVE
Glucose, UA: NEGATIVE
Nitrite, UA: NEGATIVE
Specific Gravity, UA: >1.03 — ABNORMAL HIGH (ref 1.005–1.030)
Urobilinogen, Ur: 0.2 mg/dL (ref 0.2–1.0)
pH, UA: 6 (ref 5.0–7.5)

## 2024-02-02 LAB — LIPID PANEL

## 2024-02-02 MED ORDER — NITROFURANTOIN MONOHYD MACRO 100 MG PO CAPS: 100.0000 mg | ORAL_CAPSULE | Freq: Two times a day (BID) | ORAL | 0 refills | Status: AC

## 2024-02-02 MED ORDER — AMPHETAMINE-DEXTROAMPHET ER 10 MG PO CP24: 10.0000 mg | ORAL_CAPSULE | Freq: Every day | ORAL | 0 refills | Status: DC

## 2024-02-02 MED ORDER — CLINDAMYCIN PHOSPHATE 1 % EX SWAB: CUTANEOUS | 12 refills | Status: DC

## 2024-02-02 MED ORDER — ONDANSETRON HCL 4 MG PO TABS: 4.0000 mg | ORAL_TABLET | Freq: Three times a day (TID) | ORAL | 1 refills | Status: AC | PRN

## 2024-02-02 MED ORDER — TRAZODONE HCL 50 MG PO TABS: 50.0000 mg | ORAL_TABLET | Freq: Every day | ORAL | 1 refills | Status: DC

## 2024-02-02 MED ORDER — FLUCONAZOLE 150 MG PO TABS
150.0000 mg | ORAL_TABLET | Freq: Once | ORAL | 0 refills | Status: AC
Start: 2024-02-02 — End: 2024-02-02

## 2024-02-02 MED ORDER — CITALOPRAM HYDROBROMIDE 40 MG PO TABS: 40.0000 mg | ORAL_TABLET | Freq: Every day | ORAL | 1 refills | Status: DC

## 2024-02-02 MED ORDER — LAMOTRIGINE 100 MG PO TABS: 100.0000 mg | ORAL_TABLET | Freq: Every day | ORAL | 1 refills | Status: DC

## 2024-02-02 MED ORDER — SYNTHROID 88 MCG PO TABS: 88.0000 ug | ORAL_TABLET | Freq: Every day | ORAL | 3 refills | Status: AC

## 2024-02-02 NOTE — Progress Notes (Signed)
 Marcia Gonzalez is a 28 y.o. female presents to office today for annual physical exam examination.    Concerns today include: 1.  ADHD associated with PTSD, major depressive disorder She is now under the care of a new therapist and will be establishing with a new psychiatrist at some point but it will be several months before she can get in with them.  They recommended that she talk to me about taking over her medications in the meantime and potentially starting her on something for ADHD.  She was previously on Vyvanse  but this was ineffective for her.  She is stable from a mental health standpoint on Celexa , Lamictal  and as needed trazodone .  No signs or symptoms of serotonin syndrome.  IUD in place for contraception  2.  Suspected UTI Reports some low back pain associated with nausea.  This is similar to when she had a UTI and was seen in the ER.  She denies any dysuria, hematuria, fevers, vomiting.  She reports pelvic pain that onset Friday p.m. she is having some abnormal uterine bleeding with her IUD and will be seeing her OB/GYN for this soon  Marital status: married, Substance use: none Health Maintenance Due  Topic Date Due   Pneumococcal Vaccine 30-43 Years old (1 of 2 - PCV) 04/16/2017   Refills needed today: all  Immunization History  Administered Date(s) Administered   HPV Quadrivalent 02/19/2011   Hpv-Unspecified 02/27/2010   Influenza, Seasonal, Injecte, Preservative Fre 04/13/2023   Influenza,inj,Quad PF,6+ Mos 05/18/2022   Pneumococcal Conjugate PCV 7 07/03/1999, 09/18/1999   Tdap 05/02/2021, 02/11/2022   Vaxelis (DTaP,IPV,Hib,HepB) 06/19/1998, 01/23/1999, 09/18/1999   Past Medical History:  Diagnosis Date   ADHD (attention deficit hyperactivity disorder)    Akathisia 10/05/2022   Allergy    Anemia    Anxiety    Arthritis    Asthma    Binge-eating disorder, in partial remission, moderate 08/07/2022   Chronic hypertension affecting pregnancy 12/03/2021   Dx  12/03/2021 (by today's BP + ^home values); rx Labetalol  200mg  bid; growth q 4wks; 2x/wk testing @ 32wks; IOL 37-39wk   Constipation 08/14/2022   Depression    Encounter for supervision of high risk pregnancy in second trimester, antepartum 10/21/2021         FAMILY TREE     RESULTS  Language  English  Pap  (needs postpartum)  Initiated care at  13wks  GC/CT  Initial:  -/-          36wks:  Dating by  6wk US         Support person     Genetics  NT/IT: neg/neg    AFP:      Panorama: low risk female  BP cuff  rx'd  Carrier Screen  Neg 05/01/21        Washtucna/Hgb Elec     Rhogam  n/a        TDaP vaccine  02/11/22  Blood Type  O/Positive/-- (04/04 1619)  Flu v   Endometriosis    GERD (gastroesophageal reflux disease)    Hiatal hernia    History of borderline personality disorder    Hypertension    gestational   Hypothyroidism    IIH (idiopathic intracranial hypertension)    Insomnia 10/23/2022   Missed periods 09/22/2022   Polypharmacy 10/13/2022   Pregnancy examination or test, negative result 09/22/2022   Pregnancy induced hypertension    PTSD (post-traumatic stress disorder)    Suicidal ideation 08/07/2022   UTI (urinary tract infection)  Vaginal delivery 04/05/2022   Vitamin D  deficiency 09/08/2022   Malvina' syndrome    Social History   Socioeconomic History   Marital status: Married    Spouse name: Inge   Number of children: 1   Years of education: 12   Highest education level: High school graduate  Occupational History   Not on file  Tobacco Use   Smoking status: Never    Passive exposure: Never   Smokeless tobacco: Never  Vaping Use   Vaping status: Never Used  Substance and Sexual Activity   Alcohol use: Not Currently   Drug use: Never   Sexual activity: Yes    Birth control/protection: None, Condom  Other Topics Concern   Not on file  Social History Narrative   Not on file   Social Drivers of Health   Financial Resource Strain: Low Risk  (04/13/2022)   Overall  Financial Resource Strain (CARDIA)    Difficulty of Paying Living Expenses: Not very hard  Food Insecurity: No Food Insecurity (04/13/2022)   Hunger Vital Sign    Worried About Running Out of Food in the Last Year: Never true    Ran Out of Food in the Last Year: Never true  Transportation Needs: No Transportation Needs (04/13/2022)   PRAPARE - Administrator, Civil Service (Medical): No    Lack of Transportation (Non-Medical): No  Physical Activity: Insufficiently Active (04/13/2022)   Exercise Vital Sign    Days of Exercise per Week: 1 day    Minutes of Exercise per Session: 10 min  Stress: No Stress Concern Present (04/13/2022)   [employer] of Occupational Health - Occupational Stress Questionnaire    Feeling of Stress : Only a little  Social Connections: Unknown (11/09/2022)   [facility] from [facility]   Social Network    Social Network: Not on file  Intimate Partner Violence: Not At Risk (07/08/2023)   Received from [facility]   HITS    Over the last 12 months how often did your partner physically hurt you?: Never    Over the last 12 months how often did your partner insult you or talk down to you?: Never    Over the last 12 months how often did your partner threaten you with physical harm?: Never    Over the last 12 months how often did your partner scream or curse at you?: Never   Past Surgical History:  Procedure Laterality Date   ABDOMINAL EXPLORATION SURGERY  04/2021   COLONOSCOPY     DILATION AND CURETTAGE OF UTERUS     ESOPHAGOGASTRODUODENOSCOPY     FINGER SURGERY Left    thumb   LAPAROTOMY     LUMBAR PUNCTURE     Family History  Problem Relation Age of Onset   COPD Maternal Grandmother    Arthritis Maternal Grandmother    Depression Father    Anxiety disorder Father    Kidney disease Mother    Hypertension Mother    Hyperlipidemia Mother    Depression Mother    Cancer Mother        cervical   Arthritis Mother    Anxiety disorder  Mother    Asthma Brother    Vesicoureteral reflux Son     Current Outpatient Medications:    calcium carbonate (TUMS - DOSED IN MG ELEMENTAL CALCIUM) 500 MG chewable tablet, Chew 2-3 tablets by mouth daily as needed for heartburn., Disp: , Rfl:    citalopram  (CELEXA ) 40 MG tablet, Take  1 tablet (40 mg total) by mouth daily., Disp: 90 tablet, Rfl: 1   clindamycin  (CLEOCIN  T) 1 % SWAB, Apply to affected areas up to twice daily for boils, Disp: 60 each, Rfl: 12   famotidine  (PEPCID ) 20 MG tablet, Take 20 mg by mouth daily., Disp: , Rfl:    Ferrous Sulfate (IRON PO), Take 1 tablet by mouth daily., Disp: , Rfl:    hydrOXYzine  (ATARAX ) 25 MG tablet, Take 1 tablet (25 mg total) by mouth 2 (two) times daily as needed for anxiety (panic)., Disp: 30 tablet, Rfl: 0   lamoTRIgine  (LAMICTAL ) 100 MG tablet, Take 1 tablet (100 mg total) by mouth daily., Disp: 30 tablet, Rfl: 5   methocarbamol  (ROBAXIN ) 500 MG tablet, Take 1 tablet (500 mg total) by mouth every 8 (eight) hours as needed for muscle spasms., Disp: 30 tablet, Rfl: 0   Multiple Vitamin (MULTIVITAMIN WITH MINERALS) TABS tablet, Take 1 tablet by mouth daily., Disp: , Rfl:    naproxen sodium (ALEVE) 220 MG tablet, Take 220 mg by mouth 2 (two) times daily as needed (pain)., Disp: , Rfl:    ondansetron  (ZOFRAN ) 4 MG tablet, Take 1 tablet (4 mg total) by mouth every 8 (eight) hours as needed., Disp: 30 tablet, Rfl: 1   pantoprazole  (PROTONIX ) 40 MG tablet, Take 1 tablet (40 mg total) by mouth daily as needed. (Patient taking differently: Take 40 mg by mouth 2 (two) times daily.), Disp: 90 tablet, Rfl: 3   Semaglutide -Weight Management 1 MG/0.5ML SOAJ, Inject 1 mg into the skin once a week for 28 days., Disp: 2 mL, Rfl: 0   [START ON 03/02/2024] Semaglutide -Weight Management 1.7 MG/0.75ML SOAJ, Inject 1.7 mg into the skin once a week., Disp: 3 mL, Rfl: 12   [START ON 03/31/2024] Semaglutide -Weight Management 2.4 MG/0.75ML SOAJ, Inject 2.4 mg into the skin  once a week., Disp: 3 mL, Rfl: 12   SYNTHROID  88 MCG tablet, Take 1 tablet (88 mcg total) by mouth daily before breakfast., Disp: 90 tablet, Rfl: 3   traZODone  (DESYREL ) 50 MG tablet, Take 1 tablet (50 mg total) by mouth at bedtime., Disp: 30 tablet, Rfl: 5   Vitamin D , Ergocalciferol , (DRISDOL ) 1.25 MG (50000 UNIT) CAPS capsule, Take 1 capsule (50,000 Units total) by mouth every 7 (seven) days., Disp: 12 capsule, Rfl: 3  Allergies  Allergen Reactions   Codeine Nausea Only    Codeine based cough syrups    Percocet [Oxycodone -Acetaminophen ] Hives and Itching    Pt tolerates oxycodone  and tylenol  separately    Amoxicillin-Pot Clavulanate Other (See Comments)    Hallucinations   Latex Other (See Comments)    Gets Blisters   Misoprostol      Left blisters in mouth   Pollen Extract Other (See Comments)   Tape Rash    Tolerates paper tape     ROS: Review of Systems Pertinent items noted in HPI and remainder of comprehensive ROS otherwise negative.    Physical exam BP 103/71   Pulse 67   Temp 98.3 F (36.8 C)   Ht 5' 5 (1.651 m)   Wt 212 lb (96.2 kg)   LMP 01/03/2024   SpO2 96%   BMI 35.28 kg/m  General appearance: alert, cooperative, appears stated age, no distress, and moderately obese Head: Normocephalic, without obvious abnormality, atraumatic Eyes: negative findings: lids and lashes normal, conjunctivae and sclerae normal, corneas clear, and pupils equal, round, reactive to light and accomodation Ears: normal TM's and external ear canals both ears Nose: Nares  normal. Septum midline. Mucosa normal. No drainage or sinus tenderness. Throat: lips, mucosa, and tongue normal; teeth and gums normal Neck: no adenopathy, supple, symmetrical, trachea midline, and thyroid  not enlarged, symmetric, no tenderness/mass/nodules Back: symmetric, no curvature. ROM normal. No CVA tenderness. Lungs: clear to auscultation bilaterally Heart: regular rate and rhythm, S1, S2 normal, no murmur,  click, rub or gallop Abdomen: Obese, soft.  She does have some mild epigastric tenderness palpation and some tenderness palpation in the left and right lower quadrants Extremities: extremities normal, atraumatic, no cyanosis or edema Pulses: 2+ and symmetric Skin: Several pigmented nevi Lymph nodes: Cervical, supraclavicular, and axillary nodes normal. Neurologic: Grossly normal      02/02/2024    2:20 PM 12/07/2023    1:49 PM 11/12/2023    1:34 PM  Depression screen PHQ 2/9  Decreased Interest 1 0 0  Down, Depressed, Hopeless 1 0 0  PHQ - 2 Score 2 0 0  Altered sleeping 0 3 1  Tired, decreased energy 3 3 0  Change in appetite 0 1 3  Feeling bad or failure about yourself  1 3 0  Trouble concentrating 3 3 0  Moving slowly or fidgety/restless 0 0 3  Suicidal thoughts 0 0 0  PHQ-9 Score 9 13 7   Difficult doing work/chores Very difficult  Somewhat difficult      02/02/2024    2:23 PM 12/07/2023    1:52 PM 11/12/2023    1:33 PM 09/20/2023    1:39 PM  GAD 7 : Generalized Anxiety Score  Nervous, Anxious, on Edge 1 1 0 1  Control/stop worrying 0 1 0 0  Worry too much - different things 0 1 0 0  Trouble relaxing 0 1 0 1  Restless 0 0 0 0  Easily annoyed or irritable 0 1 0 2  Afraid - awful might happen 0 0 0 0  Total GAD 7 Score 1 5 0 4  Anxiety Difficulty Somewhat difficult Somewhat difficult  Somewhat difficult     Assessment/ Plan: Lavanda Mealing here for annual physical exam.   Annual physical exam  Attention deficit hyperactivity disorder (ADHD), predominantly inattentive type - Plan: amphetamine -dextroamphetamine (ADDERALL XR) 10 MG 24 hr capsule, Drug Screen 10 W/Conf, Se  Abnormal uterine bleeding (AUB) - Plan: Iron, TIBC and Ferritin Panel  Acute cystitis without hematuria - Plan: Urinalysis, Routine w reflex microscopic, nitrofurantoin , macrocrystal-monohydrate, (MACROBID ) 100 MG capsule, fluconazole  (DIFLUCAN ) 150 MG tablet, Urine Culture  Hashimoto's thyroiditis -  Plan: CMP14+EGFR, TSH + free T4, SYNTHROID  88 MCG tablet  Mixed hyperlipidemia - Plan: CMP14+EGFR, Lipid Panel  Vitamin D  deficiency - Plan: VITAMIN D  25 Hydroxy (Vit-D Deficiency, Fractures)  Gastroesophageal reflux disease without esophagitis - Plan: CBC  Borderline personality disorder (HCC) - Plan: citalopram  (CELEXA ) 40 MG tablet, lamoTRIgine  (LAMICTAL ) 100 MG tablet  Generalized anxiety disorder with panic attacks - Plan: citalopram  (CELEXA ) 40 MG tablet  Recurrent major depressive disorder in partial remission (HCC) - Plan: citalopram  (CELEXA ) 40 MG tablet, lamoTRIgine  (LAMICTAL ) 100 MG tablet  Psychophysiologic insomnia - Plan: traZODone  (DESYREL ) 50 MG tablet  Recurrent boils - Plan: clindamycin  (CLEOCIN  T) 1 % SWAB  Will start ADHD medication at the the request of her psychiatrist as she cannot secure an appointment there for several months.  Previously failed Vyvanse .  Will trial low-dose Adderall.  We will complete drug screening, controlled substance contract.  The national narcotic database was reviewed and there were no red flags.  1 month follow-up recommended for recheck  Check thyroid  levels, liver enzymes.  Thyroid  medication renewed.  She is clinically euthymic  Fasting lipid panel collected, LFTs.  Doing a great job with weight loss with Wegovy .  Down from 247 pounds to 212 pounds today  GERD is stable with Voquenza.  Check CBC  I am glad to take over her medications as she is stable and continues to see therapy.  Celexa , Lamictal  and trazodone  renewed.  She understands signs and symptoms of serotonin syndrome given concomitant use of SSRI and trazodone  is demonstrating none of those signs  Keep appoint with dermatology.  Cleocin  swabs renewed for as needed use  Counseled on healthy lifestyle choices, including diet (rich in fruits, vegetables and lean meats and low in salt and simple carbohydrates) and exercise (at least 30 minutes of moderate physical activity  daily).  Patient to follow up 1 mth  Marland Reine M. Jolinda, DO

## 2024-02-03 ENCOUNTER — Telehealth: Payer: Self-pay | Admitting: Family Medicine

## 2024-02-03 ENCOUNTER — Ambulatory Visit: Payer: Self-pay | Admitting: Family Medicine

## 2024-02-03 LAB — URINE CULTURE

## 2024-02-03 NOTE — Telephone Encounter (Signed)
 Yes ok to provide note

## 2024-02-03 NOTE — Telephone Encounter (Unsigned)
 Copied from CRM [id number]. Topic: Clinical - Medication Prior Auth >> Feb 03, 2024  2:15 PM Elle L wrote: Reason for CRM: The patient needs a prior authorization for her Semaglutide -Weight Management 1 MG/0.5ML SOAJ.   Pharmacy:  [facility] [address], [place] - [address], [place] [address] Phone: [phone]  Fax: [phone]

## 2024-02-03 NOTE — Telephone Encounter (Signed)
 Letter sent.

## 2024-02-03 NOTE — Telephone Encounter (Signed)
 Copied from CRM [id number]. Topic: General - Other >> Feb 03, 2024  9:07 AM Edsel HERO wrote: Patient's husband, Inge called to see if Dr. Jolinda would write an out of work note for yesterday. Inge states he missed work because he needed to take patient to appointment. [email or web address] Please advise.

## 2024-02-04 LAB — LIPID PANEL
Chol/HDL Ratio: 3.6 ratio (ref 0.0–4.4)
Cholesterol, Total: 158 mg/dL (ref 100–199)
HDL: 44 mg/dL (ref >39)
LDL Chol Calc (NIH): 101 mg/dL — ABNORMAL HIGH (ref 0–99)
Triglycerides: 65 mg/dL (ref 0–149)
VLDL Cholesterol Cal: 13 mg/dL (ref 5–40)

## 2024-02-04 LAB — CMP14+EGFR
ALT: 8 IU/L (ref 0–32)
AST: 15 IU/L (ref 0–40)
Albumin: 4.6 g/dL (ref 4.0–5.0)
Alkaline Phosphatase: 56 IU/L (ref 44–121)
BUN/Creatinine Ratio: 10 (ref 9–23)
BUN: 9 mg/dL (ref 6–20)
Bilirubin Total: 0.5 mg/dL (ref 0.0–1.2)
CO2: 21 mmol/L (ref 20–29)
Calcium: 9.2 mg/dL (ref 8.7–10.2)
Chloride: 103 mmol/L (ref 96–106)
Creatinine, Ser: 0.86 mg/dL (ref 0.57–1.00)
Globulin, Total: 2.3 g/dL (ref 1.5–4.5)
Glucose: 85 mg/dL (ref 70–99)
Potassium: 4.2 mmol/L (ref 3.5–5.2)
Sodium: 138 mmol/L (ref 134–144)
Total Protein: 6.9 g/dL (ref 6.0–8.5)
eGFR: 96 mL/min/1.73 (ref >59)

## 2024-02-04 LAB — DRUG SCREEN 10 W/CONF, SERUM
Amphetamines, IA: NEGATIVE ng/mL
Barbiturates, IA: NEGATIVE ug/mL
Benzodiazepines, IA: NEGATIVE ng/mL
Cocaine & Metabolite, IA: NEGATIVE ng/mL
Methadone, IA: NEGATIVE ng/mL
Opiates, IA: NEGATIVE ng/mL
Oxycodones, IA: NEGATIVE ng/mL
Phencyclidine, IA: NEGATIVE ng/mL
Propoxyphene, IA: NEGATIVE ng/mL
THC(Marijuana) Metabolite, IA: NEGATIVE ng/mL

## 2024-02-04 LAB — CBC
Hematocrit: 38.4 % (ref 34.0–46.6)
Hemoglobin: 12.2 g/dL (ref 11.1–15.9)
MCH: 28.7 pg (ref 26.6–33.0)
MCHC: 31.8 g/dL (ref 31.5–35.7)
MCV: 90 fL (ref 79–97)
Platelets: 282 x10E3/uL (ref 150–450)
RBC: 4.25 x10E6/uL (ref 3.77–5.28)
RDW: 11.8 % (ref 11.7–15.4)
WBC: 4.9 x10E3/uL (ref 3.4–10.8)

## 2024-02-04 LAB — IRON,TIBC AND FERRITIN PANEL
Ferritin: 25 ng/mL (ref 15–150)
Iron Saturation: 15 % (ref 15–55)
Iron: 58 ug/dL (ref 27–159)
Total Iron Binding Capacity: 379 ug/dL (ref 250–450)
UIBC: 321 ug/dL (ref 131–425)

## 2024-02-04 LAB — TSH+FREE T4
Free T4: 1.33 ng/dL (ref 0.82–1.77)
TSH: 3.02 u[IU]/mL (ref 0.450–4.500)

## 2024-02-04 LAB — VITAMIN D 25 HYDROXY (VIT D DEFICIENCY, FRACTURES): Vit D, 25-Hydroxy: 26.7 ng/mL — ABNORMAL LOW (ref 30.0–100.0)

## 2024-02-05 ENCOUNTER — Telehealth: Admitting: Family Medicine

## 2024-02-05 DIAGNOSIS — R109 Unspecified abdominal pain: Secondary | ICD-10-CM | POA: Diagnosis not present

## 2024-02-05 NOTE — Patient Instructions (Signed)
 Lavanda Mealing, thank you for joining Roosvelt Mater, PA-C for today's virtual visit.  While this provider is not your primary care provider (PCP), if your PCP is located in our provider database this encounter information will be shared with them immediately following your visit.   A [facility] MyChart account gives you access to today's visit and all your visits, tests, and labs performed at [facility]  click here if you don't have a [facility] MyChart account or go to [email or web address]  Consent: (Patient) Marcia Gonzalez provided verbal consent for this virtual visit at the beginning of the encounter.  Current Medications:  Current Outpatient Medications:    amphetamine -dextroamphetamine (ADDERALL XR) 10 MG 24 hr capsule, Take 1 capsule (10 mg total) by mouth daily., Disp: 30 capsule, Rfl: 0   citalopram  (CELEXA ) 40 MG tablet, Take 1 tablet (40 mg total) by mouth daily., Disp: 90 tablet, Rfl: 1   clindamycin  (CLEOCIN  T) 1 % SWAB, Apply to affected areas up to twice daily for boils, Disp: 60 each, Rfl: 12   Ferrous Sulfate (IRON PO), Take 1 tablet by mouth daily., Disp: , Rfl:    lamoTRIgine  (LAMICTAL ) 100 MG tablet, Take 1 tablet (100 mg total) by mouth daily., Disp: 90 tablet, Rfl: 1   Multiple Vitamin (MULTIVITAMIN WITH MINERALS) TABS tablet, Take 1 tablet by mouth daily., Disp: , Rfl:    naproxen sodium (ALEVE) 220 MG tablet, Take 220 mg by mouth 2 (two) times daily as needed (pain)., Disp: , Rfl:    nitrofurantoin , macrocrystal-monohydrate, (MACROBID ) 100 MG capsule, Take 1 capsule (100 mg total) by mouth 2 (two) times daily for 7 days. 1 po BId, Disp: 14 capsule, Rfl: 0   ondansetron  (ZOFRAN ) 4 MG tablet, Take 1 tablet (4 mg total) by mouth every 8 (eight) hours as needed., Disp: 30 tablet, Rfl: 1   Semaglutide -Weight Management 1 MG/0.5ML SOAJ, Inject 1 mg into the skin once a week for 28 days., Disp: 2 mL, Rfl: 0   [START ON 03/02/2024] Semaglutide -Weight  Management 1.7 MG/0.75ML SOAJ, Inject 1.7 mg into the skin once a week., Disp: 3 mL, Rfl: 12   [START ON 03/31/2024] Semaglutide -Weight Management 2.4 MG/0.75ML SOAJ, Inject 2.4 mg into the skin once a week., Disp: 3 mL, Rfl: 12   SYNTHROID  88 MCG tablet, Take 1 tablet (88 mcg total) by mouth daily before breakfast., Disp: 90 tablet, Rfl: 3   traZODone  (DESYREL ) 50 MG tablet, Take 1 tablet (50 mg total) by mouth at bedtime., Disp: 90 tablet, Rfl: 1   Vitamin D , Ergocalciferol , (DRISDOL ) 1.25 MG (50000 UNIT) CAPS capsule, Take 1 capsule (50,000 Units total) by mouth every 7 (seven) days., Disp: 12 capsule, Rfl: 3   Vonoprazan Fumarate  (VOQUEZNA ) 10 MG TABS, Take by mouth., Disp: , Rfl:    Medications ordered in this encounter:  No orders of the defined types were placed in this encounter.    *If you need refills on other medications prior to your next appointment, please contact your pharmacy*  Follow-Up: Call back or seek an in-person evaluation if the symptoms worsen or if the condition fails to improve as anticipated.  [facility] [phone]  Other Instructions Abdominal Pain, Adult  Many things can cause belly (abdominal) pain. In most cases, belly pain is not a serious problem and can be watched and treated at home. But in some cases, it can be serious. Your doctor will try to find the cause of your belly pain. Follow these instructions at  home: Medicines Take over-the-counter and prescription medicines only as told by your doctor. Do not take medicines that help you poop (laxatives) unless told by your doctor. General instructions Watch your belly pain for any changes. Tell your doctor if the pain gets worse. Drink enough fluid to keep your pee (urine) pale yellow. Contact a doctor if: Your belly pain changes or gets worse. You have very bad cramping or bloating in your belly. You vomit. Your pain gets worse with meals, after eating, or with certain foods. You  have trouble pooping or have watery poop for more than 2-3 days. You are not hungry, or you lose weight without trying. You have signs of not getting enough fluid or water  (dehydration). These may include: Dark pee, very little pee, or no pee. Cracked lips or dry mouth. Feeling sleepy or weak. You have pain when you pee or poop. Your belly pain wakes you up at night. You have blood in your pee. You have a fever. Get help right away if: You cannot stop vomiting. Your pain is only in one part of your belly, like on the right side. You have bloody or black poop, or poop that looks like tar. You have trouble breathing. You have chest pain. These symptoms may be an emergency. Get help right away. Call 911. Do not wait to see if the symptoms will go away. Do not drive yourself to the hospital. This information is not intended to replace advice given to you by your health care provider. Make sure you discuss any questions you have with your health care provider. Document Revised: 04/22/2022 Document Reviewed: 04/22/2022 Elsevier Patient Education  2024 Elsevier Inc.   If you have been instructed to have an in-person evaluation today at a local Urgent Care facility, please use the link below. It will take you to a list of all of our available [facility], including address, phone number and hours of operation. Please do not delay care.  [facility]  If you or a family member do not have a primary care provider, use the link below to schedule a visit and establish care. When you choose a [facility] primary care physician or advanced practice provider, you gain a long-term partner in health. Find a Primary Care Provider  Learn more about [facility] in-office and virtual care options:  - Get Care Now

## 2024-02-05 NOTE — Progress Notes (Signed)
 Virtual Visit Consent   Marcia Gonzalez, you are scheduled for a virtual visit with a [name] provider today. Just as with appointments in the office, your consent must be obtained to participate. Your consent will be active for this visit and any virtual visit you may have with one of our providers in the next 365 days. If you have a MyChart account, a copy of this consent can be sent to you electronically.  As this is a virtual visit, video technology does not allow for your provider to perform a traditional examination. This may limit your provider's ability to fully assess your condition. If your provider identifies any concerns that need to be evaluated in person or the need to arrange testing (such as labs, EKG, etc.), we will make arrangements to do so. Although advances in technology are sophisticated, we cannot ensure that it will always work on either your end or our end. If the connection with a video visit is poor, the visit may have to be switched to a telephone visit. With either a video or telephone visit, we are not always able to ensure that we have a secure connection.  By engaging in this virtual visit, you consent to the provision of healthcare and authorize for your insurance to be billed (if applicable) for the services provided during this visit. Depending on your insurance coverage, you may receive a charge related to this service.  I need to obtain your verbal consent now. Are you willing to proceed with your visit today? [name] has provided verbal consent on 02/05/2024 for a virtual visit (video or telephone). Marcia Gonzalez, [place]  Date: 02/05/2024 3:23 PM   Virtual Visit via Video Note   I, Marcia Gonzalez, connected with  Shanyah Gattuso  ([id number], [birth year]) on 02/05/24 at  3:15 PM EDT by a video-enabled telemedicine application and verified that I am speaking with the correct person using two identifiers.  Location: Patient: Virtual Visit Location Patient:  Home Provider: Virtual Visit Location Provider: Home Office   I discussed the limitations of evaluation and management by telemedicine and the availability of in person appointments. The patient expressed understanding and agreed to proceed.    History of Present Illness: Marcia Gonzalez is a 28 y.o. who identifies as a female who was assigned female at birth, and is being seen today for c/o having issues since last Friday. Pt c/o lower abdominal pain and not feeling good.  Pt states she had diarrhea and thought she had a stomach virus.  Pt states she started having worsening symptoms with back pain and side and hip pain. Pt states she had similar symptoms before and ended up having a bladder infection.  Pt states she saw her PCP on Wednesday and was prescribed an antibiotic because it was thought that she has a UTI. Pt states she was told her results indicated that she does not have a UTI but is still experiencing pain. Pt denies fever and had nausea but that has passed.  Pt denies constipation.   HPI: HPI  Problems:  Patient Active Problem List   Diagnosis Date Noted   Calculus of gallbladder without cholecystitis without obstruction 07/29/2023   History of ADHD 06/10/2023   Mixed hyperlipidemia 12/16/2022   Psychophysiologic insomnia 10/23/2022   Mixed stress and urge urinary incontinence 09/22/2022   Vasovagal syncope 09/21/2022   Vitamin D  deficiency 09/08/2022   History of suicide attempt: overdose 08/07/2022   Borderline personality disorder (HCC) 08/07/2022   PTSD (post-traumatic stress  disorder) 08/07/2022   Claustrophobia 08/07/2022   IIH (idiopathic intracranial hypertension) 10/21/2021   Major depressive disorder, recurrent episode, moderate rule out bipolar 1 disorder 10/21/2021   Generalized anxiety disorder with panic attacks 06/30/2021   Class 2 severe obesity with serious comorbidity and body mass index (BMI) of 39.0 to 39.9 in adult [facility]) 06/30/2021   Hashimoto's  thyroiditis 10/16/2011    Allergies:  Allergies  Allergen Reactions   Codeine Nausea Only    Codeine based cough syrups    Percocet [Oxycodone -Acetaminophen ] Hives and Itching    Pt tolerates oxycodone  and tylenol  separately    Amoxicillin-Pot Clavulanate Other (See Comments)    Hallucinations   Latex Other (See Comments)    Gets Blisters   Misoprostol      Left blisters in mouth   Pollen Extract Other (See Comments)   Tape Rash    Tolerates paper tape   Medications:  Current Outpatient Medications:    amphetamine -dextroamphetamine (ADDERALL XR) 10 MG 24 hr capsule, Take 1 capsule (10 mg total) by mouth daily., Disp: 30 capsule, Rfl: 0   citalopram  (CELEXA ) 40 MG tablet, Take 1 tablet (40 mg total) by mouth daily., Disp: 90 tablet, Rfl: 1   clindamycin  (CLEOCIN  T) 1 % SWAB, Apply to affected areas up to twice daily for boils, Disp: 60 each, Rfl: 12   Ferrous Sulfate (IRON PO), Take 1 tablet by mouth daily., Disp: , Rfl:    lamoTRIgine  (LAMICTAL ) 100 MG tablet, Take 1 tablet (100 mg total) by mouth daily., Disp: 90 tablet, Rfl: 1   Multiple Vitamin (MULTIVITAMIN WITH MINERALS) TABS tablet, Take 1 tablet by mouth daily., Disp: , Rfl:    naproxen sodium (ALEVE) 220 MG tablet, Take 220 mg by mouth 2 (two) times daily as needed (pain)., Disp: , Rfl:    nitrofurantoin , macrocrystal-monohydrate, (MACROBID ) 100 MG capsule, Take 1 capsule (100 mg total) by mouth 2 (two) times daily for 7 days. 1 po BId, Disp: 14 capsule, Rfl: 0   ondansetron  (ZOFRAN ) 4 MG tablet, Take 1 tablet (4 mg total) by mouth every 8 (eight) hours as needed., Disp: 30 tablet, Rfl: 1   Semaglutide -Weight Management 1 MG/0.5ML SOAJ, Inject 1 mg into the skin once a week for 28 days., Disp: 2 mL, Rfl: 0   [START ON 03/02/2024] Semaglutide -Weight Management 1.7 MG/0.75ML SOAJ, Inject 1.7 mg into the skin once a week., Disp: 3 mL, Rfl: 12   [START ON 03/31/2024] Semaglutide -Weight Management 2.4 MG/0.75ML SOAJ, Inject 2.4 mg  into the skin once a week., Disp: 3 mL, Rfl: 12   SYNTHROID  88 MCG tablet, Take 1 tablet (88 mcg total) by mouth daily before breakfast., Disp: 90 tablet, Rfl: 3   traZODone  (DESYREL ) 50 MG tablet, Take 1 tablet (50 mg total) by mouth at bedtime., Disp: 90 tablet, Rfl: 1   Vitamin D , Ergocalciferol , (DRISDOL ) 1.25 MG (50000 UNIT) CAPS capsule, Take 1 capsule (50,000 Units total) by mouth every 7 (seven) days., Disp: 12 capsule, Rfl: 3   Vonoprazan Fumarate  (VOQUEZNA ) 10 MG TABS, Take by mouth., Disp: , Rfl:   Observations/Objective: Patient is well-developed, well-nourished in no acute distress.  Resting comfortably at home.  Head is normocephalic, atraumatic.  No labored breathing.  Speech is clear and coherent with logical content.  Patient is alert and oriented at baseline.    Assessment and Plan: 1. Abdominal pain, unspecified abdominal location (Primary)  -Pt advised to follow up with in person urgent care or PCP for further evaluation of abdominal pain  -  Pt to continue abx. And follow up with PCP to see if she needs to continue or stop medication since she does not have a UTI -Pt advised if symptoms worsen to follow up in the emergency room.   Follow Up Instructions: I discussed the assessment and treatment plan with the patient. The patient was provided an opportunity to ask questions and all were answered. The patient agreed with the plan and demonstrated an understanding of the instructions.  A copy of instructions were sent to the patient via MyChart unless otherwise noted below.    The patient was advised to call back or seek an in-person evaluation if the symptoms worsen or if the condition fails to improve as anticipated.    Marcia Mater, PA-C

## 2024-02-07 ENCOUNTER — Telehealth: Payer: Self-pay | Admitting: Family Medicine

## 2024-02-07 ENCOUNTER — Encounter: Payer: Self-pay | Admitting: Family Medicine

## 2024-02-07 NOTE — Telephone Encounter (Unsigned)
 Copied from CRM [id number]. Topic: General - Other >> Feb 07, 2024  1:48 PM Ameerah G wrote: Reason for CRM: Pt is calling to check on the status of the PA for Wegovy . I informed her it was insured through [employer] . Pt would like a callback [phone]

## 2024-02-07 NOTE — Telephone Encounter (Signed)
 I spoke to the patient and she states [employer] told her they don't have enough information to approve or deny. Advised pt will route to our PA team to work on this and pt voiced understanding.

## 2024-02-08 ENCOUNTER — Other Ambulatory Visit (HOSPITAL_COMMUNITY): Payer: Self-pay

## 2024-02-08 NOTE — Telephone Encounter (Signed)
 Place a call to [employer] to check the status of the PA.   Per the representative Roselie, the PA is pending review status and the office should receive a determination within 2 business days.   Case ID: [id number] Phone# [phone]

## 2024-02-08 NOTE — Telephone Encounter (Signed)
 Per representative, PA is pending.

## 2024-02-10 ENCOUNTER — Encounter: Payer: Self-pay | Admitting: Family Medicine

## 2024-02-11 ENCOUNTER — Other Ambulatory Visit: Payer: Self-pay | Admitting: Family Medicine

## 2024-02-11 DIAGNOSIS — K219 Gastro-esophageal reflux disease without esophagitis: Secondary | ICD-10-CM

## 2024-02-11 MED ORDER — VOQUEZNA 20 MG PO TABS: 1.0000 | ORAL_TABLET | Freq: Every day | ORAL | 3 refills | Status: AC

## 2024-02-14 ENCOUNTER — Telehealth: Payer: Self-pay

## 2024-02-14 ENCOUNTER — Other Ambulatory Visit (HOSPITAL_COMMUNITY): Payer: Self-pay

## 2024-02-14 NOTE — Telephone Encounter (Signed)
 Pharmacy Patient Advocate Encounter  Received notification from [employer] that Prior Authorization for Voquezna  20 has been APPROVED from 01/15/24 to 02/13/25. Ran test claim, Copay is $200.00 for 90 day supply. This test claim was processed through [facility]- copay amounts may vary at other pharmacies due to pharmacy/plan contracts, or as the patient moves through the different stages of their insurance plan. Tried to find co-pay card but patient would have to enroll [email or web address]   PA #/Case ID/Reference #: [id number]

## 2024-02-14 NOTE — Telephone Encounter (Signed)
 Pharmacy Patient Advocate Encounter   Received notification from CoverMyMeds that prior authorization for Voquezna  20 tabs is required/requested.   Insurance verification completed.   The patient is insured through [employer] .   Per test claim: PA required; PA submitted to above mentioned insurance via CoverMyMeds Key/confirmation #/EOC [facility] Status is pending

## 2024-02-14 NOTE — Telephone Encounter (Signed)
 Pt notified via MyChart message. LS

## 2024-02-15 ENCOUNTER — Other Ambulatory Visit (HOSPITAL_COMMUNITY): Payer: Self-pay

## 2024-02-22 ENCOUNTER — Telehealth: Payer: Self-pay | Admitting: Pharmacy Technician

## 2024-02-22 ENCOUNTER — Other Ambulatory Visit (HOSPITAL_COMMUNITY): Payer: Self-pay

## 2024-02-22 NOTE — Telephone Encounter (Signed)
 Pharmacy Patient Advocate Encounter  Received notification from [employer] that Prior Authorization for Wegovy  has been APPROVED from 01/01/24 to 02/08/25 However, pt's secondary ins [employer] also is requireing a PA.    PA #/Case ID/Reference #: CaseId:[id number]  Continue to telephone note from 02/22/24 for secondary ins PA info. Submitted.

## 2024-02-22 NOTE — Telephone Encounter (Signed)
 Pharmacy Patient Advocate Encounter   Received notification from [facility](pt's pharmacy) that prior authorization for Wegovy  1.7mg  is required/requested.   Insurance verification completed.   The patient is insured through [employer] .   Per test claim: PA required; PA submitted to above mentioned insurance via CoverMyMeds Key/confirmation #/EOC [id number] Status is pending

## 2024-02-24 ENCOUNTER — Other Ambulatory Visit (HOSPITAL_BASED_OUTPATIENT_CLINIC_OR_DEPARTMENT_OTHER): Payer: Self-pay

## 2024-02-24 ENCOUNTER — Other Ambulatory Visit (HOSPITAL_COMMUNITY): Payer: Self-pay

## 2024-02-28 ENCOUNTER — Other Ambulatory Visit (HOSPITAL_COMMUNITY): Payer: Self-pay

## 2024-02-28 NOTE — Telephone Encounter (Signed)
 Approved.

## 2024-02-28 NOTE — Telephone Encounter (Signed)
 Pharmacy Patient Advocate Encounter  Received notification from [employer] [facility] [employer] [facility] that Prior Authorization for Wegovy  1.7mg /0.38ml has been APPROVED from 02/23/24 to 02/22/25   PA #/Case ID/Reference #: [id number]

## 2024-02-29 ENCOUNTER — Encounter: Payer: Self-pay | Admitting: Family Medicine

## 2024-02-29 DIAGNOSIS — E063 Autoimmune thyroiditis: Secondary | ICD-10-CM

## 2024-02-29 DIAGNOSIS — F603 Borderline personality disorder: Secondary | ICD-10-CM

## 2024-02-29 DIAGNOSIS — F410 Panic disorder [episodic paroxysmal anxiety] without agoraphobia: Secondary | ICD-10-CM

## 2024-02-29 DIAGNOSIS — F3341 Major depressive disorder, recurrent, in partial remission: Secondary | ICD-10-CM

## 2024-02-29 NOTE — Telephone Encounter (Signed)
 Appointment scheduled.

## 2024-02-29 NOTE — Telephone Encounter (Signed)
 Luke, can you see if she can move to one of our SD slots on Friday instead for virtual.  This med is controlled and HAS to be done in a visit.

## 2024-03-03 ENCOUNTER — Telehealth (INDEPENDENT_AMBULATORY_CARE_PROVIDER_SITE_OTHER): Admitting: Family Medicine

## 2024-03-03 ENCOUNTER — Encounter: Payer: Self-pay | Admitting: Family Medicine

## 2024-03-03 VITALS — Wt 205.9 lb

## 2024-03-03 DIAGNOSIS — Z87898 Personal history of other specified conditions: Secondary | ICD-10-CM | POA: Diagnosis not present

## 2024-03-03 DIAGNOSIS — F900 Attention-deficit hyperactivity disorder, predominantly inattentive type: Secondary | ICD-10-CM

## 2024-03-03 MED ORDER — SCOPOLAMINE 1 MG/3DAYS TD PT72: 1.0000 | MEDICATED_PATCH | TRANSDERMAL | 0 refills | Status: DC

## 2024-03-03 MED ORDER — AMPHETAMINE-DEXTROAMPHET ER 15 MG PO CP24: 15.0000 mg | ORAL_CAPSULE | ORAL | 0 refills | Status: DC

## 2024-03-03 NOTE — Progress Notes (Signed)
 MyChart Video visit  Subjective: RR:jiyi PCP: Marcia Norene HERO, DO [name] is a 28 y.o. female. Patient provides verbal consent for consult held via video.  Due to COVID-19 pandemic this visit was conducted virtually. This visit type was conducted due to national recommendations for restrictions regarding the COVID-19 Pandemic (e.g. social distancing, sheltering in place) in an effort to limit this patient's exposure and mitigate transmission in our community. All issues noted in this document were discussed and addressed.  A physical exam was not performed with this format.   Location of patient: home Location of provider: [facility] Others present for call: children  1. ADHD Reports improving attention with Adderall XR10mg  but not lasting but a few hours.  Slight increase in anxiety first few days on med, but that resolved.  Denies any other concerning side effects.  Mood now stable.  2. Motion sickness Concerned about up coming boat trip.  Asking for scopalamine patches   ROS: Per HPI  Allergies  Allergen Reactions   Codeine Nausea Only    Codeine based cough syrups    Percocet [Oxycodone -Acetaminophen ] Hives and Itching    Pt tolerates oxycodone  and tylenol  separately    Amoxicillin-Pot Clavulanate Other (See Comments)    Hallucinations   Latex Other (See Comments)    Gets Blisters   Misoprostol      Left blisters in mouth   Pollen Extract Other (See Comments)   Tape Rash    Tolerates paper tape   Past Medical History:  Diagnosis Date   ADHD (attention deficit hyperactivity disorder)    Akathisia 10/05/2022   Allergy    Anemia    Anxiety    Arthritis    Asthma    Binge-eating disorder, in partial remission, moderate 08/07/2022   Chronic hypertension affecting pregnancy 12/03/2021   Dx 12/03/2021 (by today's BP + ^home values); rx Labetalol  200mg  bid; growth q 4wks; 2x/wk testing @ 32wks; IOL 37-39wk   Constipation 08/14/2022   Depression    Encounter for  supervision of high risk pregnancy in second trimester, antepartum 10/21/2021         FAMILY TREE     RESULTS  Language  English  Pap  (needs postpartum)  Initiated care at  13wks  GC/CT  Initial:  -/-          36wks:  Dating by  6wk US         Support person     Genetics  NT/IT: neg/neg    AFP:      Panorama: low risk female  BP cuff  rx'd  Carrier Screen  Neg 05/01/21        [place]/Hgb Elec     Rhogam  n/a        TDaP vaccine  02/11/22  Blood Type  O/Positive/-- (04/04 1619)  Flu v   Endometriosis    GERD (gastroesophageal reflux disease)    Hiatal hernia    History of borderline personality disorder    Hypertension    gestational   Hypothyroidism    IIH (idiopathic intracranial hypertension)    Insomnia 10/23/2022   Missed periods 09/22/2022   Polypharmacy 10/13/2022   Pregnancy examination or test, negative result 09/22/2022   Pregnancy induced hypertension    PTSD (post-traumatic stress disorder)    Suicidal ideation 08/07/2022   UTI (urinary tract infection)    Vaginal delivery 04/05/2022   Vitamin D  deficiency 09/08/2022   Wells' syndrome     Current Outpatient Medications:    amphetamine -dextroamphetamine (ADDERALL  XR) 10 MG 24 hr capsule, Take 1 capsule (10 mg total) by mouth daily., Disp: 30 capsule, Rfl: 0   citalopram  (CELEXA ) 40 MG tablet, Take 1 tablet (40 mg total) by mouth daily., Disp: 90 tablet, Rfl: 1   clindamycin  (CLEOCIN  T) 1 % SWAB, Apply to affected areas up to twice daily for boils, Disp: 60 each, Rfl: 12   Ferrous Sulfate (IRON PO), Take 1 tablet by mouth daily., Disp: , Rfl:    lamoTRIgine  (LAMICTAL ) 100 MG tablet, Take 1 tablet (100 mg total) by mouth daily., Disp: 90 tablet, Rfl: 1   Multiple Vitamin (MULTIVITAMIN WITH MINERALS) TABS tablet, Take 1 tablet by mouth daily., Disp: , Rfl:    naproxen sodium (ALEVE) 220 MG tablet, Take 220 mg by mouth 2 (two) times daily as needed (pain)., Disp: , Rfl:    ondansetron  (ZOFRAN ) 4 MG tablet, Take 1 tablet (4 mg total) by  mouth every 8 (eight) hours as needed., Disp: 30 tablet, Rfl: 1   Semaglutide -Weight Management 1.7 MG/0.75ML SOAJ, Inject 1.7 mg into the skin once a week., Disp: 3 mL, Rfl: 12   [START ON 03/31/2024] Semaglutide -Weight Management 2.4 MG/0.75ML SOAJ, Inject 2.4 mg into the skin once a week., Disp: 3 mL, Rfl: 12   SYNTHROID  88 MCG tablet, Take 1 tablet (88 mcg total) by mouth daily before breakfast., Disp: 90 tablet, Rfl: 3   traZODone  (DESYREL ) 50 MG tablet, Take 1 tablet (50 mg total) by mouth at bedtime., Disp: 90 tablet, Rfl: 1   Vitamin D , Ergocalciferol , (DRISDOL ) 1.25 MG (50000 UNIT) CAPS capsule, Take 1 capsule (50,000 Units total) by mouth every 7 (seven) days., Disp: 12 capsule, Rfl: 3   Vonoprazan Fumarate  (VOQUEZNA ) 20 MG TABS, Take 1 tablet by mouth daily., Disp: 90 tablet, Rfl: 3  Weight 205 lb 14.4 oz (93.4 kg), last menstrual period 02/02/2024, not currently breastfeeding.  Gen: well appearing, obese female, NAD Psych: mood stable, speech normal, affect appropriate. Thought process linear. Good insight. Good eye contact  Assessment/ Plan: 28 y.o. female   Attention deficit hyperactivity disorder (ADHD), predominantly inattentive type - Plan: amphetamine -dextroamphetamine (ADDERALL XR) 15 MG 24 hr capsule  H/O motion sickness - Plan: scopolamine  (TRANSDERM SCOP , 1.5 MG,) 1 MG/3DAYS  ADHD somewhat improving. Adderall dose increased. The Narcotic Database has been reviewed.  There were no red flags.  UTD on UDS/ CSC.  Scopolamine  sent.  Caution side effects  Start time: 12:16pm End time: 12:23pm  Total time spent on patient care (including video visit/ documentation): 10 minutes  Marcia Vernier [name], DO [facility] [phone]

## 2024-03-07 ENCOUNTER — Telehealth: Admitting: Family Medicine

## 2024-03-27 ENCOUNTER — Telehealth (INDEPENDENT_AMBULATORY_CARE_PROVIDER_SITE_OTHER): Admitting: Family Medicine

## 2024-03-27 ENCOUNTER — Encounter: Payer: Self-pay | Admitting: Family Medicine

## 2024-03-27 DIAGNOSIS — J040 Acute laryngitis: Secondary | ICD-10-CM

## 2024-03-27 DIAGNOSIS — F900 Attention-deficit hyperactivity disorder, predominantly inattentive type: Secondary | ICD-10-CM

## 2024-03-27 MED ORDER — AMPHETAMINE-DEXTROAMPHET ER 20 MG PO CP24: 20.0000 mg | ORAL_CAPSULE | ORAL | 0 refills | Status: DC

## 2024-03-27 MED ORDER — AMPHETAMINE-DEXTROAMPHET ER 20 MG PO CP24: 20.0000 mg | ORAL_CAPSULE | Freq: Every day | ORAL | 0 refills | Status: DC

## 2024-03-27 NOTE — Progress Notes (Signed)
 MyChart Video visit  Subjective: RR:JIYI PCP: Marcia Norene HERO, DO [name] is a 28 y.o. female. Patient provides verbal consent for consult held via video.  Due to COVID-19 pandemic this visit was conducted virtually. This visit type was conducted due to national recommendations for restrictions regarding the COVID-19 Pandemic (e.g. social distancing, sheltering in place) in an effort to limit this patient's exposure and mitigate transmission in our community. All issues noted in this document were discussed and addressed.  A physical exam was not performed with this format.   Location of patient: home Location of provider: [facility] Others present for call: spouse and son  1. ADHD Reports improvement in symptoms on 15mg  of adderall XR.  Symptoms are still not totally under control, they tend to start appearing again early afternoon. Wanting to go up 1 more time on dose. No reports of increased anxiety/ insomnia/ dryness  2. URI Reports onset in last couple of days. Has lost her voice. Denies fevers, chills, hemoptysis, SOB or wheezing.  Son and spouse also sick.  Did an at home COVID/Flu test and that was negative.   ROS: Per HPI  Allergies  Allergen Reactions   Codeine Nausea Only    Codeine based cough syrups    Percocet [Oxycodone -Acetaminophen ] Hives and Itching    Pt tolerates oxycodone  and tylenol  separately    Amoxicillin-Pot Clavulanate Other (See Comments)    Hallucinations   Latex Other (See Comments)    Gets Blisters   Misoprostol      Left blisters in mouth   Pollen Extract Other (See Comments)   Tape Rash    Tolerates paper tape   Past Medical History:  Diagnosis Date   ADHD (attention deficit hyperactivity disorder)    Akathisia 10/05/2022   Allergy    Anemia    Anxiety    Arthritis    Asthma    Binge-eating disorder, in partial remission, moderate 08/07/2022   Chronic hypertension affecting pregnancy 12/03/2021   Dx 12/03/2021 (by today's BP +  ^home values); rx Labetalol  200mg  bid; growth q 4wks; 2x/wk testing @ 32wks; IOL 37-39wk   Constipation 08/14/2022   Depression    Encounter for supervision of high risk pregnancy in second trimester, antepartum 10/21/2021         FAMILY TREE     RESULTS  Language  English  Pap  (needs postpartum)  Initiated care at  13wks  GC/CT  Initial:  -/-          36wks:  Dating by  6wk US         Support person     Genetics  NT/IT: neg/neg    AFP:      Panorama: low risk female  BP cuff  rx'd  Carrier Screen  Neg 05/01/21        [place]/Hgb Elec     Rhogam  n/a        TDaP vaccine  02/11/22  Blood Type  O/Positive/-- (04/04 1619)  Flu v   Endometriosis    GERD (gastroesophageal reflux disease)    Hiatal hernia    History of borderline personality disorder    Hypertension    gestational   Hypothyroidism    IIH (idiopathic intracranial hypertension)    Insomnia 10/23/2022   Missed periods 09/22/2022   Polypharmacy 10/13/2022   Pregnancy examination or test, negative result 09/22/2022   Pregnancy induced hypertension    PTSD (post-traumatic stress disorder)    Suicidal ideation 08/07/2022   UTI (urinary tract  infection)    Vaginal delivery 04/05/2022   Vitamin D  deficiency 09/08/2022   Malvina' syndrome     Current Outpatient Medications:    amphetamine -dextroamphetamine (ADDERALL XR) 15 MG 24 hr capsule, Take 1 capsule by mouth every morning. **dose change, Disp: 30 capsule, Rfl: 0   citalopram  (CELEXA ) 40 MG tablet, Take 1 tablet (40 mg total) by mouth daily., Disp: 90 tablet, Rfl: 1   clindamycin  (CLEOCIN  T) 1 % SWAB, Apply to affected areas up to twice daily for boils, Disp: 60 each, Rfl: 12   Ferrous Sulfate (IRON PO), Take 1 tablet by mouth daily., Disp: , Rfl:    lamoTRIgine  (LAMICTAL ) 100 MG tablet, Take 1 tablet (100 mg total) by mouth daily., Disp: 90 tablet, Rfl: 1   Multiple Vitamin (MULTIVITAMIN WITH MINERALS) TABS tablet, Take 1 tablet by mouth daily., Disp: , Rfl:    naproxen sodium (ALEVE)  220 MG tablet, Take 220 mg by mouth 2 (two) times daily as needed (pain)., Disp: , Rfl:    ondansetron  (ZOFRAN ) 4 MG tablet, Take 1 tablet (4 mg total) by mouth every 8 (eight) hours as needed., Disp: 30 tablet, Rfl: 1   scopolamine  (TRANSDERM SCOP , 1.5 MG,) 1 MG/3DAYS, Place 1 patch (1.5 mg total) onto the skin every 3 (three) days., Disp: 4 patch, Rfl: 0   Semaglutide -Weight Management 1.7 MG/0.75ML SOAJ, Inject 1.7 mg into the skin once a week., Disp: 3 mL, Rfl: 12   [START ON 03/31/2024] Semaglutide -Weight Management 2.4 MG/0.75ML SOAJ, Inject 2.4 mg into the skin once a week., Disp: 3 mL, Rfl: 12   SYNTHROID  88 MCG tablet, Take 1 tablet (88 mcg total) by mouth daily before breakfast., Disp: 90 tablet, Rfl: 3   traZODone  (DESYREL ) 50 MG tablet, Take 1 tablet (50 mg total) by mouth at bedtime., Disp: 90 tablet, Rfl: 1   Vitamin D , Ergocalciferol , (DRISDOL ) 1.25 MG (50000 UNIT) CAPS capsule, Take 1 capsule (50,000 Units total) by mouth every 7 (seven) days., Disp: 12 capsule, Rfl: 3   Vonoprazan Fumarate  (VOQUEZNA ) 20 MG TABS, Take 1 tablet by mouth daily., Disp: 90 tablet, Rfl: 3  Gen: tired but nontoxic appearing female HEENT: sclera white, MMM Pulm: no SOB, wheezing or coughing.  Voice is hoarse and strained with talking Psych: mood stable, speech normal, affect appropriate  Assessment/ Plan: 28 y.o. female   Attention deficit hyperactivity disorder (ADHD), predominantly inattentive type - Plan: amphetamine -dextroamphetamine (ADDERALL XR) 20 MG 24 hr capsule, amphetamine -dextroamphetamine (ADDERALL XR) 20 MG 24 hr capsule, amphetamine -dextroamphetamine (ADDERALL XR) 20 MG 24 hr capsule  Laryngitis  Advance dose of Adderall to XR 20mg  daily.  29m follow up virtual visit scheduled  Likely viral URI. Discussed red flags and reasons for abx.  Will contact me if develops any concerning bacterial symptoms.   Start time: attempted to call x2 at 12:30; 2 texts sent 12:30, 12:31pm; attempted  again at 3:15pm End time: 3:22pm  Total time spent on patient care (including video visit/ documentation): 10 minutes  Marcia Kerkman [name], DO [facility] [phone]

## 2024-05-01 ENCOUNTER — Other Ambulatory Visit: Payer: Self-pay | Admitting: Family Medicine

## 2024-05-01 DIAGNOSIS — F3341 Major depressive disorder, recurrent, in partial remission: Secondary | ICD-10-CM

## 2024-05-01 DIAGNOSIS — E063 Autoimmune thyroiditis: Secondary | ICD-10-CM

## 2024-05-01 DIAGNOSIS — F410 Panic disorder [episodic paroxysmal anxiety] without agoraphobia: Secondary | ICD-10-CM

## 2024-05-01 DIAGNOSIS — F603 Borderline personality disorder: Secondary | ICD-10-CM

## 2024-05-02 ENCOUNTER — Telehealth: Payer: Self-pay | Admitting: Family Medicine

## 2024-05-02 NOTE — Telephone Encounter (Signed)
 Referral has been redirected to [facility] at this time.

## 2024-05-02 NOTE — Telephone Encounter (Signed)
 Can you reroute endo referral outside of [place]?

## 2024-05-02 NOTE — Telephone Encounter (Signed)
 Copied from CRM [id number]. Topic: Referral - Question >> May 02, 2024  9:36 AM Myrick T wrote: Reason for CRM: patient called stated the Endo she was referred to stated her labs were normal and they denied seeing her. Patient is asking if she can be referred to another Endo that will see her. Please f/u with patient

## 2024-05-29 ENCOUNTER — Telehealth: Admitting: Physician Assistant

## 2024-05-29 DIAGNOSIS — J3489 Other specified disorders of nose and nasal sinuses: Secondary | ICD-10-CM

## 2024-05-29 MED ORDER — MUPIROCIN 2 % EX OINT: 1.0000 | TOPICAL_OINTMENT | Freq: Two times a day (BID) | CUTANEOUS | 0 refills | Status: DC

## 2024-05-29 NOTE — Progress Notes (Signed)
 E Visit for Rash  We are sorry that you are not feeling well. Here is how we plan to help!  Based on what you have shared with me it appears you have nasal sores/abscess.   I have prescribed Mupirocin ointment. Apply topically to affected areas twice daily.    HOME CARE:  Take cool showers and avoid direct sunlight. Apply cool compress or wet dressings. Take a bath in an oatmeal bath.  Sprinkle content of one Aveeno packet under running faucet with comfortably warm water .  Bathe for 15-20 minutes, 1-2 times daily.  Pat dry with a towel. Do not rub the rash. Use hydrocortisone  cream. Take an antihistamine like Benadryl  for widespread rashes that itch.  The adult dose of Benadryl  is 25-50 mg by mouth 4 times daily. Caution:  This type of medication may cause sleepiness.  Do not drink alcohol, drive, or operate dangerous machinery while taking antihistamines.  Do not take these medications if you have prostate enlargement.  Read package instructions thoroughly on all medications that you take.  GET HELP RIGHT AWAY IF:  Symptoms don't go away after treatment. Severe itching that persists. If you rash spreads or swells. If you rash begins to smell. If it blisters and opens or develops a yellow-brown crust. You develop a fever. You have a sore throat. You become short of breath.  MAKE SURE YOU:  Understand these instructions. Will watch your condition. Will get help right away if you are not doing well or get worse.  Thank you for choosing an e-visit. Your e-visit answers were reviewed by a board certified advanced clinical practitioner to complete your personal care plan. Depending upon the condition, your plan could have included both over the counter or prescription medications. Please review your pharmacy choice. Be sure that the pharmacy you have chosen is open so that you can pick up your prescription now.  If there is a problem you may message your provider in MyChart to have  the prescription routed to another pharmacy. Your safety is important to us . If you have drug allergies check your prescription carefully.  For the next 24 hours, you can use MyChart to ask questions about today's visit, request a non-urgent call back, or ask for a work or school excuse from your e-visit provider. You will get an email in the next two days asking about your experience. I hope that your e-visit has been valuable and will speed your recovery.   I have spent 5 minutes in review of e-visit questionnaire, review and updating patient chart, medical decision making and response to patient.   Delon CHRISTELLA Dickinson, PA-C

## 2024-06-05 ENCOUNTER — Encounter: Payer: Self-pay | Admitting: Family Medicine

## 2024-06-05 ENCOUNTER — Telehealth (INDEPENDENT_AMBULATORY_CARE_PROVIDER_SITE_OTHER): Payer: Self-pay | Admitting: Family Medicine

## 2024-06-05 VITALS — BP 120/70 | Ht 65.0 in | Wt 178.0 lb

## 2024-06-05 DIAGNOSIS — F603 Borderline personality disorder: Secondary | ICD-10-CM

## 2024-06-05 DIAGNOSIS — E063 Autoimmune thyroiditis: Secondary | ICD-10-CM

## 2024-06-05 DIAGNOSIS — F411 Generalized anxiety disorder: Secondary | ICD-10-CM | POA: Diagnosis not present

## 2024-06-05 DIAGNOSIS — B3731 Acute candidiasis of vulva and vagina: Secondary | ICD-10-CM

## 2024-06-05 DIAGNOSIS — D649 Anemia, unspecified: Secondary | ICD-10-CM

## 2024-06-05 DIAGNOSIS — E559 Vitamin D deficiency, unspecified: Secondary | ICD-10-CM

## 2024-06-05 DIAGNOSIS — F3341 Major depressive disorder, recurrent, in partial remission: Secondary | ICD-10-CM

## 2024-06-05 DIAGNOSIS — F410 Panic disorder [episodic paroxysmal anxiety] without agoraphobia: Secondary | ICD-10-CM

## 2024-06-05 DIAGNOSIS — F900 Attention-deficit hyperactivity disorder, predominantly inattentive type: Secondary | ICD-10-CM

## 2024-06-05 MED ORDER — CITALOPRAM HYDROBROMIDE 40 MG PO TABS: 40.0000 mg | ORAL_TABLET | Freq: Every day | ORAL | 1 refills | Status: AC

## 2024-06-05 MED ORDER — FLUCONAZOLE 150 MG PO TABS: 150.0000 mg | ORAL_TABLET | Freq: Once | ORAL | 0 refills | Status: AC

## 2024-06-05 MED ORDER — VITAMIN D (ERGOCALCIFEROL) 1.25 MG (50000 UNIT) PO CAPS: 50000.0000 [IU] | ORAL_CAPSULE | ORAL | 3 refills | Status: AC

## 2024-06-05 MED ORDER — AMPHETAMINE-DEXTROAMPHET ER 20 MG PO CP24: 20.0000 mg | ORAL_CAPSULE | Freq: Every day | ORAL | 0 refills | Status: DC

## 2024-06-05 MED ORDER — AMPHETAMINE-DEXTROAMPHET ER 20 MG PO CP24: 20.0000 mg | ORAL_CAPSULE | ORAL | 0 refills | Status: DC

## 2024-06-05 MED ORDER — LAMOTRIGINE 100 MG PO TABS: 100.0000 mg | ORAL_TABLET | Freq: Every day | ORAL | 1 refills | Status: AC

## 2024-06-05 NOTE — Progress Notes (Signed)
 MyChart Video visit  Subjective: CC: Follow-up ADHD, mood PCP: Marcia Norene HERO, DO [name] is a 28 y.o. female. Patient provides verbal consent for consult held via video.  Due to COVID-19 pandemic this visit was conducted virtually. This visit type was conducted due to national recommendations for restrictions regarding the COVID-19 Pandemic (e.g. social distancing, sheltering in place) in an effort to limit this patient's exposure and mitigate transmission in our community. All issues noted in this document were discussed and addressed.  A physical exam was not performed with this format.   Location of patient: home Location of provider: [facility] Others present for call: spouse and son  She reports compliance with Lamictal , Adderall and Celexa .  Previously under the care of Dr. Meredeth but he has since retired.  She is now under the care of of [facility] [place] [facility] and she does have somebody there that can do medication management but she is actually requesting that we consider starting Lamictal  wean.  She reports symptoms of postpartum depression have improved quite a bit and that she is pretty much depression and anxiety free now.  She feels much better with weight loss and now that her child is a little older  She is tolerating the GLP without difficulty and is injecting 2.4 mg weekly.  Blood pressure has been under excellent control.  Joint pain and musculoskeletal pain in general has gotten better.  She reports no adverse GI side effects including abdominal pain, nausea or vomiting.  She does report some hair thinning and this has persisted despite increasing protein intake.  Would like to have her thyroid  levels checked if possible  Was seen in the ER for an abnormality in her pupil but it was determined that it may be related to use of scopolamine  patch for nausea.  She reports resolution in that and no visual disturbances  She was treated with  medication for staph in her nose and now has a yeast vaginitis and is asking for yeast pills today   ROS: Per HPI  Allergies  Allergen Reactions   Codeine Nausea Only    Codeine based cough syrups    Percocet [Oxycodone -Acetaminophen ] Hives and Itching    Pt tolerates oxycodone  and tylenol  separately    Amoxicillin-Pot Clavulanate Other (See Comments)    Hallucinations   Latex Other (See Comments)    Gets Blisters   Misoprostol      Left blisters in mouth   Pollen Extract Other (See Comments)   Tape Rash    Tolerates paper tape   Past Medical History:  Diagnosis Date   ADHD (attention deficit hyperactivity disorder)    Akathisia 10/05/2022   Allergy    Anemia    Anxiety    Arthritis    Asthma    Binge-eating disorder, in partial remission, moderate 08/07/2022   Chronic hypertension affecting pregnancy 12/03/2021   Dx 12/03/2021 (by today's BP + ^home values); rx Labetalol  200mg  bid; growth q 4wks; 2x/wk testing @ 32wks; IOL 37-39wk   Constipation 08/14/2022   Depression    Encounter for supervision of high risk pregnancy in second trimester, antepartum 10/21/2021         FAMILY TREE     RESULTS  Language  English  Pap  (needs postpartum)  Initiated care at  13wks  GC/CT  Initial:  -/-          36wks:  Dating by  6wk US         Support person  Genetics  NT/IT: neg/neg    AFP:      Panorama: low risk female  BP cuff  rx'd  Carrier Screen  Neg 05/01/21        Tama/Hgb Elec     Rhogam  n/a        TDaP vaccine  02/11/22  Blood Type  O/Positive/-- (04/04 1619)  Flu v   Endometriosis    GERD (gastroesophageal reflux disease)    Hiatal hernia    History of borderline personality disorder    Hypertension    gestational   Hypothyroidism    IIH (idiopathic intracranial hypertension)    Insomnia 10/23/2022   Missed periods 09/22/2022   Polypharmacy 10/13/2022   Pregnancy examination or test, negative result 09/22/2022   Pregnancy induced hypertension    PTSD (post-traumatic stress  disorder)    Suicidal ideation 08/07/2022   UTI (urinary tract infection)    Vaginal delivery 04/05/2022   Vitamin D  deficiency 09/08/2022   Wells' syndrome     Current Outpatient Medications:    amphetamine -dextroamphetamine (ADDERALL XR) 20 MG 24 hr capsule, Take 1 capsule (20 mg total) by mouth every morning. **dose change, Disp: 30 capsule, Rfl: 0   amphetamine -dextroamphetamine (ADDERALL XR) 20 MG 24 hr capsule, Take 1 capsule (20 mg total) by mouth daily., Disp: 30 capsule, Rfl: 0   amphetamine -dextroamphetamine (ADDERALL XR) 20 MG 24 hr capsule, Take 1 capsule (20 mg total) by mouth daily., Disp: 30 capsule, Rfl: 0   citalopram  (CELEXA ) 40 MG tablet, Take 1 tablet (40 mg total) by mouth daily., Disp: 90 tablet, Rfl: 1   clindamycin  (CLEOCIN  T) 1 % SWAB, Apply to affected areas up to twice daily for boils, Disp: 60 each, Rfl: 12   Ferrous Sulfate (IRON PO), Take 1 tablet by mouth daily., Disp: , Rfl:    lamoTRIgine  (LAMICTAL ) 100 MG tablet, Take 1 tablet (100 mg total) by mouth daily., Disp: 90 tablet, Rfl: 1   Multiple Vitamin (MULTIVITAMIN WITH MINERALS) TABS tablet, Take 1 tablet by mouth daily., Disp: , Rfl:    mupirocin ointment (BACTROBAN) 2 %, Apply 1 Application topically 2 (two) times daily., Disp: 22 g, Rfl: 0   naproxen sodium (ALEVE) 220 MG tablet, Take 220 mg by mouth 2 (two) times daily as needed (pain)., Disp: , Rfl:    ondansetron  (ZOFRAN ) 4 MG tablet, Take 1 tablet (4 mg total) by mouth every 8 (eight) hours as needed., Disp: 30 tablet, Rfl: 1   scopolamine  (TRANSDERM SCOP , 1.5 MG,) 1 MG/3DAYS, Place 1 patch (1.5 mg total) onto the skin every 3 (three) days., Disp: 4 patch, Rfl: 0   Semaglutide -Weight Management 2.4 MG/0.75ML SOAJ, Inject 2.4 mg into the skin once a week., Disp: 3 mL, Rfl: 12   SYNTHROID  88 MCG tablet, Take 1 tablet (88 mcg total) by mouth daily before breakfast., Disp: 90 tablet, Rfl: 3   Vitamin D , Ergocalciferol , (DRISDOL ) 1.25 MG (50000 UNIT) CAPS  capsule, Take 1 capsule (50,000 Units total) by mouth every 7 (seven) days., Disp: 12 capsule, Rfl: 3   Vonoprazan Fumarate  (VOQUEZNA ) 20 MG TABS, Take 1 tablet by mouth daily., Disp: 90 tablet, Rfl: 3  Gen: well appearing female, NAD HEENT: no exophthalmos, sclera white  Psych: mood happy, pleasant and interactive     06/05/2024    3:52 PM 02/02/2024    2:20 PM 12/07/2023    1:49 PM  Depression screen PHQ 2/9  Decreased Interest 0 1 0  Down, Depressed, Hopeless 0 1 0  PHQ - 2 Score 0 2 0  Altered sleeping  0 3  Tired, decreased energy  3 3  Change in appetite  0 1  Feeling bad or failure about yourself   1 3  Trouble concentrating  3 3  Moving slowly or fidgety/restless  0 0  Suicidal thoughts  0 0  PHQ-9 Score  9  13   Difficult doing work/chores  Very difficult      Data saved with a previous flowsheet row definition      06/05/2024    3:53 PM 02/02/2024    2:23 PM 12/07/2023    1:52 PM 11/12/2023    1:33 PM  GAD 7 : Generalized Anxiety Score  Nervous, Anxious, on Edge 0 1 1 0  Control/stop worrying 0 0 1 0  Worry too much - different things 0 0 1 0  Trouble relaxing 1 0 1 0  Restless 0 0 0 0  Easily annoyed or irritable 0 0 1 0  Afraid - awful might happen 0 0 0 0  Total GAD 7 Score 1 1 5  0  Anxiety Difficulty Not difficult at all Somewhat difficult Somewhat difficult      Assessment/ Plan: 28 y.o. female   Attention deficit hyperactivity disorder (ADHD), predominantly inattentive type - Plan: amphetamine -dextroamphetamine (ADDERALL XR) 20 MG 24 hr capsule, amphetamine -dextroamphetamine (ADDERALL XR) 20 MG 24 hr capsule, amphetamine -dextroamphetamine (ADDERALL XR) 20 MG 24 hr capsule  Borderline personality disorder (HCC) - Plan: citalopram  (CELEXA ) 40 MG tablet, lamoTRIgine  (LAMICTAL ) 100 MG tablet  Generalized anxiety disorder with panic attacks - Plan: citalopram  (CELEXA ) 40 MG tablet  Recurrent major depressive disorder in partial remission - Plan:  citalopram  (CELEXA ) 40 MG tablet, lamoTRIgine  (LAMICTAL ) 100 MG tablet  Yeast vaginitis - Plan: fluconazole  (DIFLUCAN ) 150 MG tablet  Vitamin D  deficiency - Plan: Vitamin D , Ergocalciferol , (DRISDOL ) 1.25 MG (50000 UNIT) CAPS capsule, VITAMIN D  25 Hydroxy (Vit-D Deficiency, Fractures)  Hashimoto's thyroiditis - Plan: TSH + free T4  Anemia, unspecified type - Plan: Anemia panel  Placed orders for nonfasting labs to follow-up on vitamin D , thyroid  levels and anemia.  She still had a hemoglobin of around 9.6 when seen in the ER in September.  Her ADHD is stable.  She is having a little bit of a delayed response with the extended release I presume this is due to the use of the GLP.  However, she did not want to switch off to something like Jornay because she does have a good response once the medication has kicked in  She is going to try half dose of Lamictal  as she is interested in trying to wean from that medicine.  She will continue Celexa .  Continue counseling services at the [facility] in [facility].  Diflucan  sent for yeast vaginitis  Vitamin D  was renewed  Check thyroid  levels given reports of hair loss.  Though again this may be due to weight loss.  Start time: 3:45pm End time: 4:07pm  Total time spent on patient care (including video visit/ documentation): 30 minutes  Marcia Gonzalez [name], DO [facility] [phone]

## 2024-06-06 ENCOUNTER — Encounter: Admitting: Family Medicine

## 2024-06-07 ENCOUNTER — Other Ambulatory Visit

## 2024-06-12 ENCOUNTER — Other Ambulatory Visit

## 2024-06-12 DIAGNOSIS — E063 Autoimmune thyroiditis: Secondary | ICD-10-CM

## 2024-06-12 DIAGNOSIS — D649 Anemia, unspecified: Secondary | ICD-10-CM

## 2024-06-12 DIAGNOSIS — E559 Vitamin D deficiency, unspecified: Secondary | ICD-10-CM

## 2024-06-13 ENCOUNTER — Ambulatory Visit: Payer: Self-pay | Admitting: Family Medicine

## 2024-06-13 DIAGNOSIS — D509 Iron deficiency anemia, unspecified: Secondary | ICD-10-CM

## 2024-06-13 LAB — ANEMIA PANEL
Ferritin: 14 ng/mL — AB (ref 15–150)
Folate, Hemolysate: 393 ng/mL
Folate, RBC: 1031 ng/mL (ref >498)
Hematocrit: 38.1 % (ref 34.0–46.6)
Iron Saturation: 10 % — AB (ref 15–55)
Iron: 40 ug/dL (ref 27–159)
Retic Ct Pct: 0.5 % — ABNORMAL LOW (ref 0.6–2.6)
Total Iron Binding Capacity: 406 ug/dL (ref 250–450)
UIBC: 366 ug/dL (ref 131–425)
Vitamin B-12: 344 pg/mL (ref 232–1245)

## 2024-06-13 LAB — VITAMIN D 25 HYDROXY (VIT D DEFICIENCY, FRACTURES): Vit D, 25-Hydroxy: 47.8 ng/mL (ref 30.0–100.0)

## 2024-06-13 LAB — TSH+FREE T4
Free T4: 1.27 ng/dL (ref 0.82–1.77)
TSH: 2.33 u[IU]/mL (ref 0.450–4.500)

## 2024-06-24 ENCOUNTER — Encounter: Payer: Self-pay | Admitting: Family Medicine

## 2024-07-05 ENCOUNTER — Ambulatory Visit: Admitting: Dermatology

## 2024-07-05 ENCOUNTER — Encounter: Payer: Self-pay | Admitting: Dermatology

## 2024-07-05 VITALS — BP 132/91 | HR 85

## 2024-07-05 DIAGNOSIS — L732 Hidradenitis suppurativa: Secondary | ICD-10-CM | POA: Diagnosis not present

## 2024-07-05 DIAGNOSIS — L0292 Furuncle, unspecified: Secondary | ICD-10-CM

## 2024-07-05 MED ORDER — MUPIROCIN 2 % EX OINT: 1.0000 | TOPICAL_OINTMENT | Freq: Two times a day (BID) | CUTANEOUS | 6 refills | Status: AC

## 2024-07-05 MED ORDER — CLINDAMYCIN PHOSPHATE 1 % EX SWAB: CUTANEOUS | 10 refills | Status: AC

## 2024-07-05 MED ORDER — DOXYCYCLINE HYCLATE 100 MG PO CAPS: ORAL_CAPSULE | ORAL | 3 refills | Status: AC

## 2024-07-05 NOTE — Patient Instructions (Addendum)
 VISIT SUMMARY:  Today, we discussed the management of your hidradenitis suppurativa, a chronic skin condition that causes painful bumps and flares. You experience flares every couple of months, mainly affecting your buttocks, intergluteal cleft, and labial areas. We reviewed your current hygiene routine and past treatments, and discussed potential triggers and new treatment options.  YOUR PLAN:  -HIDRADENITIS SUPPURATIVA:  Hidradenitis suppurativa is a chronic skin condition that causes painful bumps and flares, often in areas where skin rubs together.   You will continue using benzoyl peroxide wash daily on the affected areas. During flares, you should use mupirocin  ointment and take doxycycline  with food, followed by a probiotic.   Epsom salt baths can also help soothe the affected areas.   We discussed potential triggers like hormonal changes, stress, diet, and smoking, and noted that dairy and sugar might trigger your flares. We also talked about the possibility of using biologics like Cosentyx in the future if your condition worsens.  INSTRUCTIONS:  Please continue with your current hygiene routine and follow the new treatment plan as discussed. Take doxycycline  with food and follow it with a probiotic during flares. Use mupirocin  ointment and consider Epsom salt baths for relief. We will reassess your condition and perform a skin check in four months.        Important Information  Due to recent changes in healthcare laws, you may see results of your pathology and/or laboratory studies on MyChart before the doctors have had a chance to review them. We understand that in some cases there may be results that are confusing or concerning to you. Please understand that not all results are received at the same time and often the doctors may need to interpret multiple results in order to provide you with the best plan of care or course of treatment. Therefore, we ask that you please give  us  2 business days to thoroughly review all your results before contacting the office for clarification. Should we see a critical lab result, you will be contacted sooner.   If You Need Anything After Your Visit  If you have any questions or concerns for your doctor, please call our main line at [phone] If no one answers, please leave a voicemail as directed and we will return your call as soon as possible. Messages left after 4 pm will be answered the following business day.   You may also send us  a message via MyChart. We typically respond to MyChart messages within 1-2 business days.  For prescription refills, please ask your pharmacy to contact our office. Our fax number is [phone].  If you have an urgent issue when the clinic is closed that cannot wait until the next business day, you can page your doctor at the number below.    Please note that while we do our best to be available for urgent issues outside of office hours, we are not available 24/7.   If you have an urgent issue and are unable to reach us , you may choose to seek medical care at your doctor's office, retail clinic, urgent care center, or emergency room.  If you have a medical emergency, please immediately call 911 or go to the emergency department. In the event of inclement weather, please call our main line at [phone] for an update on the status of any delays or closures.  Dermatology Medication Tips: Please keep the boxes that topical medications come in in order to help keep track of the instructions about where and how to  use these. Pharmacies typically print the medication instructions only on the boxes and not directly on the medication tubes.   If your medication is too expensive, please contact our office at [phone] or send us  a message through MyChart.   We are unable to tell what your co-pay for medications will be in advance as this is different depending on your insurance coverage.  However, we may be able to find a substitute medication at lower cost or fill out paperwork to get insurance to cover a needed medication.   If a prior authorization is required to get your medication covered by your insurance company, please allow us  1-2 business days to complete this process.  Drug prices often vary depending on where the prescription is filled and some pharmacies may offer cheaper prices.  The website www.goodrx.com contains coupons for medications through different pharmacies. The prices here do not account for what the cost may be with help from insurance (it may be cheaper with your insurance), but the website can give you the price if you did not use any insurance.  - You can print the associated coupon and take it with your prescription to the pharmacy.  - You may also stop by our office during regular business hours and pick up a GoodRx coupon card.  - If you need your prescription sent electronically to a different pharmacy, notify our office through [facility] or by phone at [phone]

## 2024-07-05 NOTE — Progress Notes (Signed)
° °  New Patient Visit  Patient (and/or pt guardian) consented to the use of AI-assisted tools for note generation.    Subjective  Marcia Gonzalez is a 28 y.o. female who presents for the following: Hidradenitis Suppurativa - patient is accompanied by husband Inge  Located at the buttocks and Vagina that she would like to have examined. Patient reports the areas have been there for 3 years She reports the areas are bothersome. Patient rates irritation 9 out of 10 during a flare She states that the areas have not spread.  Patient reports she has previously been treated for these areas.  Patient was previously prescribed Clindamycin  swabs 1% in July.  Patient reports that she forgets to use the swabs and does not use them often Patient reports she has drained cysts in the past  Patient reports areas sometimes become large and painful and drain on their own Patient reports she currently uses Aveeno healing soap Patient reports she currently does not use any topicals on the area but has used neosporin in the past on the areas Patient reports she does not currently have a flare   The following portions of the chart were reviewed this encounter and updated as appropriate: medications, allergies, medical history  Review of Systems:  No other skin or systemic complaints except as noted in HPI or Assessment and Plan.  Objective  Well appearing patient in no apparent distress; mood and affect are within normal limits.  A focused examination was performed of the following areas: Buttocks  Relevant exam findings are noted in the Assessment and Plan.    Assessment & Plan  HIDRADENITIS SUPPURATIVA [place] Stage 1 Exam: Buttocks and Vagina  Hidradenitis Suppurativa is a chronic; persistent; non-curable, but treatable condition due to abnormal inflamed sweat glands in the body folds (axilla, inframammary, groin, medial thighs), causing recurrent painful draining cysts and scarring. It can  be associated with severe scarring acne and cysts; also abscesses and scarring of scalp. The goal is control and prevention of flares, as it is not curable. Scars are permanent and can be thickened. Treatment may include daily use of topical medication and oral antibiotics.  Oral isotretinoin may also be helpful.  For some cases, Humira or Cosentyx (biologic injections) may be prescribed to decrease the inflammatory process and prevent flares.  When indicated, inflamed cysts may also be treated surgically.  Treatment Plan: Recommended use of Benzoyl Peroxide 4% wash such as Panoxyl or CeraVe with Benzoyl Peroxide Prescribed Mupirocin  2% ointment to use twice daily at sign of a flare Prescribed Clindamycin  1% swabs to use daily on the area  Prescribed Doxycycline  100 mg - take one tablet daily with meal at sign of flare for 10 days Recommended Vitamin C supplement  Recommended epsom salt bath at sign of flare HIDRADENITIS SUPPURATIVA   RECURRENT BOILS    Return in about 4 months (around 11/03/2024) for TBSE and HS F/U.  LILLETTE Lyle Cords, am acting as a neurosurgeon for [employer], DO .   Documentation: I have reviewed the above documentation for accuracy and completeness, and I agree with the above.  Delon Lenis, DO

## 2024-07-14 ENCOUNTER — Telehealth: Admitting: Family Medicine

## 2024-07-14 DIAGNOSIS — R112 Nausea with vomiting, unspecified: Secondary | ICD-10-CM | POA: Diagnosis not present

## 2024-07-14 MED ORDER — PROMETHAZINE HCL 25 MG PO TABS: 25.0000 mg | ORAL_TABLET | Freq: Two times a day (BID) | ORAL | 0 refills | Status: DC

## 2024-07-14 NOTE — Progress Notes (Signed)

## 2024-07-24 ENCOUNTER — Encounter: Payer: Self-pay | Admitting: Dermatology

## 2024-07-24 NOTE — Telephone Encounter (Signed)
 Hi Marcia Gonzalez,  We sae this pt together for HS on 12/17, please let her know this is an issue that needs to be addressed during an office visit.  Pt messages are for discussing questions regarding current medical treatments.We can definitely discuss at the f/u visit in April or she can try calling to get a sooner appointment.  Thanks!

## 2024-07-25 ENCOUNTER — Telehealth: Payer: Self-pay | Admitting: Dermatology

## 2024-07-25 NOTE — Telephone Encounter (Signed)
 Spoke with patient to inform her that we would be happy to discuss the issue she is having with her nails at her next scheduled appointment and that she could be put on a waiting list to potentially be seen sooner if she would like. Patient was informed that MyChart discussions and questions are limited to current treatments. Issues that have not previously been treated will need an appointment to be evaluated. Patient showed understanding and would like to come in sooner if an appointment is available.

## 2024-08-02 ENCOUNTER — Encounter: Payer: Self-pay | Admitting: Family Medicine

## 2024-08-03 ENCOUNTER — Ambulatory Visit: Admitting: Family Medicine

## 2024-08-03 ENCOUNTER — Encounter: Payer: Self-pay | Admitting: Family Medicine

## 2024-08-03 VITALS — BP 129/84 | HR 90 | Temp 97.3°F | Ht 65.0 in | Wt 165.2 lb

## 2024-08-03 DIAGNOSIS — J3489 Other specified disorders of nose and nasal sinuses: Secondary | ICD-10-CM

## 2024-08-03 DIAGNOSIS — Z23 Encounter for immunization: Secondary | ICD-10-CM

## 2024-08-03 DIAGNOSIS — B379 Candidiasis, unspecified: Secondary | ICD-10-CM | POA: Diagnosis not present

## 2024-08-03 DIAGNOSIS — T3695XA Adverse effect of unspecified systemic antibiotic, initial encounter: Secondary | ICD-10-CM | POA: Diagnosis not present

## 2024-08-03 MED ORDER — MUPIROCIN 2 % EX OINT: 1.0000 | TOPICAL_OINTMENT | Freq: Two times a day (BID) | CUTANEOUS | 0 refills | Status: AC

## 2024-08-03 MED ORDER — FLUCONAZOLE 150 MG PO TABS: 150.0000 mg | ORAL_TABLET | Freq: Once | ORAL | 0 refills | Status: AC

## 2024-08-03 NOTE — Telephone Encounter (Signed)
 Pt scheduled. Ls

## 2024-08-03 NOTE — Progress Notes (Signed)
" ° °  Acute Office Visit  Subjective:     Patient ID: Marcia Gonzalez, female    DOB: April 24, 1996, 29 y.o.   MRN: [id number]  Chief Complaint  Patient presents with   nasal sore    HPI  History of Present Illness   Marcia Gonzalez is a 29 year old female who presents with a sore in her nose.  Nasal lesion - Sore in the nose began yesterday - Described as raw with some bleeding and swelling, worse on the left side - Lesion is not the same color as previous occurrences but similar in nature - Some drainage and mild pain on the right side of the nose - Previously used mupirocin  cream for similar sores with resolution - No associated cough, congestion, or fever  Request for antifungal medication - Requests Diflucan  for antibiotic induced yeast infections - Experiences symptoms even with use of topical antibiotic  Medication availability - Has oral antibiotics at home for 'HS' flares if needed.       ROS As per HPI.      Objective:    BP 129/84   Pulse 90   Temp (!) 97.3 F (36.3 C) (Temporal)   Ht 5' 5 (1.651 m)   Wt 165 lb 3.2 oz (74.9 kg)   SpO2 96%   BMI 27.49 kg/m    Physical Exam Vitals and nursing note reviewed.  Constitutional:      General: She is not in acute distress.    Appearance: She is not ill-appearing, toxic-appearing or diaphoretic.  HENT:     Nose: Nasal tenderness (with mild swelling and erythema in bilateral nostrils. No purulent drainage) present.  Skin:    General: Skin is warm and dry.  Neurological:     Mental Status: She is alert and oriented to person, place, and time.  Psychiatric:        Mood and Affect: Mood normal.        Behavior: Behavior normal.     No results found for any visits on 08/03/24.      Assessment & Plan:   Marcia Gonzalez was seen today for nasal sore.  Diagnoses and all orders for this visit:  Nasal vestibulitis -     mupirocin  ointment (BACTROBAN ) 2 %; Apply 1 Application topically 2 (two) times daily for 7  days.  Antibiotic-induced yeast infection -     fluconazole  (DIFLUCAN ) 150 MG tablet; Take 1 tablet (150 mg total) by mouth once for 1 dose. May repeat in 3 days if symptoms persist  Encounter for immunization -     Flu vaccine trivalent PF, 6mos and older(Flulaval,Afluria,Fluarix,Fluzone)   Assessment and Plan    Nasal sore Symptoms started yesterday. No purulent drainage on exam. Previous episodes resolved with mupirocin  cream. - Apply mupirocin  ointment twice daily for one week. - Contact for oral antibiotics if no improvement  Antibiotic-induced candidiasis Susceptible to yeast infections after antibiotics - Prescribed Diflucan ; take one dose if needed and repeat in three days if symptoms persist.      Flu vaccine today.   Return to office for new or worsening symptoms, or if symptoms persist.   The patient indicates understanding of these issues and agrees with the plan.  Marcia CHRISTELLA Search, FNP   "

## 2024-08-16 ENCOUNTER — Encounter: Payer: Self-pay | Admitting: Family Medicine

## 2024-08-16 ENCOUNTER — Telehealth: Admitting: Family Medicine

## 2024-08-16 DIAGNOSIS — F900 Attention-deficit hyperactivity disorder, predominantly inattentive type: Secondary | ICD-10-CM

## 2024-08-16 MED ORDER — AMPHETAMINE-DEXTROAMPHET ER 25 MG PO CP24: 25.0000 mg | ORAL_CAPSULE | ORAL | 0 refills | Status: AC

## 2024-08-16 MED ORDER — AMPHETAMINE-DEXTROAMPHET ER 25 MG PO CP24: 25.0000 mg | ORAL_CAPSULE | Freq: Every day | ORAL | 0 refills | Status: AC

## 2024-08-16 NOTE — Progress Notes (Signed)
 MyChart Video visit  Subjective: CC: Follow-up ADHD PCP: [name], DO [name] is a 29 y.o. female. Patient provides verbal consent for consult held via video.  Due to COVID-19 pandemic this visit was conducted virtually. This visit type was conducted due to national recommendations for restrictions regarding the COVID-19 Pandemic (e.g. social distancing, sheltering in place) in an effort to limit this patient's exposure and mitigate transmission in our community. All issues noted in this document were discussed and addressed.  A physical exam was not performed with this format.   Location of patient: home Location of provider: [facility] Others present for call: none  1.  ADHD She reports that when she takes the medication initially it is very effective and then it seems to wear off after several hours.  She denies any adverse side effects including tremors, heart palpitations, increased anxiety.  Continues to see her counselor about once weekly and that seems to be helping and she is hoping that they can work on other ways to help with her ADHD then just medication.     ROS: Per HPI  Allergies[1] Past Medical History:  Diagnosis Date   ADHD (attention deficit hyperactivity disorder)    Akathisia 10/05/2022   Allergy    Anemia    Anxiety    Arthritis    Asthma    Binge-eating disorder, in partial remission, moderate 08/07/2022   Chronic hypertension affecting pregnancy 12/03/2021   Dx 12/03/2021 (by today's BP + ^home values); rx Labetalol  200mg  bid; growth q 4wks; 2x/wk testing @ 32wks; IOL 37-39wk   Constipation 08/14/2022   Depression    Encounter for supervision of high risk pregnancy in second trimester, antepartum 10/21/2021         FAMILY TREE     RESULTS  Language  English  Pap  (needs postpartum)  Initiated care at  13wks  GC/CT  Initial:  -/-          36wks:  Dating by  6wk US         Support person     Genetics  NT/IT: neg/neg    AFP:      Panorama: low  risk female  BP cuff  rx'd  Carrier Screen  Neg 05/01/21        Cutler/Hgb Elec     Rhogam  n/a        TDaP vaccine  02/11/22  Blood Type  O/Positive/-- (04/04 1619)  Flu v   Endometriosis    GERD (gastroesophageal reflux disease)    Hiatal hernia    History of borderline personality disorder    Hypertension    gestational   Hypothyroidism    IIH (idiopathic intracranial hypertension)    Insomnia 10/23/2022   Missed periods 09/22/2022   Polypharmacy 10/13/2022   Pregnancy examination or test, negative result 09/22/2022   Pregnancy induced hypertension    PTSD (post-traumatic stress disorder)    Suicidal ideation 08/07/2022   UTI (urinary tract infection)    Vaginal delivery 04/05/2022   Vitamin D  deficiency 09/08/2022   Wells' syndrome    Current Medications[2]  Gen: well appearing female, NAD Psych: Mood stable, speech normal, affect appropriate.  Very pleasant, interactive  Assessment/ Plan: 29 y.o. female   Attention deficit hyperactivity disorder (ADHD), predominantly inattentive type - Plan: amphetamine -dextroamphetamine (ADDERALL XR) 25 MG 24 hr capsule, amphetamine -dextroamphetamine (ADDERALL XR) 25 MG 24 hr capsule, amphetamine -dextroamphetamine (ADDERALL XR) 25 MG 24 hr capsule  Up-to-date on UDS and CSC and the national narcotic  database reviewed with no red flags.  I have gone ahead and increase her Adderall to 25 mg daily.  Will reassess her again in 3 months and she is aware of date and time of appointment.  She will reach out to me should she have any adverse side effects prior to that time.  Start time: 3:26pm End time: 3:34pm  Total time spent on patient care (including video visit/ documentation): 10 minutes  Delecia Vastine M Brittiany Wiehe, DO [facility] [phone]      [1]  Allergies Allergen Reactions   Codeine Nausea Only    Codeine based cough syrups    Percocet [Oxycodone -Acetaminophen ] Hives and Itching    Pt tolerates oxycodone  and  tylenol  separately    Amoxicillin-Pot Clavulanate Other (See Comments)    Hallucinations   Latex Other (See Comments)    Gets Blisters   Misoprostol      Left blisters in mouth   Pollen Extract Other (See Comments)   Tape Rash    Tolerates paper tape  [2]  Current Outpatient Medications:    amphetamine -dextroamphetamine (ADDERALL XR) 20 MG 24 hr capsule, Take 1 capsule (20 mg total) by mouth every morning., Disp: 30 capsule, Rfl: 0   amphetamine -dextroamphetamine (ADDERALL XR) 20 MG 24 hr capsule, Take 1 capsule (20 mg total) by mouth daily., Disp: 30 capsule, Rfl: 0   amphetamine -dextroamphetamine (ADDERALL XR) 20 MG 24 hr capsule, Take 1 capsule (20 mg total) by mouth daily., Disp: 30 capsule, Rfl: 0   citalopram  (CELEXA ) 40 MG tablet, Take 1 tablet (40 mg total) by mouth daily., Disp: 90 tablet, Rfl: 1   clindamycin  (CLEOCIN  T) 1 % SWAB, Apply to affected areas daily, Disp: 60 each, Rfl: 10   doxycycline  (VIBRAMYCIN ) 100 MG capsule, Take one tablet daily with meals for 10 days at sign of flare, Disp: 30 capsule, Rfl: 3   Ferrous Sulfate (IRON PO), Take 1 tablet by mouth daily., Disp: , Rfl:    lamoTRIgine  (LAMICTAL ) 100 MG tablet, Take 1 tablet (100 mg total) by mouth daily., Disp: 90 tablet, Rfl: 1   Multiple Vitamin (MULTIVITAMIN WITH MINERALS) TABS tablet, Take 1 tablet by mouth daily., Disp: , Rfl:    mupirocin  ointment (BACTROBAN ) 2 %, Apply 1 Application topically 2 (two) times daily., Disp: 30 g, Rfl: 6   naproxen sodium (ALEVE) 220 MG tablet, Take 220 mg by mouth 2 (two) times daily as needed (pain)., Disp: , Rfl:    ondansetron  (ZOFRAN ) 4 MG tablet, Take 1 tablet (4 mg total) by mouth every 8 (eight) hours as needed., Disp: 30 tablet, Rfl: 1   Semaglutide -Weight Management 2.4 MG/0.75ML SOAJ, Inject 2.4 mg into the skin once a week., Disp: 3 mL, Rfl: 12   SYNTHROID  88 MCG tablet, Take 1 tablet (88 mcg total) by mouth daily before breakfast., Disp: 90 tablet, Rfl: 3   Vitamin D ,  Ergocalciferol , (DRISDOL ) 1.25 MG (50000 UNIT) CAPS capsule, Take 1 capsule (50,000 Units total) by mouth every 7 (seven) days., Disp: 12 capsule, Rfl: 3   Vonoprazan Fumarate  (VOQUEZNA ) 20 MG TABS, Take 1 tablet by mouth daily., Disp: 90 tablet, Rfl: 3

## 2024-11-01 ENCOUNTER — Ambulatory Visit: Admitting: Family Medicine

## 2024-11-15 ENCOUNTER — Ambulatory Visit: Admitting: Dermatology

## 2025-02-05 ENCOUNTER — Encounter: Payer: Self-pay | Admitting: Family Medicine
